# Patient Record
Sex: Female | Born: 1945 | Race: White | Hispanic: No | Smoking: Never smoker
Health system: Southern US, Community
[De-identification: ages and names within clinical notes are randomized; demographics above are authoritative.]

## PROBLEM LIST (undated history)

## (undated) DIAGNOSIS — I779 Disorder of arteries and arterioles, unspecified: Secondary | ICD-10-CM

## (undated) DIAGNOSIS — I2510 Atherosclerotic heart disease of native coronary artery without angina pectoris: Secondary | ICD-10-CM

## (undated) DIAGNOSIS — I10 Essential (primary) hypertension: Secondary | ICD-10-CM

## (undated) DIAGNOSIS — I639 Cerebral infarction, unspecified: Secondary | ICD-10-CM

## (undated) DIAGNOSIS — E119 Type 2 diabetes mellitus without complications: Secondary | ICD-10-CM

## (undated) DIAGNOSIS — E785 Hyperlipidemia, unspecified: Secondary | ICD-10-CM

## (undated) DIAGNOSIS — I480 Paroxysmal atrial fibrillation: Secondary | ICD-10-CM

## (undated) DIAGNOSIS — I739 Peripheral vascular disease, unspecified: Secondary | ICD-10-CM

## (undated) DIAGNOSIS — L989 Disorder of the skin and subcutaneous tissue, unspecified: Secondary | ICD-10-CM

## (undated) HISTORY — DX: Essential (primary) hypertension: I10

## (undated) HISTORY — PX: HERNIA REPAIR: SHX51

## (undated) HISTORY — DX: Atherosclerotic heart disease of native coronary artery without angina pectoris: I25.10

## (undated) HISTORY — PX: OTHER SURGICAL HISTORY: SHX169

## (undated) HISTORY — DX: Type 2 diabetes mellitus without complications: E11.9

## (undated) HISTORY — PX: CHOLECYSTECTOMY: SHX55

---

## 2002-10-29 ENCOUNTER — Encounter: Payer: Self-pay | Admitting: Neurological Surgery

## 2002-11-04 ENCOUNTER — Inpatient Hospital Stay (HOSPITAL_COMMUNITY): Admission: RE | Admit: 2002-11-04 | Discharge: 2002-11-14 | Payer: Self-pay | Admitting: Neurological Surgery

## 2002-11-04 ENCOUNTER — Encounter: Payer: Self-pay | Admitting: Neurological Surgery

## 2002-11-07 ENCOUNTER — Encounter: Payer: Self-pay | Admitting: Neurological Surgery

## 2002-11-11 ENCOUNTER — Encounter: Payer: Self-pay | Admitting: Neurological Surgery

## 2002-11-14 ENCOUNTER — Encounter: Payer: Self-pay | Admitting: Neurological Surgery

## 2002-12-30 ENCOUNTER — Ambulatory Visit (HOSPITAL_COMMUNITY): Admission: RE | Admit: 2002-12-30 | Discharge: 2002-12-30 | Payer: Self-pay | Admitting: Neurological Surgery

## 2002-12-30 ENCOUNTER — Encounter: Payer: Self-pay | Admitting: Neurological Surgery

## 2007-04-08 ENCOUNTER — Ambulatory Visit: Payer: Self-pay | Admitting: Cardiology

## 2007-07-24 ENCOUNTER — Ambulatory Visit: Payer: Self-pay | Admitting: Cardiology

## 2009-02-16 ENCOUNTER — Encounter (HOSPITAL_COMMUNITY): Admission: RE | Admit: 2009-02-16 | Discharge: 2009-04-14 | Payer: Self-pay | Admitting: Neurology

## 2009-04-27 ENCOUNTER — Inpatient Hospital Stay (HOSPITAL_COMMUNITY): Admission: EM | Admit: 2009-04-27 | Discharge: 2009-05-04 | Payer: Self-pay | Admitting: Emergency Medicine

## 2009-04-27 ENCOUNTER — Ambulatory Visit: Payer: Self-pay | Admitting: Critical Care Medicine

## 2009-04-27 ENCOUNTER — Encounter (INDEPENDENT_AMBULATORY_CARE_PROVIDER_SITE_OTHER): Payer: Self-pay | Admitting: Neurology

## 2009-04-27 ENCOUNTER — Ambulatory Visit: Payer: Self-pay | Admitting: Cardiology

## 2009-04-28 ENCOUNTER — Encounter (INDEPENDENT_AMBULATORY_CARE_PROVIDER_SITE_OTHER): Payer: Self-pay | Admitting: Neurology

## 2009-04-29 ENCOUNTER — Encounter (INDEPENDENT_AMBULATORY_CARE_PROVIDER_SITE_OTHER): Payer: Self-pay | Admitting: Neurology

## 2009-05-03 ENCOUNTER — Encounter (INDEPENDENT_AMBULATORY_CARE_PROVIDER_SITE_OTHER): Payer: Self-pay | Admitting: Neurology

## 2009-05-03 ENCOUNTER — Ambulatory Visit: Payer: Self-pay | Admitting: Physical Medicine & Rehabilitation

## 2009-05-04 ENCOUNTER — Inpatient Hospital Stay (HOSPITAL_COMMUNITY)
Admission: RE | Admit: 2009-05-04 | Discharge: 2009-05-14 | Payer: Self-pay | Admitting: Physical Medicine & Rehabilitation

## 2009-05-08 ENCOUNTER — Ambulatory Visit: Payer: Self-pay | Admitting: Physical Medicine & Rehabilitation

## 2009-06-14 ENCOUNTER — Ambulatory Visit: Payer: Self-pay | Admitting: Physical Medicine & Rehabilitation

## 2009-06-14 ENCOUNTER — Encounter
Admission: RE | Admit: 2009-06-14 | Discharge: 2009-09-08 | Payer: Self-pay | Admitting: Physical Medicine & Rehabilitation

## 2009-07-26 ENCOUNTER — Ambulatory Visit: Payer: Self-pay | Admitting: Physical Medicine & Rehabilitation

## 2009-10-14 ENCOUNTER — Encounter
Admission: RE | Admit: 2009-10-14 | Discharge: 2009-10-18 | Payer: Self-pay | Admitting: Physical Medicine & Rehabilitation

## 2009-10-18 ENCOUNTER — Ambulatory Visit: Payer: Self-pay | Admitting: Physical Medicine & Rehabilitation

## 2010-06-14 ENCOUNTER — Ambulatory Visit (HOSPITAL_BASED_OUTPATIENT_CLINIC_OR_DEPARTMENT_OTHER): Admission: RE | Admit: 2010-06-14 | Discharge: 2010-06-14 | Payer: Self-pay | Admitting: Orthopedic Surgery

## 2010-10-31 NOTE — Op Note (Signed)
  NAME:  Bianca White, ENGELHARDT NO.:  [id number]  MEDICAL RECORD NO.:  [id number]          PATIENT TYPE:  AMB  LOCATION:  [place]                          FACILITY:  [facility]  PHYSICIAN:  Daryll Brod, M.D.       DATE OF BIRTH:  1946-07-07  DATE OF PROCEDURE:  06/14/2010 DATE OF DISCHARGE:                              OPERATIVE REPORT   PREOPERATIVE DIAGNOSIS:  Gouty tophus degenerative arthritis, right thumb interphalangeal joint.  POSTOPERATIVE DIAGNOSIS:  Gouty tophus degenerative arthritis, right thumb interphalangeal joint.  OPERATION:  Excision of gouty tophus debridement distal interphalangeal joint, right thumb.  SURGEON:  Daryll Brod, MD  ANESTHESIA:  General.  ANESTHESIOLOGIST:  Portola Frederick, MD  HISTORY:  The patient is a 65 year old female with history of a large gouty tophus of the dorsal aspect of the interphalangeal joint of right thumb.  This is barely underneath the skin.  She is admitted for debridement prior to extruding through the skin becoming infected. Pre,peri, and postoperative course have been discussed along with risks and complications.  She is aware there is no guarantee with surgery, possibility of infection, recurrence injury to arteries, nerves, tendons, incomplete relief of symptoms, dystrophy.  In the preoperative area, the patient is seen.  The extremity marked by both the patient and surgeon.  Antibiotics given.  PROCEDURE:  The patient is brought to the operating room where a general anesthetic was carried out without difficulty.  She was prepped using ChloraPrep, supine position, right arm free.  A 3-minute dry time was allowed.  Time-out taken confirming the patient and procedure.  A curvilinear incision was made over the interphalangeal joint of the thumb, carried down through subcutaneous tissue.  Two large gouty tophi were immediately encountered, both radial and ulnar aspects.  With blunt and sharp dissection, these  were dissected free protecting the extensor tendon.  The joint was opened.  The erosions into the proximal phalanx were noted.  These were entirely debrided along with the joint. Specimens were then sent to Pathology.  Care was taken to protect the collateral ligaments on either side.  The 2 tophi measured approximately 1 cm to 1.5 cm diameter on each side.  The wound was copiously irrigated with saline.  The skin then closed with interrupted 5-0 Vicryl Rapide sutures.  A metacarpal block with 0.25% Marcaine without epinephrine was then given.  A sterile compressive dressing and splint to the finger was applied.  Upon inflation of the tourniquet, all fingers immediately pinked.  She was taken to the recovery room for observation in a satisfactory condition.  She will be discharged home to return to the [facility] in 1 week on Vicodin.          ______________________________ Daryll Brod, M.D.     GK/MEDQ  D:  06/14/2010  T:  06/14/2010  Job:  [id number]  cc:   [facility]  Electronically Signed by Daryll Brod M.D. on 10/31/2010 12:11:06 PM

## 2010-11-23 LAB — BASIC METABOLIC PANEL
BUN: 19 mg/dL (ref 6–23)
Chloride: 103 mEq/L (ref 96–112)
Creatinine, Ser: 1.09 mg/dL (ref 0.4–1.2)
GFR calc non Af Amer: 50 mL/min — ABNORMAL LOW (ref >60)
Glucose, Bld: 174 mg/dL — ABNORMAL HIGH (ref 70–99)

## 2010-11-23 LAB — GLUCOSE, CAPILLARY: Glucose-Capillary: 118 mg/dL — ABNORMAL HIGH (ref 70–99)

## 2010-12-16 LAB — GLUCOSE, CAPILLARY
Glucose-Capillary: 143 mg/dL — ABNORMAL HIGH (ref 70–99)
Glucose-Capillary: 148 mg/dL — ABNORMAL HIGH (ref 70–99)
Glucose-Capillary: 154 mg/dL — ABNORMAL HIGH (ref 70–99)
Glucose-Capillary: 164 mg/dL — ABNORMAL HIGH (ref 70–99)

## 2010-12-17 LAB — GLUCOSE, CAPILLARY
Glucose-Capillary: 103 mg/dL — ABNORMAL HIGH (ref 70–99)
Glucose-Capillary: 108 mg/dL — ABNORMAL HIGH (ref 70–99)
Glucose-Capillary: 122 mg/dL — ABNORMAL HIGH (ref 70–99)
Glucose-Capillary: 122 mg/dL — ABNORMAL HIGH (ref 70–99)
Glucose-Capillary: 129 mg/dL — ABNORMAL HIGH (ref 70–99)
Glucose-Capillary: 129 mg/dL — ABNORMAL HIGH (ref 70–99)
Glucose-Capillary: 130 mg/dL — ABNORMAL HIGH (ref 70–99)
Glucose-Capillary: 133 mg/dL — ABNORMAL HIGH (ref 70–99)
Glucose-Capillary: 138 mg/dL — ABNORMAL HIGH (ref 70–99)
Glucose-Capillary: 144 mg/dL — ABNORMAL HIGH (ref 70–99)
Glucose-Capillary: 149 mg/dL — ABNORMAL HIGH (ref 70–99)
Glucose-Capillary: 150 mg/dL — ABNORMAL HIGH (ref 70–99)
Glucose-Capillary: 150 mg/dL — ABNORMAL HIGH (ref 70–99)
Glucose-Capillary: 150 mg/dL — ABNORMAL HIGH (ref 70–99)
Glucose-Capillary: 151 mg/dL — ABNORMAL HIGH (ref 70–99)
Glucose-Capillary: 154 mg/dL — ABNORMAL HIGH (ref 70–99)
Glucose-Capillary: 162 mg/dL — ABNORMAL HIGH (ref 70–99)
Glucose-Capillary: 166 mg/dL — ABNORMAL HIGH (ref 70–99)
Glucose-Capillary: 170 mg/dL — ABNORMAL HIGH (ref 70–99)
Glucose-Capillary: 171 mg/dL — ABNORMAL HIGH (ref 70–99)
Glucose-Capillary: 173 mg/dL — ABNORMAL HIGH (ref 70–99)
Glucose-Capillary: 183 mg/dL — ABNORMAL HIGH (ref 70–99)
Glucose-Capillary: 196 mg/dL — ABNORMAL HIGH (ref 70–99)
Glucose-Capillary: 203 mg/dL — ABNORMAL HIGH (ref 70–99)
Glucose-Capillary: 206 mg/dL — ABNORMAL HIGH (ref 70–99)
Glucose-Capillary: 206 mg/dL — ABNORMAL HIGH (ref 70–99)
Glucose-Capillary: 208 mg/dL — ABNORMAL HIGH (ref 70–99)
Glucose-Capillary: 259 mg/dL — ABNORMAL HIGH (ref 70–99)
Glucose-Capillary: 72 mg/dL (ref 70–99)
Glucose-Capillary: 85 mg/dL (ref 70–99)

## 2010-12-17 LAB — COMPREHENSIVE METABOLIC PANEL
ALT: 22 U/L (ref 0–35)
ALT: 23 U/L (ref 0–35)
AST: 26 U/L (ref 0–37)
AST: 29 U/L (ref 0–37)
Albumin: 3.6 g/dL (ref 3.5–5.2)
BUN: 22 mg/dL (ref 6–23)
BUN: 8 mg/dL (ref 6–23)
CO2: 25 mEq/L (ref 19–32)
CO2: 29 mEq/L (ref 19–32)
Calcium: 8.7 mg/dL (ref 8.4–10.5)
Calcium: 9.1 mg/dL (ref 8.4–10.5)
Chloride: 109 mEq/L (ref 96–112)
Creatinine, Ser: 0.88 mg/dL (ref 0.4–1.2)
Creatinine, Ser: 1.02 mg/dL (ref 0.4–1.2)
GFR calc Af Amer: >60 mL/min (ref >60)
GFR calc Af Amer: >60 mL/min (ref >60)
GFR calc non Af Amer: 54 mL/min — ABNORMAL LOW (ref >60)
GFR calc non Af Amer: >60 mL/min (ref >60)
Glucose, Bld: 174 mg/dL — ABNORMAL HIGH (ref 70–99)
Glucose, Bld: 187 mg/dL — ABNORMAL HIGH (ref 70–99)
Sodium: 138 mEq/L (ref 135–145)
Sodium: 143 mEq/L (ref 135–145)
Total Bilirubin: 0.6 mg/dL (ref 0.3–1.2)
Total Protein: 6 g/dL (ref 6.0–8.3)
Total Protein: 6.4 g/dL (ref 6.0–8.3)

## 2010-12-17 LAB — CK TOTAL AND CKMB (NOT AT ARMC)
CK, MB: 2.6 ng/mL (ref 0.3–4.0)
Relative Index: 2.3 (ref 0.0–2.5)
Total CK: 113 U/L (ref 7–177)

## 2010-12-17 LAB — URINALYSIS, MICROSCOPIC ONLY
Bilirubin Urine: NEGATIVE
Glucose, UA: NEGATIVE mg/dL
Ketones, ur: NEGATIVE mg/dL
Protein, ur: 30 mg/dL — AB

## 2010-12-17 LAB — DIFFERENTIAL
Basophils Absolute: 0 10*3/uL (ref 0.0–0.1)
Basophils Absolute: 0 10*3/uL (ref 0.0–0.1)
Basophils Relative: 0 % (ref 0–1)
Eosinophils Absolute: 0.1 10*3/uL (ref 0.0–0.7)
Eosinophils Relative: 0 % (ref 0–5)
Eosinophils Relative: 1 % (ref 0–5)
Lymphocytes Relative: 10 % — ABNORMAL LOW (ref 12–46)
Lymphocytes Relative: 33 % (ref 12–46)
Lymphs Abs: 1.1 10*3/uL (ref 0.7–4.0)
Lymphs Abs: 1.7 10*3/uL (ref 0.7–4.0)
Lymphs Abs: 2.9 10*3/uL (ref 0.7–4.0)
Monocytes Absolute: 0.7 10*3/uL (ref 0.1–1.0)
Monocytes Relative: 7 % (ref 3–12)
Monocytes Relative: 9 % (ref 3–12)
Monocytes Relative: 9 % (ref 3–12)
Neutro Abs: 6.3 10*3/uL (ref 1.7–7.7)
Neutro Abs: 8.4 10*3/uL — ABNORMAL HIGH (ref 1.7–7.7)
Neutrophils Relative %: 71 % (ref 43–77)
Neutrophils Relative %: 78 % — ABNORMAL HIGH (ref 43–77)

## 2010-12-17 LAB — CBC
HCT: 28.1 % — ABNORMAL LOW (ref 36.0–46.0)
HCT: 30.3 % — ABNORMAL LOW (ref 36.0–46.0)
HCT: 32.1 % — ABNORMAL LOW (ref 36.0–46.0)
Hemoglobin: 10.3 g/dL — ABNORMAL LOW (ref 12.0–15.0)
Hemoglobin: 11 g/dL — ABNORMAL LOW (ref 12.0–15.0)
Hemoglobin: 9.8 g/dL — ABNORMAL LOW (ref 12.0–15.0)
Hemoglobin: 9.9 g/dL — ABNORMAL LOW (ref 12.0–15.0)
MCHC: 33.7 g/dL (ref 30.0–36.0)
MCHC: 34 g/dL (ref 30.0–36.0)
MCV: 91.1 fL (ref 78.0–100.0)
MCV: 91.4 fL (ref 78.0–100.0)
Platelets: 193 10*3/uL (ref 150–400)
Platelets: 267 10*3/uL (ref 150–400)
RBC: 3.07 MIL/uL — ABNORMAL LOW (ref 3.87–5.11)
RBC: 3.08 MIL/uL — ABNORMAL LOW (ref 3.87–5.11)
RBC: 3.52 MIL/uL — ABNORMAL LOW (ref 3.87–5.11)
RBC: 4.04 MIL/uL (ref 3.87–5.11)
RDW: 14.2 % (ref 11.5–15.5)
RDW: 14.5 % (ref 11.5–15.5)
WBC: 10.1 10*3/uL (ref 4.0–10.5)
WBC: 8.7 10*3/uL (ref 4.0–10.5)
WBC: 8.9 10*3/uL (ref 4.0–10.5)
WBC: 9.1 10*3/uL (ref 4.0–10.5)

## 2010-12-17 LAB — BLOOD GAS, ARTERIAL
Acid-base deficit: 0 mmol/L (ref 0.0–2.0)
Acid-base deficit: 1.3 mmol/L (ref 0.0–2.0)
Delivery systems: POSITIVE
Drawn by: 29757
O2 Saturation: 99.2 %
PEEP: 5 cmH2O
PEEP: 5 cmH2O
PEEP: 5 cmH2O
Patient temperature: 97.3
RATE: 14 resp/min
TCO2: 25.4 mmol/L (ref 0–100)
pCO2 arterial: 38.8 mmHg (ref 35.0–45.0)
pCO2 arterial: 40.4 mmHg (ref 35.0–45.0)
pH, Arterial: 7.39 (ref 7.350–7.400)
pH, Arterial: 7.395 (ref 7.350–7.400)
pO2, Arterial: 126 mmHg — ABNORMAL HIGH (ref 80.0–100.0)
pO2, Arterial: 164 mmHg — ABNORMAL HIGH (ref 80.0–100.0)
pO2, Arterial: 85.9 mmHg (ref 80.0–100.0)

## 2010-12-17 LAB — URINE MICROSCOPIC-ADD ON

## 2010-12-17 LAB — POCT I-STAT, CHEM 8
BUN: 27 mg/dL — ABNORMAL HIGH (ref 6–23)
Hemoglobin: 12.6 g/dL (ref 12.0–15.0)
Potassium: 3.6 mEq/L (ref 3.5–5.1)
Sodium: 141 mEq/L (ref 135–145)
TCO2: 25 mmol/L (ref 0–100)

## 2010-12-17 LAB — LIPID PANEL
LDL Cholesterol: 115 mg/dL — ABNORMAL HIGH (ref 0–99)
Total CHOL/HDL Ratio: 4.6 RATIO
VLDL: 28 mg/dL (ref 0–40)

## 2010-12-17 LAB — BASIC METABOLIC PANEL
BUN: 10 mg/dL (ref 6–23)
CO2: 25 mEq/L (ref 19–32)
Calcium: 7.4 mg/dL — ABNORMAL LOW (ref 8.4–10.5)
Calcium: 7.5 mg/dL — ABNORMAL LOW (ref 8.4–10.5)
Creatinine, Ser: 0.75 mg/dL (ref 0.4–1.2)
GFR calc Af Amer: >60 mL/min (ref >60)
GFR calc Af Amer: >60 mL/min (ref >60)
GFR calc non Af Amer: >60 mL/min (ref >60)
GFR calc non Af Amer: >60 mL/min (ref >60)
Glucose, Bld: 142 mg/dL — ABNORMAL HIGH (ref 70–99)
Potassium: 3.6 mEq/L (ref 3.5–5.1)
Potassium: 3.8 mEq/L (ref 3.5–5.1)
Sodium: 139 mEq/L (ref 135–145)
Sodium: 143 mEq/L (ref 135–145)

## 2010-12-17 LAB — URINALYSIS, ROUTINE W REFLEX MICROSCOPIC
Bilirubin Urine: NEGATIVE
Ketones, ur: NEGATIVE mg/dL
Leukocytes, UA: NEGATIVE
Protein, ur: 30 mg/dL — AB
Protein, ur: NEGATIVE mg/dL
Specific Gravity, Urine: 1.009 (ref 1.005–1.030)
Specific Gravity, Urine: 1.023 (ref 1.005–1.030)
Urobilinogen, UA: 0.2 mg/dL (ref 0.0–1.0)
Urobilinogen, UA: 0.2 mg/dL (ref 0.0–1.0)

## 2010-12-17 LAB — URINE CULTURE
Colony Count: >=100000
Colony Count: >=100000

## 2010-12-17 LAB — URIC ACID: Uric Acid, Serum: 7.8 mg/dL — ABNORMAL HIGH (ref 2.4–7.0)

## 2010-12-17 LAB — PROTIME-INR
INR: 1 (ref 0.00–1.49)
Prothrombin Time: 12.9 seconds (ref 11.6–15.2)
Prothrombin Time: 13.8 seconds (ref 11.6–15.2)

## 2010-12-17 LAB — TROPONIN I: Troponin I: 0.02 ng/mL (ref 0.00–0.06)

## 2010-12-17 LAB — HEMOCCULT GUIAC POC 1CARD (OFFICE)
Fecal Occult Bld: NEGATIVE
Fecal Occult Bld: NEGATIVE

## 2011-01-24 NOTE — Discharge Summary (Signed)
NAME:  Bianca White, Bianca White              ACCOUNT NO.:  [id number]   MEDICAL RECORD NO.:  [id number]          PATIENT TYPE:  INP   LOCATION:  [id number]                         FACILITY:  [facility]   PHYSICIAN:  Pramod P. Leonie Man, MD    DATE OF BIRTH:  [birth year]   DATE OF ADMISSION:  04/27/2009  DATE OF DISCHARGE:  05/04/2009                               DISCHARGE SUMMARY   DIAGNOSES AT THE TIME OF DISCHARGE:  1. 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  2. Dyslipidemia.  3. Hypertension.  4. Diabetes.  5. Obesity.  6. Gastroesophageal reflux disease.  7. Right leg pain without fracture.  8. History of stroke 2 years ago.  9. Vitamin D deficiency.  10.Cervical spine surgery.  11.Gallbladder resection.   MEDICINES AT TIME OF DISCHARGE:  1. Neurontin 300 mg q.a.m., 600 mg q.p.m.  2. Glipizide 50 mg a day.  3. Metformin 850 mg b.i.d.  4. Nexium 40 mg a day.  5. Januvia 100 mg a day.  6. Atenolol 50 mg a day.  7. Micardis 40/12.5 mg a day.  8. Lovenox 40 mg subcu a day.  9. Lyrica 75 mg b.i.d.  10.Plavix 75 mg a day.  11.Ultram 100 mg q.6 hours.  12.Ventolin inhaler q.i.d.  13.Zocor 20 mg a day.   STUDIES PERFORMED:  1. CT of the brain on admission shows ill-defined infarct in the right      frontal and left occipital lobe which may be chronic or subacute,      no hemorrhage.  2. Cerebral angiogram performed by Dr. Juliet Rude shows a complete      occlusion of the left middle cerebral artery and proximal M1      segment.  The patient with endovascular revascularization of the      occluded left MCA proximal M1 occlusion with complete opening with      some Penumbra re-perfusion thrombectomy device and 8 mg of super      select of intracranial left middle cerebral artery perfusion of      interarterial t-PA.  3. CT of the brain 24 hours post t-PA and angio shows evolving left      MCA infarct.  No intracranial hemorrhage.  4. MRI of the brain shows  acute/subacute infarct involving the left      insular cortex, anterior left temporal lobe, left lentiform      nucleus, and severe caudate nucleus.  Additional scattered foci      present in left frontal lobe and single focus seen in the posterior      right frontal lobe near the vertex remote encephalomalacia      involving the anterior right frontal lobe and undersurface of left      occipital lobe.  Extensive sinus disease.  Right foot x-ray shows      no acute abnormality.  Chest x-ray shows cardiomegaly and vascular      congestion with mild left mid lung zone atelectasis and this was      the last EKG performed.  5. Transesophageal echocardiogram shows EF of 55-65% with no  shunt, no      PFO, no source of embolus.  Two-D echocardiogram shows EF of 55-60%      with no source of embolus.  EKG shows sinus rhythm.   LABORATORY STUDIES:  CBC with hemoglobin 11.1 on admission, now 9.8,  hematocrit 32.1-28.1, white blood cells and rest of CBC normal.  Chemistry with glucose 142, otherwise normal.  Coags normal.  Liver  function tests normal.  Albumin 3.0.  Cardiac enzymes negative.  Cholesterol 183, triglycerides 142, HDL 40, LDL 115, calcium 7.5.  Urinalysis with 0-2 red blood cells, otherwise normal and homocystine  8.5, hemoglobin A1c 7.5.   HISTORY OF PRESENT ILLNESS:  Ms. Mhia Mathre is a 65 year old right-  handed Caucasian female with history of diabetes, obesity, hypertension  and prior cerebrovascular disease.  The patient sustained a stroke 2  years ago with some mild memory problems that was associated with  slurred speech.  The patient was in the [place] area to do a shopping  with her sister-in-law.  She just came out of the store, got into the  car and had sudden onset of mutism and right hemiparesis.  EMS was  called.  Onset of deficit was 1:45 p.m..  The patient was brought to the  emergency room somewhat sleepy and aphasic.  NIH stroke scale was 18.  CT scan of  the brain showed old infarcts in the right frontal and left  occipital area.  No acute changes were noted.  The patient was given two  third IV t-PA and sent to the angiogram table for further evaluation as  there was some question of dense left middle cerebral artery sign.  During the angiogram, she was found to have occluded left MCA occlusion.  The patient had complete revascularization with  Penumbra device and  intra-arterial t-PA.  She was admitted to the neuro ICU for further  evaluation.   HOSPITAL COURSE:  The patient was initially intubated secondary to  revascularization procedure.  She was left intubated for 24 hours for  transesophageal echocardiogram.  TEE was performed for source of embolic  stroke though TEE normal and no source of embolic stroke found during  her hospitalization.  Once TEE was performed, she was extubated and  tolerated that well.  She was transferred to the floor after 24 hours of  ICU monitoring.  She did have some significant dysphagia initially but  was able to tolerate a dysphagia III thin liquid diet.  She was on  aspirin prior to admission and changed to Plavix for secondary stroke  prevention.  She was added Zocor for mild dyslipidemia.  She has some  complaints of some right leg pain.  X-rays were normal.  The patient was  started on Lyrica to assist with pain.  She was also put on Ultram  around the clock.  PT and OT evaluated her and felt she would benefit  from inpatient rehab.  Arrangements were made to go there and the  patient was transferred.   CONDITION ON DISCHARGE:  The patient alert and oriented x3.  Mild  expressive aphasia, follows commands quite well.  Her eye movements are  full.  Her face is symmetric.  Her tongue is midline.  She moves all  four extremities.  Her heart rate is regular.  Her breath sounds are  clear.   DISCHARGE PLAN:  1. Discharge to rehab for ongoing PT, OT and Speech Therapy.  2. Plavix for secondary  stroke prevention.  3. New  Zocor for dyslipidemia.  Follow up in 4-6 weeks.  4. Follow up primary care physician within 4-6 weeks.  5. Follow up Dr. Antony Contras in 2-3 months.      Burnetta Sabin, N.P.    ______________________________  Kathie Rhodes. Leonie Man, MD    SB/MEDQ  D:  05/04/2009  T:  05/05/2009  Job:  [id number]   cc:   L. Donnie Coffin, M.D.

## 2011-01-24 NOTE — H&P (Signed)
NAME:  Bianca White, Bianca White NO.:  [id number]   MEDICAL RECORD NO.:  [id number]          PATIENT TYPE:  IPS   LOCATION:  4001                         FACILITY:  [facility]   PHYSICIAN:  Meredith Staggers, M.D.DATE OF BIRTH:  June 12, 1946   DATE OF ADMISSION:  05/04/2009  DATE OF DISCHARGE:                              HISTORY & PHYSICAL   CHIEF COMPLAINT:  Right-sided weakness and aphasia.   PRIMARY PHYSICIAN:  Sherril Cong, MD   NEUROLOGISTS:  Jill Alexanders, MD and Pramod P. Leonie Man, MD   HISTORY OF PRESENT ILLNESS:  This is a 65 year old white female with  diabetes admitted on April 27, 2009 with sudden onset of speech  difficulties and right-sided weakness.  She was treated with t-PA and  underwent cerebral angio and revascularization of occluded left MCA with  mechanical thrombectomy the same day.  TEE was negative.  MRI and MRA of  the brain showed areas of subacute infarct in left insular and left  temporal frontal lobes with a single focus on the right frontal lobe.  The patient has had some improvement in her language skills.  She  continues to have some difficulty with weakness, however.  She was  placed on a D3 thin liquid diet for problems with swallowing.  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  She  was then placed on Plavix for stroke prophylaxis.  The patient also had  some pain in both her feet right greater than left.  X-rays of her left  foot notable for a calcaneal spur.  Right ankle x-ray was negative.  Rehab evaluated the patient yesterday and felt that she could benefit  from an inpatient rehab admission and she was brought today.   REVIEW OF SYSTEMS:  Notable for reflux, low back pain, anxiety, and  weakness.  The patient denies any past history of foot pain.  Other  pertinent positives are above.  Full 14 point review is in the written  H&P.   PAST MEDICAL HISTORY:  Positive for:  1. Type 2 diabetes.  2.  Prior stroke in 2008 with transient speech and memory dysfunction.  3. ACDF.  4. Cholecystectomy.  5. GERD.  6. Vitamin D deficiency.  7. Chronic low back pain with radiculopathy.  8. Obesity.  9. Orthopnea.  10.Peripheral neuropathy.  11.Abdominal pain secondary to radiculopathy.   FAMILY HISTORY:  Positive for stroke.   SOCIAL HISTORY:  The patient is married, lives in [place], one-level  house one step to enter.  She works part-time as a Theme park manager and does  not smoke or drink.  Her husband works in [place].  She has a local  family who can help at home after discharge.   ALLERGIES:  SULFA.   HOME MEDICATIONS:  Aspirin, Januvia, metformin, atenolol, Micardis,  hydrochlorothiazide, Nexium, Ultram p.r.n., vitamin D, Neurontin,  glipizide and lidocaine patch p.r.n.   LABORATORY DATA:  Hemoglobin 9.8, white count 9.1, platelets 199.  Sodium 139, potassium 3.6, BUN 8, creatinine 0.72.   PHYSICAL EXAMINATION:  VITAL SIGNS:  Blood pressure is 118/64, pulse 66,  respiratory rate 18, temperature 98.3.  GENERAL:  The patient is pleasant, alert, and oriented x3.  Affect is  bright and appropriate.  EAR, NOSE AND THROAT:  Unremarkable today.  Mucosa is pink and moist.  NECK:  Supple without JVD or lymphadenopathy.  CHEST:  Notable for occasional rhonchi but otherwise clear.  HEART:  Regular rate and rhythm without murmur, rubs or gallops.  EXTREMITIES:  No clubbing, cyanosis and trace edema in the feet.  ABDOMEN:  Soft, nontender.  Bowel sounds are positive.  NEUROLOGIC:  Cranial nerves showed a right central VII.  Tongue was  deviated to the right slightly.  Speech was fairly clear but is aphasic  and she has a hard time putting more than a few words or phrases  together at one point.  Speech is definitely broken.  She has expressive  more than receptive difficulties as of this exam today.  She is able to  communicate thoughts with extra time as long as  she does not become  too  frustrated.  Reflexes are generally 1+.  Sensation decreased to pinprick  and light touch in the right greater than left side today.  I grade that  deficit at 1/2.  Strength in the upper extremities is 4/5 in the right,  4-4+/5 on the left.  Right lower extremity strength is 1+/5 proximal  distal with some pain inhibition and 1-2/5 on the left proximal distal  with again some pain issues there as well.  Judgment was fair.  The  patient had fair orientation today and was able to tell me the place and  reason why she was here as well as her name.  Memory was generally  appropriate for short-term information.  Affect was appropriate with  occasional anxiety noted.  MUSCULOSKELETAL:  Notable for left heel pain with palpation as well as  pain over the right lateral malleolus with some associated swelling and  warmth.  No frank redness is appreciated.  She was minimally tender over  the sole of the foot and the medial malleolus of the right ankle.  Pain  was worse with passive range in both feet today.   POST ADMISSION PHYSICIAN EVALUATION:  1. Functional deficits secondary to embolic left MCA stroke.  The      patient with right hemiparesis and aphasia.  2. The patient was admitted to receive collaborative interdisciplinary      care between the physiatrist, rehab nursing staff and therapy team.  3. The patient's level of medical complexity and substantial therapy      needs in context of that medical necessity cannot be provided at a      lesser intensity of care.  4. The patient has experienced substantial functional loss from her      baseline.  Upon functional assessment at the time of preadmission      screening, the patient was mod to max assist for transfers and able      to sit at the edge of bed for a few minutes at max assist.  She is      total assist with ADLs.  As of today's therapy evaluations not much      had changed from a functional aspect as she remains max  sometimes      total assistance.  Judging by the patient's diagnosis, physical      exam and functional history, she has a potential for functional      progress which will result in measurable gains  while in inpatient      rehab.  These gains will be of substantial and practical use upon      discharge to home in facilitating mobility and self-care.  Interim      changes in medical status since preadmission screening are detailed      above.  5. Physiatrist will provide 24-hour management of medical needs, as      well as oversight of the therapy plan/treatment and provide      guidance as appropriate regarding interaction of the two.  Medical      problem list and plan are listed below.  A 24-hour rehab nursing      staff will assist in management of the patient's nutritional needs,      as well as pain medication administration, bowel and bladder      function and integration of therapy concepts and techniques.  6. PT will assess and treat for lower extremity strength, range of      motion, neuromuscular reeducation, adaptive techniques and      equipment, family education and safety, goals supervision to min      assist.  7. OT will assess and treat for upper extremity use and ADLs, as well      as neuromuscular reeducation, cognitive perceptual training, safety      awareness and education with goals supervision to min assist.  8. Speech language pathology will assess and treat for language and      swallowing deficits with goals supervision to modified independent.  9. Case management and social worker will assess and treat for      psychosocial issues and discharge planning.  10.Team conferences will be held weekly to assess progress towards      goals and to determine barriers at discharge.  11.The patient has demonstrated sufficient medical stability and an      exercise capacity to tolerate at least 3 hours of therapy per day      at least 5 days per week.  12.Estimated  length of stay is 3 weeks.  Prognosis fair to good.   MEDICAL PROBLEM LIST AND PLAN:  1. Diabetes type 2:  Resume Glucophage and Januvia and follow with      CBGs closely watching for tolerance of therapies and dietary      changes.  2. Hypertension:  Benicar and hydrochlorothiazide on board with good      control of blood pressure at this point.  We will follow for      fluctuation with activity.  3. Hypoxia:  Check x-ray and O2 sats.  Oxygen supplementation for      desaturation less than 90%.  4. Pain management:  Neurontin for home regimen.  I think that      certainly there is osteoarthritis and osteophytes in the left heel      concordant with the patient's pain.  Right ankle certainly could be      gouty arthritis.  Would empirically treat for gout with colchicine      tonight.  Check uric acid level in the morning and follow for      progress.  Want to stay away from prednisone due to diabetic      history.  Use Ultram for pain control.  Lyrica has also been      initiated.  5. Deep vein thrombosis prophylaxis.  Lovenox 40 mg subcutaneous      daily.  Follow platelets to look for any signs and  symptoms of      bleeding.  6. Stroke prophylaxis.  Plavix 75 mg p.o. daily.  Again, watch for      bleeding complications.      Meredith Staggers, M.D.  Electronically Signed     ZTS/MEDQ  D:  05/04/2009  T:  05/05/2009  Job:  [id number]   cc:   Sherril Cong, MD  C. Floyde Parkins, M.D.  Pramod P. Leonie Man, MD

## 2011-01-24 NOTE — H&P (Signed)
NAME:  White, Bianca NO.:  [id number]   MEDICAL RECORD NO.:  [id number]          PATIENT TYPE:  INP   LOCATION:  3113                         FACILITY:  [facility]   PHYSICIAN:  Jill Alexanders, M.D.  DATE OF BIRTH:  1945/11/09   DATE OF ADMISSION:  04/27/2009  DATE OF DISCHARGE:                              HISTORY & PHYSICAL   HISTORY OF PRESENT ILLNESS:  Bianca White is a 65 year old right-  handed white female born, [birth year], with a history of diabetes,  marked obesity, hypertension, and prior cerebrovascular disease.  The  patient sustained a stroke 2 years ago with some mild memory problems  and was associated with some slurred speech.  The patient was in the  [place] area, shopping with her sister-in-law today.  The patient  just came out of the store, got into the car, had sudden onset of mutism  and right hemiparesis.  EMS was called.  Onset of deficit was at 1:45  p.m. today.  The patient was brought to the ER, was somewhat sleepy, and  aphasic.  NIH stroke scale score was 18.  CT scan of the brain shows old  infarcts in the right frontal and left occipital area.  No acute changes  were seen.  The patient was set up for two-thirds t-PA and sent to the  angiographic table for further evaluation.  There was some question  whether the patient has a dense left middle cerebral artery sign.   PAST MEDICAL HISTORY:  Significant for,  1. History of obesity.  2. Diabetes.  3. Hypertension.  4. Gastroesophageal reflux disease.  5. Vitamin D deficiency.  6. Cervical spine surgery.  7. Gallbladder resection.  8. Stroke 2 years ago.  9. Left middle cerebral distribution stroke on this evaluation.   MEDICATIONS:  At this time include,  1. Januvia 100 mg daily.  2. Metformin 850 mg 1 twice daily.  3. Atenolol 50 mg 1 daily.  4. Micardis HCT 40/12.5 one tablet daily.  5. Prednisone Dosepak that she has been on recently.  6. Nexium 40 mg daily.  7.  Vitamin D 50,000 units once a week.  8. Ultram 50 mg 1 every 6 hours if needed for pain.  9. Lidoderm patch if needed.  10.Gabapentin 300 mg 1 in the morning, 2 in the evening.  11.Glipizide 5 mg ER tablet 1 tablet twice daily.   The patient again does not smoke or drink, has an allergy to Mantoloking.   SOCIAL HISTORY:  This patient is married, lives in the [place], [place] area, works part-time as a Theme park manager.  The patient has 4  children.   FAMILY MEDICAL HISTORY:  Father passed away of unknown cause.  Mother  died with a stroke.  The patient has a half-brother with spine disease  and has 2 sisters who are in relatively good health.   REVIEW OF SYSTEMS:  Cannot be obtained.  The patient did complain of  some fatigue prior to the onset of the stroke today.   PHYSICAL EXAMINATION:  VITALS:  Blood pressure is 108/80,  heart rate 75,  respiratory rate 14, afebrile.  GENERAL:  The patient is a markedly obese white female who is sleepy,  but can be aroused at the time of examination.  HEENT:  Head is atraumatic.  Eyes, pupils are round and reactive to  light.  Patient has slight left gaze preference, but easily can bring  the eyes to midline.  The patient has decreased blink to threat from the  right as compared to the left.  NECK:  Supple.  No carotid bruits noted.  RESPIRATORY:  Clear.  CARDIOVASCULAR:  Distant heart sounds.  No obvious murmurs or rubs  noted.  EXTREMITIES:  Without significant edema.  The patient is markedly obese.  ABDOMEN:  Positive bowel sounds.  No organomegaly or tenderness noted.  NEUROLOGIC:  No clear facial asymmetry.  The patient will grimace  slightly to nasopharyngeal stimulation.  The patient has flaccid right  upper extremity.  No voluntary movement seen.  The patient has no drift  to the left arm, both legs drift to the bed, but the patient moves the  left side better than the right.  Very minimal pain response is seen on  the right,  better on the left leg.  Patient is unable to perform  cerebellar testing maneuvers.  The patient cannot be ambulated.  The  patient does not follow verbal commands.  Again, the patient is mute.  Deep tendon reflexes depressed, but symmetric.  Toes are neutral  bilaterally.   LABORATORY DATA:  Notable for white count of 8.7, hemoglobin of 12.4,  hematocrit of 36.8, MCV of 91.1, platelets of 267.  Capillary glucose of  72, sodium 141, potassium 3.6, chloride of 106, CO2 of 29, glucose of  82, creatinine of 1.1, alk phosphatase 57, SGOT of 29, SGPT 23, total  protein 6.4, albumin of 3.6, calcium 8.7, CK of 113, MB fraction of 2.6,  troponin I 0.02.   IMPRESSION:  1. Left middle cerebral artery distribution stroke event.  2. Diabetes.  3. Hypertension.  4. Marked obesity.   This patient has had very sudden onset of stroke event involving the  left brain.  The patient has had bihemispheric strokes previously and do  need to consider the possibility of an embolic/cardiogenic stroke  source.  The patient will be given two-thirds t-PA at this point and  sent to the angiographic table for further evaluation.  The patient may  have a dense middle cerebral artery sign on the left by CT.  The patient  has no known history of significant cardiac disease or atrial  fibrillation.  The patient will be admitted to the intensive care unit  and undergo rest of her stroke workup to include 2D echocardiogram and  MRI of the brain.  We will follow the patient's course closely.     Jill Alexanders, M.D.  Electronically Signed    CKW/MEDQ  D:  04/27/2009  T:  04/28/2009  Job:  [id number]   cc:   L. Donnie Coffin, M.D.

## 2011-01-27 NOTE — Discharge Summary (Signed)
NAME:  Bianca White, Bianca White                        ACCOUNT NO.:  [id number]   MEDICAL RECORD NO.:  [id number]                   PATIENT TYPE:  INP   LOCATION:  3034                                 FACILITY:  [facility]   PHYSICIAN:  Earleen Newport, M.D.               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   DISCHARGE AND FINAL DIAGNOSES:  1. 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  2. Spinal cord contusion.  3. New diagnosis of diabetes mellitus.   CONSULTATIONS:  Donnie Coffin, M.D.   HOSPITAL COURSE:  The patient is a 65 year old individual who was admitted  to the hospital on 11/04/2002 for elective surgical decompression of her  cervical spine at the C6-7 and C7-T1 levels secondary to spondylosis.  The  patient had a new diagnosis of diabetes mellitus made during the  preoperative laboratory studies demonstrating her blood sugars initially at  270 and subsequently at 170.  Nonetheless, because she was stable otherwise,  she was taken to the operating room.  As the patient was being placed onto  the operating table and being adjusted appropriately, the table gave way,  causing the patient to fall backwards over the head of the bed, landing onto  the floor.  She landed on her shoulders and the back of her head and  initially could not move her lower extremities.  It became apparent soon  thereafter that the patient was likely experiencing a spinal cord contusion,  and after careful evaluation, CT scanning, and x-ray and MRI of the cervical  spine, it was demonstrated that she had enlarged the disk herniation at the  C7-T1 level in addition to undergoing some soft tissue injury posteriorly,  splaying the C7-T1 spinous processes.  The patient was found to have  significant dysesthetic sensations in the proximal arms in addition to  weakness in the intrinsic musculature.  She was treated with high-dose  Decadron briefly.  Because of her diabetes, Hospitalist consult was obtained  as the patient really did not have any primary care physicians following  her.  She was stabilized and allowed to defervesce from a neurologic status  gradually over several days.  Her dysesthetic sensations in the proximal  arms seemed to improve; however, she continued to complain bitterly of pain  in the region of the shoulders.   She was ultimately taken to the operating room on 11/11/2002 where she  underwent the two-level anterior diskectomy and arthrodesis with structural  allograft and fixation.  The patient was, indeed, found to have an acute  disk disruption with acute fractured fragment of disk in the canal at the C7-  T1 level on the left-hand side.  The postoperative  course revealed that the  patient's shoulder pain did improve marginally.  She was able to tolerate  oral pain medications, and she was mobilized gradually.  Swallowing  continued to improve, and by the morning of 11/14/2002, the patient is  tolerating modest amounts of pain medication.  She has been started on  Neurontin and has been maintained on this medication to help with the  dysesthetic sensations that remain in the shoulder.  Her incision is clean  and dry.  The surgical drain that was placed at the time of surgery was  removed on March 3.   She has been relatively hypotensive, and this has caused her medications for  blood pressure to be held.  She was initially on Micardis as an outpatient  in addition to atenolol.  She was taking 50 mg a day of that and 80 mg of  Micardis with 12.5 mg.  She is additionally taking Protonix.  She has been  advised to continue on the Protonix for the current time in addition to  Neurontin 100 mg 3 times a day.  She was given a prescription for Percocet  #40  without refills as needed for pain.  She was also given a prescription  for Xanax 0.5 mg, #30, as needed for muscle spasm and/or sleep.   The patient has had diabetic teaching and is to be checking her blood sugars  regularly.  She will be seen by Dr. Donnie Coffin for further medical  followup.                                               Earleen Newport, M.D.    Drucilla Schmidt  D:  11/14/2002  T:  11/14/2002  Job:  [id number]   cc:   L. Donnie Coffin, M.D.  [address]  [place] [address]  Fax: [phone]

## 2011-01-27 NOTE — H&P (Signed)
NAME:  Bianca White, Bianca White                        ACCOUNT NO.:  [id number]   MEDICAL RECORD NO.:  [id number]                   PATIENT TYPE:  INP   LOCATION:  2890                                 FACILITY:  [facility]   PHYSICIAN:  Earleen Newport, M.D.               DATE OF BIRTH:  1945/11/02   DATE OF ADMISSION:  11/04/2002  DATE OF DISCHARGE:                                HISTORY & PHYSICAL   ADMISSION DIAGNOSES:  1. Spinal cord contusion.  2. New diagnosis diabetes mellitus.   HISTORY OF PRESENT ILLNESS:  The patient is a 65 year old individual who was  being admitted to undergo surgical decompression and arthrodesis at the C6-7  and C7-T1 level.  I initially had seen her on September 26, 2002 at which time  she was complaining of neck, shoulder, and left arm pain since November of  the past year.  An MRI demonstrated that she had severe spondylitic disease  and degeneration at the C6-7 and C7-T1 levels.  Because she was failing to  get any significant relief I advised a two-level anterior diskectomy and  arthrodesis.  On her outpatient preoperative laboratory studies it was noted  that her glucose was 250.  Subsequently her glucose was 170 on the day of  admission.  We took her back to the operating room at which time she was  placed on the operating table.  While she was being positioned appropriately  on the operating table, the head of the table gave way, thus lowering her  head and her upper torso down to the ground, causing her to slide and fall  backwards tail-over-head off of the table onto the floor.  She struck her  head and her shoulders and the upper extremities, and landed with her feet  facing opposite the operating table.  She initially complained that she  could not move her lower extremities.  My initial assessment - perhaps two  or three minutes after this had occurred - demonstrated that she had  sensation around her trunk and her lower extremities.  The patient was  able  to wiggle her toes; her arms were moving.  However, the patient suddenly  complained of severe pain in the region of her neck and shoulders,  particularly in the region of the right arm.  Any movement of her right arm  caused severe pain.  I did not notice any bruising on the arms but she noted  that the pain was electrical-like in nature and also felt as though there  were millions of bees stinging on her arms and shoulders.  We then carefully  rolled the patient onto her side, placed a spine board underneath her, and  rolled her onto the spine board and then fireman-carried her onto a  stretcher and I removed the spine board.  She was then taken to the post  anesthesia care unit where vital signs were noted to  be stable.  The patient  was then taken to the x-ray department for cervical spine films.  She noted  that she had some modest discomfort in the base of her neck and pain x-rays  of the neck revealed to the level of C5 that there was no evidence of a  fracture dislocation.  X-rays of the shoulder were within the limits of  normal, as was an x-ray of the right humerus.  Because the cervical spine  could not be fully evaluated a CT scan of the cervical spine was performed  which demonstrated that there was no evidence of any acute fracture but the  spondylitic processes at C6-7 and 7-1 were again noted.  A CT scan of the  brain and the head was performed which was within the limits of normal.  A  CT scan of the C spine demonstrated the spondylitic changes but no evidence  of fracture as noted above.  She continued to complain of significant  dysesthesias lasting over an hour after the incident.  Because of her new  diagnosis of diabetes I was reluctant to give her some steroid medication;  however, because of the persistence of symptoms we treated her with 10 mg of  Decadron.  Neurologically, she appears to be moving her upper extremities  quite well and I cannot detect a focal  deficit motor-wise at this time.  An  MRI of the cervical spine will be completed to see if there is any evidence  of contusion that can be observed on the scan.  In the meantime, the patient  will be seen by the hospitalists to determine what the appropriate treatment  for her diabetes will be.  I written a sliding scale insulin order, as we  have given her the Decadron at this time.  The patient also had some  antihypertensive medications.  Because of the suspicion that she likely has  a spinal cord contusion she is being admitted to the intensive care unit for  further observation.  I did note on my evaluation of the operating table  itself that there were several chipped and bent teeth in a portion of the  device that is responsible for holding the upper torso portion in a stable  position.  This position is normally movable on most operating tables to  allow for flexion or extension to position the upper torso appropriately.   PHYSICAL EXAMINATION AT THIS TIME:  VITAL SIGNS:  Blood pressure 170/90,  heart rate 99, respiratory rate 16.  NEUROLOGIC:  She was alert and oriented.  Her pupils are 4 mm, briskly  reactive to light and accomodation.  The extraocular movements are full.  There is tenderness on palpation of the vertex of the scalp and there is a  small boggy area with some edema noted in the scalp around the tender area  at the vertex.  The neck demonstrates pain when the area of the cervical  thoracic junction is palpated.  There is discrete painful sensation in a  cape-like distrubution around the shoulders down to the mid portion of the  arms, particularly on the medial aspect of the arms into the region of the  armpit bilaterally.  Sensation otherwise distally and on the trunk is within  the limits of normal including distal lower extremities at this time.  Motor  function distally reveals that iliopsoas, quads, tibialis anterior, and gastrocs all have 4/5 motion that can  be demonstrated while the patient is  in bed.  Her grips are 4/5 bilaterally.  Intrinsic function appears to be  4/5 also.  Biceps and triceps function appears intact though she has some  marked giveaway on testing of the triceps, suggesting perhaps evidence of  some modest triceps weakness.  Difficulty with the overlay of pain is noted  on testing this particular muscle group.  GENERAL:  Reveals that there are some bruises about the knees.  There is an  ecchymotic area on the left knee and there is a small bruise and contusion  measuring less than 2 cm in maximum diameter on the right knee.  No other  bruises are noted on the legs.  ABDOMEN:  Soft.  Bowel sounds are positive.  No masses are palpable.  LUNGS:  Clear to auscultation.  HEART:  Regular rate and rhythm.   IMPRESSION:  The patient has evidence of newly-suspected spinal cord injury  at the C6-7 or C7-T1 level.  An MRI is now to be performed.  She will be  admitted to the hospital for observation and we will obtain a consult with  the [facility] for initial treatment of diabetes mellitus.  She  will be placed on some steroid medication and observed closely.                                               Earleen Newport, M.D.    Drucilla Schmidt  D:  11/04/2002  T:  11/04/2002  Job:  [id number]

## 2011-01-27 NOTE — Op Note (Signed)
NAME:  Bianca White, Bianca White                        ACCOUNT NO.:  [id number]   MEDICAL RECORD NO.:  [id number]                   PATIENT TYPE:  INP   LOCATION:  3104                                 FACILITY:  [facility]   PHYSICIAN:  Earleen Newport, M.D.               DATE OF BIRTH:  1945-12-30   DATE OF PROCEDURE:  11/04/2002  DATE OF DISCHARGE:                                 OPERATIVE REPORT   PREOPERATIVE DIAGNOSIS:  C6-7, C7-T1 spondylosis plus herniated nucleus  pulposus with cervical radiculopathy and myelopathy.   POSTOPERATIVE DIAGNOSIS:  C6-7, C7-T1 spondylosis plus herniated nucleus  pulposus with cervical radiculopathy and myelopathy.   PROCEDURE:  Anterior cervical diskectomy and decompression of C6-7 and C7-T1  arthrodesis with structural allograft, stabilization with Synthes plate  fixation.   SURGEON:  Earleen Newport, M.D.   ASSISTANT:  Elizabeth Sauer, M.D.   ANESTHESIA:  General endotracheal.   INDICATIONS FOR PROCEDURE:  The patient is a 65 year old individual who has  had significant radicular problems and was to have an anterior decompression  at the two levels involved a week ago.  An incident occurred while moving  the patient to the operating table which exacerbated her condition and  caused some myelopathic changes with evidence of burning dysesthetic  sensation in the left shoulder and arm. An MRI demonstrates that the disk  herniation that had been present at the C6-7 level had enlarged particularly  with suggestion of a left-sided fragment.  Her left-sided symptoms were  worse.  She was taken to the operating room to undergo surgical  decompression at the current time.   DESCRIPTION OF PROCEDURE:  The patient was brought to the operating room and  placed on the table in the supine position.  After the induction of general  endotracheal anesthesia, she was placed in 5 pounds of Holter traction with  care being taken to position her neck neutrally with a  slight bit of  extension.  The neck was then prepped with Duraprep and draped in a sterile  fashion.  A transverse incision was made on the base of the left side of the  neck and this was carried down through the platysma.  The plane between the  sternocleidomastoid and the strap muscles was then dissected bluntly until  the prevertebral space was reached.  The first identifiable disk space was  noted to be C5-6 radiographically.  Dissection was then carried out  inferiorly to expose C6-7 and then C7 was exposed by further dissection in  the soft tissues to adequately retract the esophagus and strip the longus  coli muscle.  The longus coli was stripped from either side.  Large ventral  osteophytes were encountered on the ventral aspects of the C6-7 interspace  and these were taken down the Leksell rongeur.  Then by placing a Caspar  retractor into the wound, diskectomy could be performed by opening the  ventral aspect of the disk space and evacuating a significant quantity of  severely degenerated and spondylitic material from the C6-7 interspace.  A  set of 3-0 curets was used to remote the endplates adequately and remove  degenerated disk material from the endplates.  Once the posterior  longitudinal ligament was reached, there was noted to be significant  osteophytic overgrowth on the anterior margin of body of C6 and the superior  margin of body of C7. After the uncinate processes.  These were dissected  down and a large lateral bone spur from the uncinate process was removed on  the right side first and then on the left side a similar procedure was  carried out.  In the end the common dural tube and the takeoff of the C7  nerve roots were skeletonized and well dissected free. The interspace was  then checked for hemostasis in the soft tissues in the epidural space with  some small pledgets of Gelfoam soaked in thrombin which were later removed  and irrigated away.  Attention was then  turned to C7-T1.  Here, because of  the angle of the vertebral endplate away from the opening, further  dissection inferiorly was needed to be undertaken so as to allow for  exposure of the C7-T1 space.  Once this was successfully achieved, the  Caspar retractor was again placed under the longus coli muscle.  The C7  ventral aspect was also covered with a large bony osteophyte. This was  removed with the Leksell rongeur and the disk space was then entered.  A  combination of curets and rongeurs was used to evacuate the disk space of a  significant quantity of severely degenerated disk material.  As the disk  space was cleared, again osteophytes were encountered on the deepest aspect  of the interspace at C7 and T1.  With this being evacuated, the lateral  recesses were cleared and on the left side, there was encountered a large  free fragment of disk material just beyond the confines of the posterior  longitudinal ligament.  This appeared to have a portion of endplate  associated with it and was noted to be somewhat hemorrhagic which I believe  corresponds with the findings on the MRI scan of the more acute disk  herniation at C7-T1 on the left side. Once this was evacuated, the common  dural tube and the nerve root could easily be identified and explored out  laterally.  This was well decompressed.  Similar attention was then carried  out on the right side where the right C8 nerve root was similarly  skeletonized and decompressed.  With the decompression thus being obtained,  hemostasis in the soft tissues was obtained. An 8 mm round femoral graft  with a whole in the center that was filled with the patient's autologous  bone was then placed into the interspace.  This was a Nuvasive bone graft.  At C6-7 a 7 mm graft was filled with the patient's autologous bone and  placed into the interspace. Retraction was then removed. The neck was felt to be rather neutrally placed and then ventral  aspect was measured for an  appropriate plate.  A standard size 40 mm Synthes plate was then contoured  to the prevertebral space and placed in the direction with the superior  screws being placed inferiorly into the body of T1. Six locking 4 x 16 mm  screws were placed into the vertebral bodies of C6, C7, and T1.  Hemostasis  was achieved in the soft tissues.  A localizing radiograph identified the  superior portion of the plate well placed in front of the body of C6. Then  after copious irrigation with antibiotic irrigating solution, hemostasis was  checked carefully in the prevertebral tissues. A small Terrial Rhodes  drain  was then placed into the wound and brought out through a separate stab  incision. The platysma was then closed with 3-0 Vicryl interrupted fashion  and 3-0 Vicryl was used in the subcuticular tissues.  During this procedure,  Dr. Carloyn Manner helped by providing adequate retraction of the tissues while I  worked doing the decompression, stabilization, and plate fixation.                                               Earleen Newport, M.D.    Drucilla Schmidt  D:  11/11/2002  T:  11/11/2002  Job:  [id number]

## 2012-11-21 ENCOUNTER — Encounter: Payer: Self-pay | Admitting: Physician Assistant

## 2012-11-22 ENCOUNTER — Other Ambulatory Visit: Payer: Self-pay | Admitting: Physician Assistant

## 2012-11-22 ENCOUNTER — Encounter: Payer: Self-pay | Admitting: Physician Assistant

## 2012-11-22 ENCOUNTER — Inpatient Hospital Stay (HOSPITAL_COMMUNITY)
Admission: AD | Admit: 2012-11-22 | Discharge: 2012-11-26 | DRG: 247 | Disposition: A | Discharge status: Home / Self Care | Payer: Medicare Other | Source: Other Acute Inpatient Hospital | Attending: Internal Medicine | Admitting: Internal Medicine

## 2012-11-22 DIAGNOSIS — I480 Paroxysmal atrial fibrillation: Secondary | ICD-10-CM

## 2012-11-22 DIAGNOSIS — Z7902 Long term (current) use of antithrombotics/antiplatelets: Secondary | ICD-10-CM

## 2012-11-22 DIAGNOSIS — Z79899 Other long term (current) drug therapy: Secondary | ICD-10-CM

## 2012-11-22 DIAGNOSIS — R9439 Abnormal result of other cardiovascular function study: Secondary | ICD-10-CM

## 2012-11-22 DIAGNOSIS — I69920 Aphasia following unspecified cerebrovascular disease: Secondary | ICD-10-CM

## 2012-11-22 DIAGNOSIS — I200 Unstable angina: Secondary | ICD-10-CM

## 2012-11-22 DIAGNOSIS — Z955 Presence of coronary angioplasty implant and graft: Secondary | ICD-10-CM

## 2012-11-22 DIAGNOSIS — R079 Chest pain, unspecified: Secondary | ICD-10-CM

## 2012-11-22 DIAGNOSIS — I2510 Atherosclerotic heart disease of native coronary artery without angina pectoris: Secondary | ICD-10-CM

## 2012-11-22 DIAGNOSIS — I6529 Occlusion and stenosis of unspecified carotid artery: Secondary | ICD-10-CM | POA: Diagnosis present

## 2012-11-22 DIAGNOSIS — D649 Anemia, unspecified: Secondary | ICD-10-CM

## 2012-11-22 DIAGNOSIS — E785 Hyperlipidemia, unspecified: Secondary | ICD-10-CM

## 2012-11-22 DIAGNOSIS — Z981 Arthrodesis status: Secondary | ICD-10-CM

## 2012-11-22 DIAGNOSIS — I639 Cerebral infarction, unspecified: Secondary | ICD-10-CM

## 2012-11-22 DIAGNOSIS — I779 Disorder of arteries and arterioles, unspecified: Secondary | ICD-10-CM

## 2012-11-22 DIAGNOSIS — E669 Obesity, unspecified: Secondary | ICD-10-CM | POA: Diagnosis present

## 2012-11-22 DIAGNOSIS — E119 Type 2 diabetes mellitus without complications: Secondary | ICD-10-CM

## 2012-11-22 DIAGNOSIS — I4891 Unspecified atrial fibrillation: Secondary | ICD-10-CM | POA: Diagnosis present

## 2012-11-22 DIAGNOSIS — I10 Essential (primary) hypertension: Secondary | ICD-10-CM

## 2012-11-22 DIAGNOSIS — Z8673 Personal history of transient ischemic attack (TIA), and cerebral infarction without residual deficits: Secondary | ICD-10-CM

## 2012-11-22 HISTORY — DX: Hyperlipidemia, unspecified: E78.5

## 2012-11-22 HISTORY — DX: Cerebral infarction, unspecified: I63.9

## 2012-11-22 HISTORY — DX: Peripheral vascular disease, unspecified: I73.9

## 2012-11-22 HISTORY — DX: Disorder of arteries and arterioles, unspecified: I77.9

## 2012-11-22 HISTORY — DX: Paroxysmal atrial fibrillation: I48.0

## 2012-11-22 MED ORDER — ASPIRIN 81 MG PO CHEW: 324.0000 mg | CHEWABLE_TABLET | ORAL | Status: DC

## 2012-11-22 MED ORDER — SODIUM CHLORIDE 0.9 % IJ SOLN
3.0000 mL | Freq: Two times a day (BID) | INTRAMUSCULAR | Status: DC
  Administered 2012-11-24: 3 mL via INTRAVENOUS

## 2012-11-22 MED ORDER — ATORVASTATIN CALCIUM 20 MG PO TABS
20.0000 mg | ORAL_TABLET | Freq: Every day | ORAL | Status: DC
  Administered 2012-11-23 – 2012-11-25 (×3): 20 mg via ORAL
  Filled 2012-11-22 (×4): qty 1

## 2012-11-22 MED ORDER — NITROGLYCERIN 2 % TD OINT
0.5000 [in_us] | TOPICAL_OINTMENT | Freq: Four times a day (QID) | TRANSDERMAL | Status: DC
  Filled 2012-11-22: qty 30

## 2012-11-22 MED ORDER — ONDANSETRON HCL 4 MG/2ML IJ SOLN: 4.0000 mg | Freq: Four times a day (QID) | INTRAMUSCULAR | Status: DC | PRN

## 2012-11-22 MED ORDER — ENOXAPARIN SODIUM 100 MG/ML ~~LOC~~ SOLN
85.0000 mg | Freq: Two times a day (BID) | SUBCUTANEOUS | Status: DC
  Administered 2012-11-22: 85 mg via SUBCUTANEOUS
  Administered 2012-11-23: 11:00:00 via SUBCUTANEOUS
  Administered 2012-11-23: 85 mg via SUBCUTANEOUS
  Administered 2012-11-24: 11:00:00 via SUBCUTANEOUS
  Administered 2012-11-24: 85 mg via SUBCUTANEOUS
  Filled 2012-11-22 (×8): qty 1

## 2012-11-22 MED ORDER — NITROGLYCERIN 0.4 MG SL SUBL: 0.4000 mg | SUBLINGUAL_TABLET | SUBLINGUAL | Status: DC | PRN

## 2012-11-22 MED ORDER — SODIUM CHLORIDE 0.9 % IV SOLN: 250.0000 mL | INTRAVENOUS | Status: DC | PRN

## 2012-11-22 MED ORDER — BENAZEPRIL HCL 5 MG PO TABS
5.0000 mg | ORAL_TABLET | Freq: Every day | ORAL | Status: DC
  Administered 2012-11-23 – 2012-11-26 (×4): 5 mg via ORAL
  Filled 2012-11-22 (×4): qty 1

## 2012-11-22 MED ORDER — INSULIN ASPART 100 UNIT/ML ~~LOC~~ SOLN: 0.0000 [IU] | Freq: Every day | SUBCUTANEOUS | Status: DC

## 2012-11-22 MED ORDER — CLOPIDOGREL BISULFATE 75 MG PO TABS
75.0000 mg | ORAL_TABLET | Freq: Every day | ORAL | Status: DC
  Administered 2012-11-23 – 2012-11-26 (×4): 75 mg via ORAL
  Filled 2012-11-22 (×4): qty 1

## 2012-11-22 MED ORDER — METOPROLOL TARTRATE 25 MG PO TABS
25.0000 mg | ORAL_TABLET | Freq: Two times a day (BID) | ORAL | Status: DC
  Filled 2012-11-22: qty 1

## 2012-11-22 MED ORDER — LINAGLIPTIN 5 MG PO TABS
5.0000 mg | ORAL_TABLET | Freq: Every day | ORAL | Status: DC
  Administered 2012-11-23 – 2012-11-26 (×4): 5 mg via ORAL
  Filled 2012-11-22 (×5): qty 1

## 2012-11-22 MED ORDER — SODIUM CHLORIDE 0.9 % IJ SOLN
3.0000 mL | Freq: Two times a day (BID) | INTRAMUSCULAR | Status: DC
Start: 2012-11-22 — End: 2012-11-25
  Administered 2012-11-23 – 2012-11-24 (×3): 3 mL via INTRAVENOUS

## 2012-11-22 MED ORDER — ASPIRIN EC 81 MG PO TBEC
81.0000 mg | DELAYED_RELEASE_TABLET | Freq: Every day | ORAL | Status: DC
  Administered 2012-11-24 – 2012-11-26 (×2): 81 mg via ORAL
  Filled 2012-11-22 (×3): qty 1

## 2012-11-22 MED ORDER — ACETAMINOPHEN 325 MG PO TABS: 650.0000 mg | ORAL_TABLET | ORAL | Status: DC | PRN

## 2012-11-22 MED ORDER — METOPROLOL TARTRATE 12.5 MG HALF TABLET
12.5000 mg | ORAL_TABLET | Freq: Two times a day (BID) | ORAL | Status: DC
  Administered 2012-11-22 – 2012-11-26 (×8): 12.5 mg via ORAL
  Filled 2012-11-22 (×9): qty 1

## 2012-11-22 MED ORDER — SODIUM CHLORIDE 0.9 % IV SOLN
INTRAVENOUS | Status: DC
  Administered 2012-11-25: 04:00:00 via INTRAVENOUS

## 2012-11-22 MED ORDER — SODIUM CHLORIDE 0.9 % IJ SOLN: 3.0000 mL | INTRAMUSCULAR | Status: DC | PRN

## 2012-11-22 MED ORDER — INSULIN ASPART 100 UNIT/ML ~~LOC~~ SOLN
0.0000 [IU] | Freq: Three times a day (TID) | SUBCUTANEOUS | Status: DC
  Administered 2012-11-23 – 2012-11-24 (×5): 2 [IU] via SUBCUTANEOUS
  Administered 2012-11-25: 18:00:00 3 [IU] via SUBCUTANEOUS
  Administered 2012-11-26: 09:00:00 2 [IU] via SUBCUTANEOUS

## 2012-11-22 NOTE — H&P (Signed)
NAME:  MARINEL, PAVEL ANN MEADOWS ROOM: [place] NUMBER:  [id number] LOCATION: 50F [id number] 01 ADM/VISIT DATE:  11/21/2012   ADM Oris DroneCaryl Bis:  [id number] DOB: 1945-10-18   PRIMARY CARDIOLOGIST:  Rozann Lesches, M.D. (new).  PRIMARY CARE PHYSICIAN:  Michell Heinrich, M.D., [place], [place].  REFERRING PHYSICIAN:  Kerri Perches, M.D., [facility] hospitalist  REASON FOR CONSULTATION:  Chest pain  Ms. Mcchesney is a 67 year old female, with no documented history of CAD, but multiple cardiac risk factors, including DM, and with history of normal adenosine Cardiolite here at [facility], in July 2008.  Patient also has been diagnosed with prior strokes, the last approximately 3 years ago, treated at [facility], at which time she was started on Plavix.  Patient presents with no history of exertional angina pectoris.  However, she does complain of feeling "weak" with her usual daily activities.  She is somewhat limited in her mobility, resulting from her most recent stroke.  She also apparently suffered some expressive aphasia at that time, as well.  Patient awoke yesterday morning at approximately 6 a.m. with new, severe (8/10) sharp chest pain underlying left breast, and radiating to the left shoulder and into the biceps, as well as into the back.  There was associated dyspnea, but no diaphoresis or nausea.  She had never experienced this before, and it was very alarming to her.  She took 1 Zantac tablet, with no relief, and states that this is not like her reflux symptoms in the past.  Patient presented to the emergency room with a blood pressure 144/78, pulse 79, and was afebrile.  She received 4 baby aspirin, and has ruled out for MI with normal troponins.  However, she has had recurrent brief episodes of chest pain, very mild and characterized as dull, approximately 30 minutes apart, since her initial presentation.  She currently denies any chest pain.  Admission EKG indicated NSR with  a question of possible prior anterior infarct with Q-waves in the anteroseptal leads, and poor R-wave progression.  ALLERGIES:  No known drug allergies.  HOME MEDICATIONS: 1. Amlodipine/benazepril 5 mg daily. 2. Cholestyramine 4 g as needed. 3. Coenzyme Q10, 200 mg daily. 4. Hydrocodone 5/325 mg q.4 hours p.r.n. 5. Janumet XR 50/1000 mg b.i.d. 6. Plavix 75 mg daily. 7. Simvastatin 10 mg daily.  PAST MEDICAL HISTORY: 1. HTN. 2. DM. 3. HLD. 4. Status post strokes. 5. Obesity. 6. Nonobstructive carotid artery disease. A. Less than 50% bilateral ICA stenosis, 07/2007.  SURGICAL HISTORY:  Cholecystectomy, bilateral carpal tunnel surgery, cervical neck fusion, hernia repair, and bilateral rotator cuff surgery.  SOCIAL HISTORY:  Patient lives alone, in [place].  She has 1 son.  She has never smoked tobacco, and denies alcohol use.  Ambulates freely, without use of walker or cane.  FAMILY HISTORY:  Both parents deceased, complications from stroke.  No known coronary disease.  REVIEW OF SYSTEMS:  As per HPI.  The remaining systems reviewed and are negative.  PHYSICAL EXAMINATION:  Vital signs:  Blood pressure currently 132/70, pulse 60s, regular, respirations 20, temperature afebrile, sats 97% on 2L, weight 195 pounds.  General:  An 67 year old female, obese, lying supine, in no distress.  HEENT:  Normocephalic, atraumatic.  PERRLA, EOMI.  Neck:  Palpable bilateral carotid pulses without bruits; no JVD at 30 degrees.  Lungs:  Clear to auscultation all fields.  Heart:  Regular rhythm.  No significant murmurs.  No rubs or gallops.  Abdomen:  Soft, protuberant, intact bowel sounds.  Extremities:  Palpable bilateral femoral pulses, without bruits; palpable dorsalis pedis pulses; no peripheral edema.  Skin:  Warm and dry.  Musculoskeletal:  No obvious deformity.  Neurologic:  No focal deficit.  RADIOLOGIC STUDIES:  Admission chest x-ray:  No acute changes.  CT angiogram of the chest:  Negative  for pulmonary embolus; coronary artery calcifications.  LABORATORY DATA:  Troponins less than 0.01 (3).  INR 1.0, D-dimer 33.  BNP 64.  LDL 64.  Sodium 141, potassium 3.9, BUN 17, creatinine 1.0, glucose 126.  WBC 7800, hemoglobin 11.9, hematocrit 37, and platelets of 417,000.  IMPRESSION: 1. Unstable angina pectoris. A. Normal troponins. B. Abnormal electrocardiogram. C. Normal adenosine Cardiolite; ejection fraction greater than 70%, 03/2007. 2. Multiple cardiac risk factors. A. Hypertension. B. Diabetes mellitus. C. Hyperlipidemia. D. Age. E. Obesity.  PLAN:  Patient presents with new onset, severe chest pain at rest, quite worrisome for unstable angina pectoris, particularly given her multiple cardiac risk factors, including diabetes mellitus.  Moreover, there is evidence of coronary artery calcification by CT angiogram of the chest.  Although troponins are all within normal limits, resting electrocardiogram is also abnormal, suggestive of possible prior anteroseptal MI.  Given all this, our initial recommendation was to pursue an aggressive evaluation with diagnostic coronary angiography at [facility].  This was presented to the patient.  However, she clearly indicated her preference to proceed with a noninvasive evaluation first.  Therefore, we will arrange for an in-house ischemic evaluation today with a Lexiscan stress Cardiolite.  We did indicate to her, however, that we would keep a low threshold for a cardiac catheterization, if there is any suggestion of ischemia.  The patient was agreeable with this plan.  Patient seen and examined in conjunction with Dr. Domenic Polite.  An extensive review was done of patient's prior hospitalization records.                                                         __________________________    Mannie Stabile, P.A.   Attending note:  Patient seen and examined. Reviewed available records and hospital course, discussed case with Mr. Jacqualine Mau. Ms.  Stinar presents with recent onset chest discomfort at rest associated with weakness, concerning for unstable angina by description, although associated with normal cardiac markers and a nonspecific abnormal ECG. Cardiac risk factor profile includes diabetes mellitus, hypertension, hyperlipidemia, also she has coronary calcifications by CT of the chest done to exclude pulmonary embolus. She preferred a noninvasive approach initially, and Lexiscan Cardiolite performed today showed LVEF of 85%, no clearly diagnostic ST segment abnormalities, however suggestion of mid to basal inferolateral ischemia in the setting of soft tissue attenuation and gut uptake. She has continued to have some stuttering chest pain symptoms under observation, and in light of all of these factors, recommendation is to pursue cardiac catheterization for best definition of her coronary anatomy and to evaluate for any revascularization options. Transfer to [facility] is being arranged for later today. Patient is in agreement.  Satira Sark, M.D., F.A.C.C.

## 2012-11-22 NOTE — Progress Notes (Signed)
ANTICOAGULATION CONSULT NOTE - Initial Consult  Pharmacy Consult for Enoxaparin Indication: chest pain/ACS  Allergies not on file  Patient Measurements: Height: 5\' 1"  (154.9 cm) Weight: 193 lb 3.2 oz (87.635 kg) IBW/kg (Calculated) : 47.8 Heparin Dosing Weight: 87.6 kg  Vital Signs: Temp: 98.5 F (36.9 C) (03/14 1952) Temp src: Oral (03/14 1952) BP: 89/42 mmHg (03/14 1952) Pulse Rate: 96 (03/14 1952)  Labs: No results found for this basename: HGB, HCT, PLT, APTT, LABPROT, INR, HEPARINUNFRC, CREATININE, CKTOTAL, CKMB, TROPONINI,  in the last 72 hours  Estimated Creatinine Clearance: 49 ml/min (by C-G formula based on Cr of 1.09).   Medical History: No past medical history on file.  Medications:  Scheduled:  . [START ON 11/23/2012] aspirin  324 mg Oral Pre-Cath  . [START ON 11/23/2012] aspirin EC  81 mg Oral Daily  . [START ON 11/23/2012] atorvastatin  20 mg Oral q1800  . [START ON 11/23/2012] benazepril  5 mg Oral Daily  . [START ON 11/23/2012] clopidogrel  75 mg Oral Q breakfast  . [START ON 11/23/2012] insulin aspart  0-15 Units Subcutaneous TID WC  . insulin aspart  0-5 Units Subcutaneous QHS  . linagliptin  5 mg Oral Daily  . metoprolol tartrate  25 mg Oral BID  . nitroGLYCERIN  0.5 inch Topical Q6H  . sodium chloride  3 mL Intravenous Q12H  . sodium chloride  3 mL Intravenous Q12H    Assessment: 67 yo female transferred from [facility] with chest pain.  No known hx CAD, but multiple cardiac risk factors and abnormal ECHO.  Pharmacy asked to begin anticoagulation with enoxaparin.  She received 40 mg of Lovenox ~ 10 AM at [facility] today.  No anticoagulants PTA.  No history of bleeding per patient.  Estimated CrCl ~ 60 ml/min.  Today's labs from [facility]: WBC 7.8; Hgb 11.9; Hgb 36.5; Pltc 417 Scr 0.97  Goal of Therapy:  Anti-Xa level 0.6-1.2 units/ml 4hrs after LMWH dose given Monitor platelets by anticoagulation protocol: Yes   Plan:  1. Start Lovenox 85 mg sq BID.    2. CBC q 72 hrs while on Lovenox.  Uvaldo Rising, BCPS  Clinical Pharmacist Pager [phone]  11/22/2012 8:40 PM

## 2012-11-23 ENCOUNTER — Encounter (HOSPITAL_COMMUNITY): Payer: Self-pay | Admitting: Internal Medicine

## 2012-11-23 DIAGNOSIS — R9439 Abnormal result of other cardiovascular function study: Secondary | ICD-10-CM | POA: Diagnosis present

## 2012-11-23 DIAGNOSIS — I10 Essential (primary) hypertension: Secondary | ICD-10-CM | POA: Diagnosis present

## 2012-11-23 LAB — GLUCOSE, CAPILLARY
Glucose-Capillary: 123 mg/dL — ABNORMAL HIGH (ref 70–99)
Glucose-Capillary: 135 mg/dL — ABNORMAL HIGH (ref 70–99)
Glucose-Capillary: 107 mg/dL — ABNORMAL HIGH (ref 70–99)

## 2012-11-23 MED ORDER — ASPIRIN 81 MG PO CHEW
324.0000 mg | CHEWABLE_TABLET | Freq: Once | ORAL | Status: AC
  Administered 2012-11-25: 324 mg via ORAL
  Filled 2012-11-23: qty 4

## 2012-11-23 NOTE — Progress Notes (Signed)
.    Patient Name: Bianca White      SUBJECTIVE: 67 year old woman admitted last night for chest pain with multiple risk factors. She has a history of vertebral vascular disease. Her ECG at [facility] was quite abnormal suggestive of prior ASMI Her Myoview at [facility] was abnormal with basilar inferolateral ischemia. Given the fact that she is having ongoing chest discomfort it was elected to pursue catheterization  She's having no further pain. She is concerned about back pain.     Past Medical History  Diagnosis Date  . Diabetes mellitus   . Hypertension   . CVA (cerebral infarction)   . Obesity   . Abnormal cardiovascular stress test     PHYSICAL EXAM Filed Vitals:   11/22/12 1952 11/22/12 2330 11/23/12 0449 11/23/12 1113  BP: 89/42 107/56 95/57 114/73  Pulse: 96  78 82  Temp: 98.5 F (36.9 C)  97.5 F (36.4 C)   TempSrc: Oral  Oral   Height: 5\' 1"  (1.549 m)     Weight: 193 lb 3.2 oz (87.635 kg)     SpO2: 96%  98%     Well developed and nourished in no acute distress HENT normal Neck supple with JVP-flat Clear Regular rate and rhythm, no murmurs or gallops Abd-soft with active BS No Clubbing cyanosis edema Skin-warm and dry A & Oriented  Grossly normal sensory and motor function  TELEMETRY: Reviewed telemetry pt in NSR:    Intake/Output Summary (Last 24 hours) at 11/23/12 1305 Last data filed at 11/23/12 0900  Gross per 24 hour  Intake    240 ml  Output      0 ml  Net    240 ml    LABS: Basic Metabolic Panel:   ASSESSMENT AND PLAN:  Active Problems:   Chest pain   Diabetes mellitus   Abnormal cardiovascular stress test   CVA (cerebral infarction)   Hypertension  The patient has abnormal Myoview with a chest pain syndrome. She has multiple cardiac risk factors. We'll plan to undertake catheterization on Monday. Risks and benefits have been reviewed. She was referred to have a radial procedure she is back pain issues.  Signed, Virl Axe MD  11/23/2012

## 2012-11-23 NOTE — Progress Notes (Signed)
Pt afib/flutter on monitor, rate controlled. EKG confirmed rhythm. Pt has no hx of afib/flutter. VSS.  MD on-call made aware. Will continue to monitor.

## 2012-11-24 ENCOUNTER — Encounter (HOSPITAL_COMMUNITY): Payer: Self-pay | Admitting: Internal Medicine

## 2012-11-24 LAB — GLUCOSE, CAPILLARY
Glucose-Capillary: 147 mg/dL — ABNORMAL HIGH (ref 70–99)
Glucose-Capillary: 131 mg/dL — ABNORMAL HIGH (ref 70–99)

## 2012-11-24 MED ORDER — OFF THE BEAT BOOK
Freq: Once | Status: AC
  Administered 2012-11-24: 13:00:00
  Filled 2012-11-24: qty 1

## 2012-11-24 MED ORDER — DIAZEPAM 5 MG PO TABS
5.0000 mg | ORAL_TABLET | ORAL | Status: AC
  Administered 2012-11-25: 5 mg via ORAL
  Filled 2012-11-24: qty 1

## 2012-11-24 NOTE — Progress Notes (Signed)
CMT notified nurse that pt spontaneously converted to NSR at 0409. EKG to confirm. VSS. Will continue to monitor.

## 2012-11-24 NOTE — Progress Notes (Signed)
.    Patient Name: Bianca White      SUBJECTIVE: 67 year old woman admitted last night for chest pain with multiple risk factors. She has a history of vertebral vascular disease. Her ECG at [facility] was quite abnormal suggestive of prior ASMI Her Myoview at [facility] was abnormal with basilar inferolateral ischemia. Given the fact that she is having ongoing chest discomfort it was elected to pursue catheterization  She's having no further pain. She is concerned about back pain.  The nurse identified AFib on tele     Past Medical History  Diagnosis Date  . Diabetes mellitus   . Hypertension   . CVA (cerebral infarction)   . Obesity   . Abnormal cardiovascular stress test     PHYSICAL EXAM Filed Vitals:   11/23/12 1400 11/23/12 1530 11/23/12 2100 11/24/12 0427  BP: 107/64 115/56 138/68 102/54  Pulse: 67 68 77 64  Temp: 98.2 F (36.8 C) 99.2 F (37.3 C) 98.6 F (37 C) 98 F (36.7 C)  TempSrc: Oral Oral Oral Oral  Resp:   18 16  Height:      Weight:      SpO2: 97% 99% 100% 98%    Well developed and nourished in no acute distress HENT normal Neck supple with JVP-flat Clear Regular rate and rhythm, no murmurs or gallops Abd-soft with active BS No Clubbing cyanosis edema Skin-warm and dry A & Oriented  Grossly normal sensory and motor function  TELEMETRY: Reviewed telemetry pt in NSR with episodes of Afib    Intake/Output Summary (Last 24 hours) at 11/24/12 1107 Last data filed at 11/24/12 0900  Gross per 24 hour  Intake    480 ml  Output      0 ml  Net    480 ml    LABS: Basic Metabolic Panel:   ASSESSMENT AND PLAN:  Active Problems:   Chest pain   Diabetes mellitus   Abnormal cardiovascular stress test   CVA (cerebral infarction)   Hypertension  The patient has abnormal Myoview with a chest pain syndrome. She has multiple cardiac risk factors. We'll plan to undertake catheterization on Monday. Risks and benefits have been reviewed. She  was referred to have a radial procedure she has back pain issues.  BP soft so will d/c nitroglycerine  AFib detected on telemetry and so with hx of CVA will need longterm anticoagulation at discharge  Signed, Virl Axe MD  11/24/2012

## 2012-11-24 NOTE — Progress Notes (Signed)
Utilization Review Completed.Donne Anon T3/16/2014

## 2012-11-25 ENCOUNTER — Ambulatory Visit (HOSPITAL_COMMUNITY): Admission: RE | Admit: 2012-11-25 | Payer: Medicare Other | Source: Ambulatory Visit | Admitting: Cardiology

## 2012-11-25 ENCOUNTER — Other Ambulatory Visit: Payer: Self-pay

## 2012-11-25 ENCOUNTER — Encounter (HOSPITAL_COMMUNITY)
Admission: AD | Disposition: A | Discharge status: Home / Self Care | Payer: Self-pay | Source: Other Acute Inpatient Hospital | Attending: Internal Medicine

## 2012-11-25 DIAGNOSIS — I2119 ST elevation (STEMI) myocardial infarction involving other coronary artery of inferior wall: Secondary | ICD-10-CM

## 2012-11-25 DIAGNOSIS — I2510 Atherosclerotic heart disease of native coronary artery without angina pectoris: Secondary | ICD-10-CM

## 2012-11-25 HISTORY — PX: LEFT HEART CATHETERIZATION WITH CORONARY ANGIOGRAM: SHX5451

## 2012-11-25 HISTORY — PX: PERCUTANEOUS CORONARY STENT INTERVENTION (PCI-S): SHX5485

## 2012-11-25 LAB — CBC
HCT: 33.9 % — ABNORMAL LOW (ref 36.0–46.0)
HCT: 31.1 % — ABNORMAL LOW (ref 36.0–46.0)
Hemoglobin: 10.6 g/dL — ABNORMAL LOW (ref 12.0–15.0)
MCHC: 33 g/dL (ref 30.0–36.0)
MCHC: 34.1 g/dL (ref 30.0–36.0)
RDW: 13.5 % (ref 11.5–15.5)
WBC: 7.2 10*3/uL (ref 4.0–10.5)

## 2012-11-25 LAB — POCT ACTIVATED CLOTTING TIME: Activated Clotting Time: 443 seconds

## 2012-11-25 LAB — BASIC METABOLIC PANEL
BUN: 25 mg/dL — ABNORMAL HIGH (ref 6–23)
Creatinine, Ser: 0.97 mg/dL (ref 0.50–1.10)
GFR calc Af Amer: 68 mL/min — ABNORMAL LOW (ref >90)
GFR calc non Af Amer: 58 mL/min — ABNORMAL LOW (ref >90)
Potassium: 4 mEq/L (ref 3.5–5.1)

## 2012-11-25 LAB — D-DIMER, QUANTITATIVE: D-Dimer, Quant: 1.95 ug/mL-FEU — ABNORMAL HIGH (ref 0.00–0.48)

## 2012-11-25 SURGERY — LEFT HEART CATHETERIZATION WITH CORONARY ANGIOGRAM
Anesthesia: LOCAL

## 2012-11-25 MED ORDER — ACETAMINOPHEN 325 MG PO TABS
650.0000 mg | ORAL_TABLET | ORAL | Status: DC | PRN
  Administered 2012-11-25 – 2012-11-26 (×2): 650 mg via ORAL
  Filled 2012-11-25 (×2): qty 2

## 2012-11-25 MED ORDER — SODIUM CHLORIDE 0.9 % IV SOLN
INTRAVENOUS | Status: AC
  Administered 2012-11-25: 11:00:00 via INTRAVENOUS

## 2012-11-25 MED ORDER — MIDAZOLAM HCL 2 MG/2ML IJ SOLN
INTRAMUSCULAR | Status: AC
  Filled 2012-11-25: qty 2

## 2012-11-25 MED ORDER — CLOPIDOGREL BISULFATE 75 MG PO TABS
ORAL_TABLET | ORAL | Status: AC
  Filled 2012-11-25: qty 3

## 2012-11-25 MED ORDER — FENTANYL CITRATE 0.05 MG/ML IJ SOLN
INTRAMUSCULAR | Status: AC
  Filled 2012-11-25: qty 2

## 2012-11-25 MED ORDER — HEPARIN (PORCINE) IN NACL 2-0.9 UNIT/ML-% IJ SOLN
INTRAMUSCULAR | Status: AC
  Filled 2012-11-25: qty 1000

## 2012-11-25 MED ORDER — BIVALIRUDIN 250 MG IV SOLR
INTRAVENOUS | Status: AC
  Filled 2012-11-25: qty 250

## 2012-11-25 MED ORDER — LIDOCAINE HCL (PF) 1 % IJ SOLN
INTRAMUSCULAR | Status: AC
  Filled 2012-11-25: qty 30

## 2012-11-25 NOTE — H&P (View-Only) (Signed)
Patient seen and examined.  Case discussed.  She has symptoms with both typical and atypical features.  She has atrial fib detected per note of SK.  This will impact the possible approach, especially in light of her DM.  She currently is on DAPT at present.  I have explained the risks to the patient and her husband.  She is willing to proceed.

## 2012-11-25 NOTE — Interval H&P Note (Signed)
History and Physical Interval Note:  11/25/2012 7:53 AM  Bianca White  has presented today for surgery, with the diagnosis of Chest pain  The various methods of treatment have been discussed with the patient and family. After consideration of risks, benefits and other options for treatment, the patient has consented to  Procedure(s): LEFT HEART CATHETERIZATION WITH CORONARY ANGIOGRAM (N/A) as a surgical intervention .  The patient's history has been reviewed, patient examined, no change in status, stable for surgery.  I have reviewed the patient's chart and labs.  Questions were answered to the patient's satisfaction.     Bing Quarry

## 2012-11-25 NOTE — CV Procedure (Signed)
Cardiac Catheterization Procedure Note  Name: Bianca White MRN: [id number] DOB: 09-Jun-1946  Procedure: Left Heart Cath, Selective Coronary Angiography, LV angiography,  PTCA/Stent of RCA, IVUS post intervention RCA  Indication: The patient presented with recurrent chest pain at rest.  Enzymes are negative.  Precath Nuclear imaging showed inferior and inferobasal ischemia.  She was sent for cath.     Diagnostic Procedure Details: The right groin was prepped, draped, and anesthetized with 1% lidocaine. Using the modified Seldinger technique, a 4 French sheath was introduced into the right femoral artery. Standard Judkins catheters were used for selective coronary angiography and left ventriculography. Catheter exchanges were performed over a wire.  The diagnostic procedure was well-tolerated without immediate complications.  PROCEDURAL FINDINGS Hemodynamics: AO 144/74 (104) LV 136/11 No gradient  Coronary angiography: Coronary dominance: right  Left mainstem: Short and without significant obstruction.   Left anterior descending (LAD): The LAD is calcified. The vessel is patent proximally.  After the septal diagonal takeoff the vessel tapers to a 1.5-2.0 mm diffusely diseased vessel.  There is 50 then 75% stenosis in the small caliber portion.  The distal vessel continues to taper.  The first diagonal is small and has 75% mid narrowing.  This is a 1.0 mm artery.    Left circumflex (LCx): Provides a tiny intermediate then becomes a large bifurcating marginal.  The mid vessel has a 40% eccentric plaque.  This vessel is large in caliber.   Right coronary artery (RCA): The RCA shows mild diffuse segmental irregularity in the proximal mid vessel, then a 70% complex eccentric ulcerative lesion. Distally there is moderate sized PDA and PLA system.    Left ventriculography: Left ventricular systolic function is normal, LVEF is estimated at 50-55%, there is no significant mitral regurgitation.   There was mild inferobasal hypokinesis noted.    PCI Procedure Note:  Following the diagnostic procedure, I reviewed the films with Dr. Burt Knack.  The patient has an abnormal nuclear study, rest pain and an ulcerative lesion.  The decision was made to proceed with PCI. The sheath was upsized to a 6 [place]. Weight-based bivalirudin was given for anticoagulation. Once a therapeutic ACT was achieved, a 6 [place] JR4 [facility] guide catheter was inserted.  A prowater coronary guidewire was used to cross the lesion.  The lesion was predilated with a 2.86mm balloon.   There was lateral dissection in the lesion area noted post predilatation.   The lesion was then stented with a 2.5 by 20 mm stent, with careful attention to cover the entire area.  The stent was postdilated with a 2.33mm noncompliant balloon.  Following PCI, there was 0% residual stenosis and TIMI-3 flow.  There was a persistent lateral contrast line exterior to the stent in the mid vessel, and IVUS was performed to evaluate the edges.  No edge tear was noted----slight underexpansion at the lesion site, so a high pressure redilatation was done at that site.  Final angios were performed.  Femoral hemostasis was achieved with 33F Angioseal.  .  The patient tolerated the PCI procedure well. There were no immediate procedural complications.  The patient was transferred to the post catheterization recovery area for further monitoring.  PCI Data: Vessel - RCA/Segment - 2 Percent Stenosis (pre)  70% with ulceration TIMI-flow 3 Stent Promus stent 2.5 by 20 Premier--post dil with 2.75 to high pressure.  Percent Stenosis (post) 0 TIMI-flow (post) 3 External dissection line exterior to the stent  IVUS;  Ref lum varies per site (  3 by 3 ditally ) .  Stent well deployed.  Distal edge ends in moderate plaque without dissection.  Proximal also ends in modest circumferential plaque with dissection.    Final Conclusions:   1.  Successful PCI of ulcerative RCA lesion 2.   Recent atrial fib asymptomatic noted 3.  Long term DAPT due to 2010 stroke  Recommendations:  1.  DAPT 2.  Measure P2Y12 3.  Will need antithrombin at the discretion of her primary cardiologist.    Bing Quarry 11/25/2012, 10:35 AM

## 2012-11-25 NOTE — Progress Notes (Signed)
Patient seen and examined.  Case discussed.  She has symptoms with both typical and atypical features.  She has atrial fib detected per note of SK.  This will impact the possible approach, especially in light of her DM.  She currently is on DAPT at present.  I have explained the risks to the patient and her husband.  She is willing to proceed.

## 2012-11-26 ENCOUNTER — Other Ambulatory Visit: Payer: Self-pay

## 2012-11-26 ENCOUNTER — Encounter (HOSPITAL_COMMUNITY): Payer: Self-pay | Admitting: *Deleted

## 2012-11-26 DIAGNOSIS — I2510 Atherosclerotic heart disease of native coronary artery without angina pectoris: Secondary | ICD-10-CM

## 2012-11-26 DIAGNOSIS — E785 Hyperlipidemia, unspecified: Secondary | ICD-10-CM

## 2012-11-26 DIAGNOSIS — E119 Type 2 diabetes mellitus without complications: Secondary | ICD-10-CM

## 2012-11-26 DIAGNOSIS — D649 Anemia, unspecified: Secondary | ICD-10-CM

## 2012-11-26 DIAGNOSIS — I480 Paroxysmal atrial fibrillation: Secondary | ICD-10-CM

## 2012-11-26 DIAGNOSIS — R079 Chest pain, unspecified: Secondary | ICD-10-CM

## 2012-11-26 DIAGNOSIS — Z8673 Personal history of transient ischemic attack (TIA), and cerebral infarction without residual deficits: Secondary | ICD-10-CM

## 2012-11-26 LAB — BASIC METABOLIC PANEL
CO2: 28 mEq/L (ref 19–32)
Calcium: 9 mg/dL (ref 8.4–10.5)
Glucose, Bld: 135 mg/dL — ABNORMAL HIGH (ref 70–99)
Sodium: 140 mEq/L (ref 135–145)

## 2012-11-26 LAB — CBC
HCT: 31.8 % — ABNORMAL LOW (ref 36.0–46.0)
Hemoglobin: 10.4 g/dL — ABNORMAL LOW (ref 12.0–15.0)
MCH: 28.1 pg (ref 26.0–34.0)
MCV: 85.9 fL (ref 78.0–100.0)
RBC: 3.7 MIL/uL — ABNORMAL LOW (ref 3.87–5.11)

## 2012-11-26 LAB — GLUCOSE, CAPILLARY: Glucose-Capillary: 148 mg/dL — ABNORMAL HIGH (ref 70–99)

## 2012-11-26 MED ORDER — METOPROLOL TARTRATE 12.5 MG HALF TABLET: 12.5000 mg | ORAL_TABLET | Freq: Two times a day (BID) | ORAL | Status: DC

## 2012-11-26 MED ORDER — CLOPIDOGREL BISULFATE 75 MG PO TABS: 75.0000 mg | ORAL_TABLET | Freq: Every day | ORAL | Status: DC

## 2012-11-26 MED ORDER — SITAGLIPTIN PHOS-METFORMIN HCL 50-1000 MG PO TABS: 1.0000 | ORAL_TABLET | Freq: Two times a day (BID) | ORAL | Status: DC

## 2012-11-26 MED ORDER — NITROGLYCERIN 0.4 MG SL SUBL: 0.4000 mg | SUBLINGUAL_TABLET | SUBLINGUAL | Status: DC | PRN

## 2012-11-26 MED ORDER — ASPIRIN 81 MG PO TBEC: 81.0000 mg | DELAYED_RELEASE_TABLET | Freq: Every day | ORAL | Status: DC

## 2012-11-26 MED FILL — Dextrose Inj 5%: INTRAVENOUS | Qty: 100 | Status: AC

## 2012-11-26 MED FILL — Dextrose Inj 5%: INTRAVENOUS | Qty: 50 | Status: AC

## 2012-11-26 NOTE — Progress Notes (Signed)
CARDIAC REHAB PHASE I   PRE:  Rate/Rhythm: 80SR  BP:  Supine:   Sitting: 138/83  Standing:    SaO2:   MODE:  Ambulation: 500 ft   POST:  Rate/Rhythm: 86SR  BP:  Supine:   Sitting: 168/62  Standing:    SaO2:  [id number] Pt walked 500 ft with asst x 1 with steady gait. Tolerated well. No CP. Education completed with pt. Discussed diabetic and heart healthy diets. Discussed CRP 2 and permission given to refer to [facility] [place] CRP 2. To sitting on side of bed after walk.   Graylon Good, RN BSN  11/26/2012 8:53 AM

## 2012-11-26 NOTE — Progress Notes (Signed)
Patient Name: Bianca White Date of Encounter: 11/26/2012     Active Problems:   Chest pain   Diabetes mellitus   Abnormal cardiovascular stress test   CVA (cerebral infarction)   Hypertension    SUBJECTIVES  Doing well.  Moving about without trouble.  No current shortness of breath.  She had some atrial fib on admission ECG.  With prior stroke, she will eventually need early fu and subsequent conversion to a strategy of antiplatelet and antithrombin.  I reviewed this in detail today.  I also told her no tub baths for more than a week (angioseal)  CURRENT MEDS . aspirin EC  81 mg Oral Daily  . atorvastatin  20 mg Oral q1800  . benazepril  5 mg Oral Daily  . clopidogrel  75 mg Oral Q breakfast  . insulin aspart  0-15 Units Subcutaneous TID WC  . insulin aspart  0-5 Units Subcutaneous QHS  . linagliptin  5 mg Oral Daily  . metoprolol tartrate  12.5 mg Oral BID    OBJECTIVE  Filed Vitals:   11/25/12 1956 11/26/12 0012 11/26/12 0500 11/26/12 0800  BP: 144/65 114/54 133/85 138/83  Pulse:  67 66 70  Temp: 98.1 F (36.7 C) 98.2 F (36.8 C) 98.3 F (36.8 C) 98 F (36.7 C)  TempSrc: Oral Oral Oral Oral  Resp: 20 20 20 20   Height:      Weight:   199 lb 15.3 oz (90.7 kg)   SpO2: 99% 97% 97% 96%    Intake/Output Summary (Last 24 hours) at 11/26/12 0927 Last data filed at 11/26/12 0500  Gross per 24 hour  Intake   1760 ml  Output   1550 ml  Net    210 ml   Filed Weights   11/22/12 1952 11/26/12 0500  Weight: 193 lb 3.2 oz (87.635 kg) 199 lb 15.3 oz (90.7 kg)    PHYSICAL EXAM  General: Pleasant, NAD. Neuro: Alert and oriented X 3. Moves all extremities spontaneously. Psych: Normal affect. HEENT:  Normal  Neck: Supple without bruits or JVD. Lungs:  Resp regular and unlabored, CTA. Heart: RRR no s3, s4, or murmurs. Abdomen: Soft, non-tender, non-distended, BS + x 4.  Extremities: No clubbing, cyanosis or edema. DP/PT/Radials 2+ and equal bilaterally.   Angioseal in place.  Some firmness over inguinal ligament, but no definite findings.    Accessory Clinical Findings  CBC  Recent Labs  11/25/12 2019 11/26/12 0638  WBC 7.2 6.8  HGB 10.6* 10.4*  HCT 31.1* 31.8*  MCV 84.1 85.9  PLT 355 [id number]   Basic Metabolic Panel  Recent Labs  11/25/12 0541 11/26/12 0638  NA 143 140  K 4.0 4.0  CL 106 105  CO2 26 28  GLUCOSE 136* 135*  BUN 25* 23  CREATININE 0.97 1.03  CALCIUM 8.9 9.0   Liver Function Tests No results found for this basename: AST, ALT, ALKPHOS, BILITOT, PROT, ALBUMIN,  in the last 72 hours No results found for this basename: LIPASE, AMYLASE,  in the last 72 hours Cardiac Enzymes No results found for this basename: CKTOTAL, CKMB, CKMBINDEX, TROPONINI,  in the last 72 hours BNP No components found with this basename: POCBNP,  D-Dimer  Recent Labs  11/25/12 0845  DDIMER 1.95*   Hemoglobin A1C No results found for this basename: HGBA1C,  in the last 72 hours Fasting Lipid Panel No results found for this basename: CHOL, HDL, LDLCALC, TRIG, CHOLHDL, LDLDIRECT,  in the last 72 hours Thyroid  Function Tests No results found for this basename: TSH, T4TOTAL, FREET3, T3FREE, THYROIDAB,  in the last 72 hours  TELE  No new a fib documented.    ECG  NSR.  Right axis.  No acute changes.    Radiology/Studies  No results found.  ASSESSMENT AND PLAN  1.  SP PCI of the RCA for ulcerative lesion. 2.  History of prior CVA 3.  Atrial fib documented with this admission. 4.  Elevated d dimer with negative CT angio per SM. 5.  Plavix responsiveness by P2 Y12 Verify Now testing. 6.  Anemia -- normocytic.      Home today FU Dr. Domenic Polite within one week No driving.  She should  remain on plavix for now with eventual conversion  ? Timing FU CBC.    Ambulate this am with home perhaps later today.   More than thirty minutes of discharge.        Signed, Bing Quarry MD, [facility], [place]

## 2012-11-26 NOTE — Discharge Summary (Signed)
Discharge Summary   Patient ID: Bianca White,  MRN: [id number], DOB/AGE: 12-31-1945 67 y.o.  Admit date: 11/22/2012 Discharge date: 11/26/2012  Primary Physician: Default, Provider, MD Primary Cardiologist: Garnet Koyanagi MD  Discharge Diagnoses Principal Problem:   Chest pain with high risk of acute coronary syndrome  - cardiac cath + PCI 11/25/12: 50%, 75% LAD, 75% mid D1, 40% mid LCx plaque, 70% complex eccentric ulcerative lesion s/p DES x 1; LVEF 50-55%, mild inferobasal HK  - DAPT- ASA/Plavix x 12 months  - Plavix responsiveness by P2Y12 testing  Active Problems:   Paroxysmal atrial fibrillation  - Noted on telemetry shortly after transfer  - Timing of anticoagulation to be determined on follow-up   CAD (coronary artery disease), native coronary artery  - Discharged on ASA/Plavix/ACEi/BB/statin/NTG SL PRN   Abnormal cardiovascular stress test   Diabetes mellitus, type 2    Hypertension   Hyperlipidemia   History of CVA (cerebrovascular accident)   Carotid artery disease, nonobstructive   Normocytic anemia   Allergies No Known Allergies  Diagnostic Studies/Procedures  PORTABLE CHEST X-RAY - 11/21/12  No acute findings.   CT-ANGIOGRAM CHEST - 11/21/12  No evidence of pulmonary embolus. Coronary artery calcifications. No acute findings.   LEXISCAN MYOVIEW - 11/22/12  Normal pharmacologic stress test, with non-diagnostic ECG. There was a normal heart rate response, with a normal blood pressure response. The patient experienced no chest pain. The test was terminated due to completion of vasodilator infusion.   CARDIAC CATHETERIZATION + PERCUTANEOUS CORONARY INTERVENTION - 11/25/12 Hemodynamics:  AO 144/74 (104)  LV 136/11  No gradient  Coronary angiography:  Coronary dominance: right  Left mainstem: Short and without significant obstruction.  Left anterior descending (LAD): The LAD is calcified. The vessel is patent proximally. After the septal diagonal takeoff  the vessel tapers to a 1.5-2.0 mm diffusely diseased vessel. There is 50 then 75% stenosis in the small caliber portion. The distal vessel continues to taper. The first diagonal is small and has 75% mid narrowing. This is a 1.0 mm artery.  Left circumflex (LCx): Provides a tiny intermediate then becomes a large bifurcating marginal. The mid vessel has a 40% eccentric plaque. This vessel is large in caliber.  Right coronary artery (RCA): The RCA shows mild diffuse segmental irregularity in the proximal mid vessel, then a 70% complex eccentric ulcerative lesion. Distally there is moderate sized PDA and PLA system.  Left ventriculography: Left ventricular systolic function is normal, LVEF is estimated at 50-55%, there is no significant mitral regurgitation. There was mild inferobasal hypokinesis noted.  PCI Data:  Vessel - RCA/Segment - 2  Percent Stenosis (pre) 70% with ulceration  TIMI-flow 3  Stent Promus stent 2.5 by 20 Premier--post dil with 2.75 to high pressure.  Percent Stenosis (post) 0  TIMI-flow (post) 3  External dissection line exterior to the stent  IVUS; Ref lum varies per site ( 3 by 3 ditally ) . Stent well deployed. Distal edge ends in moderate plaque without dissection. Proximal also ends in modest circumferential plaque with dissection.  Final Conclusions:  1. Successful PCI of ulcerative RCA lesion  2. Recent atrial fib asymptomatic noted  3. Long term DAPT due to 2010 stroke  History of Present Illness  Bianca White is a 67 y.o. female with no documented history of CAD, multiple cardiac risk factors including type 2 diabetes mellitus, hypertension, hyperlipidemia, and with a history of normal adenosine Cardiolite stress test at [facility] in July 2008. She has  a history of prior strokes, the last occurring approximately 3 years ago, treated at [facility], at which time she started on Plavix.  She presented to [facility] on 11/21/12 complaining of  chest discomfort. She will the morning prior to admission around 6 AM with new, severe (8/10) sharp chest pain underlying left breast, radiating to her left shoulder, arm and back. She reports associated dyspnea, but no diaphoresis or nausea. She took a Zantac tablet with no relief, noticed the pain was quite different from her prior reflux symptoms in the past.  In the emergency room, EKG revealed normal sinus rhythm with a question of possible prior anterior infarct with Q waves in anteroseptal leads and poor R-wave progression. Troponin x3 returns less than 0.01. D-dimer returned elevated at 3.34. A chest x-ray indicated no acute findings as above. A CT angiogram of the chest indicated no evidence of pulmonary embolism, coronary artery calcifications and otherwise no acute findings. A lipid panel revealed LDL 64. BNP 64.   Hospital Course  Given her history, cardiac risk factors and coronary artery calcifications noted on CT-A, the recommendation was made to proceed with aggressive ischemic evaluation in the form of a diagnostic cardiac catheterization. The patient however preferred to undergo noninvasive ischemic testing first. This was pursued in the form of left skin Myoview which returned normal but was noted to have mid to basal inferolateral ischemia in the setting of soft tissue attenuation and gut uptake per interpretation by Dr. Domenic Polite. The plan was made to transfer to [facility] for diagnostic cardiac cath the following Monday.  She remained stable over the weekend. There was an episode of paroxysmal atrial fibrillation noted shortly after arrival. She quickly restored and maintain normal sinus rhythm. Currently cardiac catheterization and the potential to resume dual antiplatelet therapy, anticoagulation was initially deferred. She was started on low-dose beta blocker.  She was informed, consented and prepped for cardiac catheterization which is outlined in full above. Notably,  there is a 70% ulcerative RCA lesion qualified by IVUS. She tolerated the procedure well without complications. Recommendation was made to pursue dual antiplatelet therapy x12 months. The P2Y12 was ordered, and returned at 53 indicating platelet responsiveness. She was noted to have a normocytic anemia which is stable. She was evaluated by Dr. Lia Foyer this morning after ambulating well with cardiac rehabilitation. Recommendation was made for discharge today, and to followup within one week in [place]. CBC will be drawn at this time. He also deferred the decision to initiate anticoagulation to Dr. Domenic Polite on followup as to avoid triple therapy and increased risk of bleeding status post stent placement. She will be discharged on the medication regimen outlined below. This information, including post cath instructions and activity restrictions, has been clearly outlined in the discharge AVS.   Discharge Vitals:  Blood pressure 132/68, pulse 62, temperature 98 F (36.7 C), temperature source Oral, resp. rate 20, height 5\' 1"  (1.549 m), weight 90.7 kg (199 lb 15.3 oz), SpO2 98.00%.   Labs: Recent Labs     11/25/12  2019  11/26/12  0638  WBC  7.2  6.8  HGB  10.6*  10.4*  HCT  31.1*  31.8*  MCV  84.1  85.9  PLT  355  319   Recent Labs     11/25/12  0845  DDIMER  1.95*    Recent Labs Lab 11/25/12 0541 11/26/12 0638  NA 143 140  K 4.0 4.0  CL 106 105  CO2 26 28  BUN 25* 23  CREATININE 0.97 1.03  CALCIUM 8.9 9.0  GLUCOSE 136* 135*   Disposition:  Discharge Orders   Future Appointments Provider Department Dept Phone   11/29/2012 1:40 PM Aurora Mask, PA-C [facility] [place]) [phone]   Future Orders Complete By Expires     Amb Referral to Cardiac Rehabilitation  As directed     Comments:      Referring to [facility] CRP 2    Diet - low sodium heart healthy  As directed     Increase activity slowly  As directed           Follow-up Information   Follow up  with SERPE, EUGENE, PA-C On 11/29/2012. (At 1:40 PM for follow-up after this hospitalization. )    Contact information:   [address] [place] [address] [phone]       Follow up with [facility] On 11/29/2012. (Please have labwork done as ordered. )    Contact information:   [address] [place] [address] [phone]     Discharge Medications:    Medication List    TAKE these medications       amLODipine-benazepril 5-10 MG per capsule  Commonly known as:  LOTREL  Take 1 capsule by mouth daily.     aspirin 81 MG EC tablet  Take 1 tablet (81 mg total) by mouth daily.     cholecalciferol 1000 UNITS tablet  Commonly known as:  VITAMIN D  Take 1,000 Units by mouth daily.     clopidogrel 75 MG tablet  Commonly known as:  PLAVIX  Take 1 tablet (75 mg total) by mouth daily.     Co Q 10 100 MG Caps  Take 300 mg by mouth daily.     HYDROcodone-acetaminophen 5-325 MG per tablet  Commonly known as:  NORCO/VICODIN  Take 1 tablet by mouth every 4 (four) hours as needed for pain.     metoprolol tartrate 12.5 mg Tabs  Commonly known as:  LOPRESSOR  Take 0.5 tablets (12.5 mg total) by mouth 2 (two) times daily.     nitroGLYCERIN 0.4 MG SL tablet  Commonly known as:  NITROSTAT  Place 1 tablet (0.4 mg total) under the tongue every 5 (five) minutes x 3 doses as needed for chest pain.     simvastatin 10 MG tablet  Commonly known as:  ZOCOR  Take 10 mg by mouth at bedtime.     sitaGLIPtan-metformin 50-1000 MG per tablet  Commonly known as:  JANUMET  Take 1 tablet by mouth 2 (two) times daily with a meal.  Start taking on:  11/27/2012       Outstanding Labs/Studies: CBC on 11/29/12  Duration of Discharge Encounter: Greater than 30 minutes including physician time.  Signed, R. Valeria Batman, PA-C 11/26/2012, 12:47 PM

## 2012-11-28 ENCOUNTER — Telehealth: Payer: Self-pay | Admitting: Cardiology

## 2012-11-28 NOTE — Telephone Encounter (Signed)
TCM; Patient needs to be called for f/u

## 2012-11-29 ENCOUNTER — Encounter: Payer: Self-pay | Admitting: Physician Assistant

## 2012-11-29 ENCOUNTER — Ambulatory Visit (INDEPENDENT_AMBULATORY_CARE_PROVIDER_SITE_OTHER): Payer: Medicare Other | Admitting: Physician Assistant

## 2012-11-29 VITALS — BP 118/65 | HR 75 | Ht 61.0 in | Wt 194.0 lb

## 2012-11-29 DIAGNOSIS — I480 Paroxysmal atrial fibrillation: Secondary | ICD-10-CM

## 2012-11-29 DIAGNOSIS — E785 Hyperlipidemia, unspecified: Secondary | ICD-10-CM

## 2012-11-29 DIAGNOSIS — D649 Anemia, unspecified: Secondary | ICD-10-CM

## 2012-11-29 DIAGNOSIS — I10 Essential (primary) hypertension: Secondary | ICD-10-CM

## 2012-11-29 DIAGNOSIS — I4891 Unspecified atrial fibrillation: Secondary | ICD-10-CM

## 2012-11-29 MED ORDER — WARFARIN SODIUM 5 MG PO TABS: 5.0000 mg | ORAL_TABLET | Freq: Every evening | ORAL | Status: DC

## 2012-11-29 MED ORDER — ASPIRIN 81 MG PO TBEC: 81.0000 mg | DELAYED_RELEASE_TABLET | Freq: Every day | ORAL | Status: AC

## 2012-11-29 NOTE — Assessment & Plan Note (Signed)
Quiescent on current medication regimen. Following discussion with Dr. Lia Foyer, plan is to DC ASA after 30 days, with concomitant initiation of Coumadin anticoagulation. Patient will be followed here in our clinic. Patient is to continue Plavix for one full year, after which she can be placed back on low-dose ASA, to be continued with Coumadin therapy, indefinitely.

## 2012-11-29 NOTE — Assessment & Plan Note (Signed)
Well controlled with recent LDL 64. Continue current low-dose simvastatin, which patient was on prior to admission.

## 2012-11-29 NOTE — Patient Instructions (Addendum)
   Stop Aspirin after 1 month  Begin Coumadin 5mg  every evening - should have first finger prick within 3-5 days of beginning medication.  Will not stop the Aspirin until therapeutic on the coumadin (2.0 - 3.0).    Continue Plavix for a full year Continue all other current medications. Call office to schedule new coumadin appointment the day you begin medication.   Follow up in  3 months

## 2012-11-29 NOTE — Assessment & Plan Note (Addendum)
Status post newly documented PAF with spontaneous conversion to NSR. Maintaining NSR as confirmed by EKG today. Following discussion with Dr. Lia Foyer, plan is to initiate Coumadin anticoagulation therapy, given her high risk of recurrent stroke (CHADS score: 4), 30 days post intervention. ASA to be discontinued at that time. After patient has been treated with Plavix for one full year, low-dose ASA can be resumed. She is then to be maintained on a combination regimen of low-dose ASA/Coumadin, indefinitely.

## 2012-11-29 NOTE — Assessment & Plan Note (Signed)
Normal Hgb by recent followup CBC

## 2012-11-29 NOTE — Assessment & Plan Note (Signed)
Very well controlled 

## 2012-11-29 NOTE — Progress Notes (Signed)
Primary Cardiologist: Johnny Bridge, MD (new)   HPI: Post hospital followup from [facility], following initial evaluation at [facility].   Patient presented with symptoms worrisome for UAP, in the context of no known history of CAD. She had had a normal Cardiolite in 2008. She ruled out for MI with NL troponins. Our initial recommendation was to proceed with a diagnostic cardiac catheterization; however, she declined and opted for a stress test. This was abnormal with suggestion of inferior lateral ischemia; EF 85%.   - Cardiac catheterization, March 17: 70% complex, ulcerative proximal RCA lesion; residual 75% distal LAD; 75% mid DX; EF 50-55%, mild inferobasal HK  She was successfully treated with DES. Postop course notable for transient PAF, which reverted spontaneously to NSR. Decision regarding initiation of anticoagulation therapy was deferred to Dr. Domenic Polite. Of note, she was on Plavix PTA and recommendation was to continue long-term DAPT, given history of stroke.  Patient denies any exertional CP, and states that overall she feels much better. She does have occasional, very brief pains on the left side of her chest, not associated with exertion. She denies tachycardia palpitations. She denies complications of the R groin incision site.  12-lead EKG today, reviewed by me, indicates NSR 69 bpm  No Known Allergies  Current Outpatient Prescriptions  Medication Sig Dispense Refill  . allopurinol (ZYLOPRIM) 100 MG tablet Take 1 tablet by mouth daily as needed.      Marland Kitchen amLODipine-benazepril (LOTREL) 5-20 MG per capsule Take 1 capsule by mouth daily.      Marland Kitchen aspirin 81 MG EC tablet Take 1 tablet (81 mg total) by mouth daily. (DISCONTINUE AFTER 1 MONTH)      . cholecalciferol (VITAMIN D) 1000 UNITS tablet Take 1,000 Units by mouth daily.      . clopidogrel (PLAVIX) 75 MG tablet Take 1 tablet (75 mg total) by mouth daily.  30 tablet  3  . Coenzyme Q10 (COQ-10) 200 MG CAPS Take 1 capsule by mouth daily.      Marland Kitchen  HYDROcodone-acetaminophen (NORCO/VICODIN) 5-325 MG per tablet Take 1 tablet by mouth every 4 (four) hours as needed for pain.      . metoprolol tartrate (LOPRESSOR) 12.5 mg TABS Take 0.5 tablets (12.5 mg total) by mouth 2 (two) times daily.  30 tablet  3  . nitroGLYCERIN (NITROSTAT) 0.4 MG SL tablet Place 1 tablet (0.4 mg total) under the tongue every 5 (five) minutes x 3 doses as needed for chest pain.  25 tablet  3  . simvastatin (ZOCOR) 10 MG tablet Take 10 mg by mouth at bedtime.      . sitaGLIPtan-metformin (JANUMET) 50-1000 MG per tablet Take 1 tablet by mouth 2 (two) times daily with a meal.      . [START ON 12/30/2012] warfarin (COUMADIN) 5 MG tablet Take 1 tablet (5 mg total) by mouth every evening.  30 tablet  3   No current facility-administered medications for this visit.    Past Medical History  Diagnosis Date  . Diabetes mellitus   . Hypertension   . CVA (cerebral infarction)   . Obesity   . Abnormal cardiovascular stress test   . Paroxysmal atrial fibrillation     detected on tele [facility] 3/14   . Hyperlipidemia   . Carotid artery disease     Nonobstructive; < 50% bilateral ICA stenosis, 07/2007.  Marland Kitchen CAD (coronary artery disease) 11/2012    DES-mid RCA    Past Surgical History  Procedure Laterality Date  . Percutaneous coronary  stent intervention (pci-s) Right 11/25/12    50%, 75% LAD, 75% mid D1, 40% mid LCx plaque, 70% complex eccentric ulcerative lesion s/p DES x 1; LVEF 50-55%, mild inferobasal HK    History   Social History  . Marital Status: Married    Spouse Name: N/A    Number of Children: N/A  . Years of Education: N/A   Occupational History  . Not on file.   Social History Main Topics  . Smoking status: Never Smoker   . Smokeless tobacco: Never Used  . Alcohol Use: Not on file  . Drug Use: Not on file  . Sexually Active: Not on file   Other Topics Concern  . Not on file   Social History Narrative  . No narrative on file    No family history  on file.  ROS: no nausea, vomiting; no fever, chills; no melena, hematochezia; no claudication  PHYSICAL EXAM: BP 118/65  Pulse 75  Ht 5\' 1"  (1.549 m)  Wt 194 lb (87.998 kg)  BMI 36.67 kg/m2 GENERAL: 67 year old female, obese; NAD HEENT: NCAT, PERRLA, EOMI; sclera clear; no xanthelasma NECK: palpable bilateral carotid pulses, no bruits; no JVD; no TM LUNGS: CTA bilaterally CARDIAC: RRR (S1, S2); no significant murmurs; no rubs or gallops ABDOMEN: soft, protuberant EXTREMETIES: no significant peripheral edema SKIN: warm/dry; no obvious rash/lesions MUSCULOSKELETAL: no joint deformity NEURO: no focal deficit; NL affect   EKG: reviewed and available in Electronic Records   ASSESSMENT & PLAN:  Paroxysmal atrial fibrillation Status post newly documented PAF with spontaneous conversion to NSR. Maintaining NSR as confirmed by EKG today. Following discussion with Dr. Lia Foyer, plan is to initiate Coumadin anticoagulation therapy, given her high risk of recurrent stroke (CHADS score: 4), 30 days post intervention. ASA to be discontinued at that time. After patient has been treated with Plavix for one full year, low-dose ASA can be resumed. She is then to be maintained on a combination regimen of low-dose ASA/Coumadin, indefinitely.  Normocytic anemia Normal Hgb by recent followup CBC  Hyperlipidemia Well controlled with recent LDL 64. Continue current low-dose simvastatin, which patient was on prior to admission.  Hypertension  Very well-controlled  CAD (coronary artery disease), native coronary artery Quiescent on current medication regimen. Following discussion with Dr. Lia Foyer, plan is to DC ASA after 30 days, with concomitant initiation of Coumadin anticoagulation. Patient will be followed here in our clinic. Patient is to continue Plavix for one full year, after which she can be placed back on low-dose ASA, to be continued with Coumadin therapy, indefinitely.    Gene Rui Wordell,  PAC

## 2012-12-05 NOTE — Telephone Encounter (Signed)
Patient seen in office for f/u

## 2012-12-26 NOTE — Discharge Summary (Signed)
Patient seen and ready for discharge.  Discharge plans were carefully orchestrated for early fu in [facility] for continuing care given her recent presentation.  She will need DAPT, but with history of CVA and atrial fib consideration of antithrombin therapy is important under the supervision of our team in [place].  She will be seen early.  Long discussion with patient at discharge.

## 2012-12-31 ENCOUNTER — Telehealth: Payer: Self-pay | Admitting: *Deleted

## 2012-12-31 NOTE — Telephone Encounter (Signed)
Patient started microbid for UTI and has one more pill left. Patient was informed by urologist to start coumadin after antibiotic is complete due to interactions. Patient wants to know if its okay to do this. Per coumadin nurse patient can start the coumadin on tomorrow and also patient is scheduled for cystoscopy on 01/03/13 so coumadin appointment will be changed to next Tuesday. Patient aware.

## 2013-01-07 ENCOUNTER — Ambulatory Visit (INDEPENDENT_AMBULATORY_CARE_PROVIDER_SITE_OTHER): Payer: Medicare Other | Admitting: *Deleted

## 2013-01-07 DIAGNOSIS — I480 Paroxysmal atrial fibrillation: Secondary | ICD-10-CM

## 2013-01-07 DIAGNOSIS — Z8673 Personal history of transient ischemic attack (TIA), and cerebral infarction without residual deficits: Secondary | ICD-10-CM

## 2013-01-07 DIAGNOSIS — Z7901 Long term (current) use of anticoagulants: Secondary | ICD-10-CM

## 2013-01-07 DIAGNOSIS — I4891 Unspecified atrial fibrillation: Secondary | ICD-10-CM

## 2013-01-07 LAB — POCT INR: INR: 1.6

## 2013-01-09 ENCOUNTER — Ambulatory Visit (INDEPENDENT_AMBULATORY_CARE_PROVIDER_SITE_OTHER): Payer: Medicare Other | Admitting: *Deleted

## 2013-01-09 DIAGNOSIS — I480 Paroxysmal atrial fibrillation: Secondary | ICD-10-CM

## 2013-01-09 DIAGNOSIS — I4891 Unspecified atrial fibrillation: Secondary | ICD-10-CM

## 2013-01-09 DIAGNOSIS — Z7901 Long term (current) use of anticoagulants: Secondary | ICD-10-CM

## 2013-01-09 DIAGNOSIS — Z8673 Personal history of transient ischemic attack (TIA), and cerebral infarction without residual deficits: Secondary | ICD-10-CM

## 2013-01-10 ENCOUNTER — Ambulatory Visit (INDEPENDENT_AMBULATORY_CARE_PROVIDER_SITE_OTHER): Payer: Medicare Other | Admitting: Nurse Practitioner

## 2013-01-10 ENCOUNTER — Encounter: Payer: Self-pay | Admitting: Nurse Practitioner

## 2013-01-10 VITALS — BP 99/58 | HR 95 | Ht 61.0 in | Wt 198.0 lb

## 2013-01-10 DIAGNOSIS — IMO0002 Reserved for concepts with insufficient information to code with codable children: Secondary | ICD-10-CM

## 2013-01-10 DIAGNOSIS — I63239 Cerebral infarction due to unspecified occlusion or stenosis of unspecified carotid arteries: Secondary | ICD-10-CM

## 2013-01-10 NOTE — Progress Notes (Signed)
Agree with above plan. 

## 2013-01-10 NOTE — Progress Notes (Signed)
HPI:  Returns for followup after her last visit 08/13/2012. 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 She was given two thirds does IV TPA and underwent clot extraction by Dr. Estanislado Pandy with complete recanalization. She had recent cardiac stent in March and has not started her cardiac rehabilitation. She has had no new TIA or stroke symptoms. She denies double vision, loss of vision, focal weakness, numbness, speech or swallowing problems, confusions, blackouts. Last carotid Doppler 02/20/2012 with negative for stenosis.   ROS:  neg  Physical Exam General: well developed, well nourished, seated, in no evident distress Head: head normocephalic and atraumatic. Oropharynx benign Neck: supple with no carotid or supraclavicular bruits Cardiovascular: regular rate and rhythm, no murmurs  Neurologic Exam Mental Status: Awake and fully alert. Oriented to place and time. Speech is normal   Cranial Nerves:  Pupils equal, briskly reactive to light. Extraocular movements full without nystagmus. Visual fields full to confrontation. Hearing intact and symmetric to finger snap. Facial sensation intact. Face, tongue, palate move normally and symmetrically. Neck flexion and extension normal.  Motor: Normal bulk and tone. Normal strength in all tested extremity muscles. Mild diminished movements of the fingers of her right hand Sensory.: intact to touch and pinprick and vibratory.  Coordination: Rapid alternating movements normal in all extremities. Finger-to-nose and heel-to-shin performed accurately bilaterally. Gait and Station: Arises from chair without difficulty. Stance is normal. Gait demonstrates normal stride length and balance . Able to heel, toe and tandem walk without difficulty.  Reflexes: 1+ and symmetric. Toes downgoing.     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 The patient was given two thirds PT PA and underwent clot extraction with complete recanalization. She has recovered well     PLAN: Continue Plavix for secondary stroke prevention Patient is also on Coumadin for recent cardiac stent. Need to check on cardiac rehab.  Blood pressure is in excellent control Keep LDL below 70 Followup in 6 months   Dennie Bible, GNP-BC APRN

## 2013-01-10 NOTE — Patient Instructions (Addendum)
Continue Plavix for secondary stroke prevention Patient is also on Coumadin or recent cardiac stent.  Blood pressure is in excellent control Keep LDL below 70 Followup in 6 months

## 2013-01-10 NOTE — Progress Notes (Signed)
Agree with above 

## 2013-01-14 ENCOUNTER — Ambulatory Visit (INDEPENDENT_AMBULATORY_CARE_PROVIDER_SITE_OTHER): Payer: Medicare Other | Admitting: *Deleted

## 2013-01-14 DIAGNOSIS — Z8673 Personal history of transient ischemic attack (TIA), and cerebral infarction without residual deficits: Secondary | ICD-10-CM

## 2013-01-14 DIAGNOSIS — I4891 Unspecified atrial fibrillation: Secondary | ICD-10-CM

## 2013-01-14 DIAGNOSIS — I480 Paroxysmal atrial fibrillation: Secondary | ICD-10-CM

## 2013-01-14 DIAGNOSIS — Z7901 Long term (current) use of anticoagulants: Secondary | ICD-10-CM

## 2013-01-14 LAB — POCT INR: INR: 2.1

## 2013-01-15 ENCOUNTER — Telehealth: Payer: Self-pay | Admitting: Cardiology

## 2013-01-15 NOTE — Telephone Encounter (Signed)
Need to talk to Bianca White about cardiac rehabilitation at [facility] is not accepting Medicare patients at this time. We need to find Out where she wants to go for her rehab.

## 2013-01-16 NOTE — Telephone Encounter (Signed)
Will send patient to [facility].  [facility]) aware.

## 2013-01-21 ENCOUNTER — Ambulatory Visit (INDEPENDENT_AMBULATORY_CARE_PROVIDER_SITE_OTHER): Payer: Medicare Other | Admitting: *Deleted

## 2013-01-21 DIAGNOSIS — Z8673 Personal history of transient ischemic attack (TIA), and cerebral infarction without residual deficits: Secondary | ICD-10-CM

## 2013-01-21 DIAGNOSIS — I4891 Unspecified atrial fibrillation: Secondary | ICD-10-CM

## 2013-01-21 DIAGNOSIS — I480 Paroxysmal atrial fibrillation: Secondary | ICD-10-CM

## 2013-01-21 DIAGNOSIS — Z7901 Long term (current) use of anticoagulants: Secondary | ICD-10-CM

## 2013-01-21 LAB — POCT INR: INR: 3.1

## 2013-01-31 ENCOUNTER — Ambulatory Visit (INDEPENDENT_AMBULATORY_CARE_PROVIDER_SITE_OTHER): Payer: Medicare Other | Admitting: *Deleted

## 2013-01-31 DIAGNOSIS — I480 Paroxysmal atrial fibrillation: Secondary | ICD-10-CM

## 2013-01-31 DIAGNOSIS — I4891 Unspecified atrial fibrillation: Secondary | ICD-10-CM

## 2013-01-31 DIAGNOSIS — Z7901 Long term (current) use of anticoagulants: Secondary | ICD-10-CM

## 2013-01-31 DIAGNOSIS — Z8673 Personal history of transient ischemic attack (TIA), and cerebral infarction without residual deficits: Secondary | ICD-10-CM

## 2013-01-31 LAB — POCT INR: INR: 3.6

## 2013-02-14 ENCOUNTER — Ambulatory Visit (INDEPENDENT_AMBULATORY_CARE_PROVIDER_SITE_OTHER): Payer: Medicare Other | Admitting: *Deleted

## 2013-02-14 DIAGNOSIS — Z7901 Long term (current) use of anticoagulants: Secondary | ICD-10-CM

## 2013-02-14 DIAGNOSIS — Z8673 Personal history of transient ischemic attack (TIA), and cerebral infarction without residual deficits: Secondary | ICD-10-CM

## 2013-02-14 DIAGNOSIS — I480 Paroxysmal atrial fibrillation: Secondary | ICD-10-CM

## 2013-02-14 DIAGNOSIS — I4891 Unspecified atrial fibrillation: Secondary | ICD-10-CM

## 2013-02-21 ENCOUNTER — Ambulatory Visit (INDEPENDENT_AMBULATORY_CARE_PROVIDER_SITE_OTHER): Payer: Medicare Other | Admitting: Urology

## 2013-02-21 DIAGNOSIS — N3020 Other chronic cystitis without hematuria: Secondary | ICD-10-CM

## 2013-02-21 DIAGNOSIS — N952 Postmenopausal atrophic vaginitis: Secondary | ICD-10-CM

## 2013-02-28 ENCOUNTER — Ambulatory Visit (INDEPENDENT_AMBULATORY_CARE_PROVIDER_SITE_OTHER): Payer: Medicare Other | Admitting: *Deleted

## 2013-02-28 DIAGNOSIS — I480 Paroxysmal atrial fibrillation: Secondary | ICD-10-CM

## 2013-02-28 DIAGNOSIS — Z7901 Long term (current) use of anticoagulants: Secondary | ICD-10-CM

## 2013-02-28 DIAGNOSIS — I4891 Unspecified atrial fibrillation: Secondary | ICD-10-CM

## 2013-02-28 DIAGNOSIS — Z8673 Personal history of transient ischemic attack (TIA), and cerebral infarction without residual deficits: Secondary | ICD-10-CM

## 2013-03-05 ENCOUNTER — Encounter: Payer: Self-pay | Admitting: Cardiology

## 2013-03-05 ENCOUNTER — Ambulatory Visit (INDEPENDENT_AMBULATORY_CARE_PROVIDER_SITE_OTHER): Payer: Medicare Other | Admitting: Cardiology

## 2013-03-05 VITALS — BP 111/75 | HR 66 | Ht 61.0 in | Wt 198.0 lb

## 2013-03-05 DIAGNOSIS — E785 Hyperlipidemia, unspecified: Secondary | ICD-10-CM

## 2013-03-05 DIAGNOSIS — I2510 Atherosclerotic heart disease of native coronary artery without angina pectoris: Secondary | ICD-10-CM

## 2013-03-05 DIAGNOSIS — I480 Paroxysmal atrial fibrillation: Secondary | ICD-10-CM

## 2013-03-05 DIAGNOSIS — I4891 Unspecified atrial fibrillation: Secondary | ICD-10-CM

## 2013-03-05 DIAGNOSIS — I10 Essential (primary) hypertension: Secondary | ICD-10-CM

## 2013-03-05 NOTE — Assessment & Plan Note (Signed)
Continues on statin therapy, keep followup with primary care provider.

## 2013-03-05 NOTE — Patient Instructions (Addendum)
Your physician recommends that you schedule a follow-up appointment in: 3 months with Dr. Domenic Polite.  Your physician recommends that you continue on your current medications as directed. Please refer to the Current Medication list given to you today.

## 2013-03-05 NOTE — Progress Notes (Signed)
Clinical Summary Bianca White is a 67 y.o.female last seen in the office by Mr. Serpe PAC in March of this year. At that time Coumadin was initiated for treatment of PAF with relatively high thromboembolic risk score, and plan to continue Plavix for a full year following DES, although off aspirin at the current time.  She presents today without complaints of angina. Has had some intermittent leg edema, reports that this recently has been better. She suffers with chronic intermittent lower back pain, tells me that she has seen a spine specialist in [place] who recommended course of injections. This has not yet been undertaken. She is using a cane to ambulate.  Reports no bleeding problems on Plavix and Coumadin. Lipids are followed by her primary care physician.   No Known Allergies  Current Outpatient Prescriptions  Medication Sig Dispense Refill  . allopurinol (ZYLOPRIM) 100 MG tablet Take 1 tablet by mouth daily as needed.      Marland Kitchen amLODipine-benazepril (LOTREL) 5-20 MG per capsule Take 1 capsule by mouth daily.      . cholecalciferol (VITAMIN D) 1000 UNITS tablet Take 1,000 Units by mouth daily.      . clopidogrel (PLAVIX) 75 MG tablet Take 1 tablet (75 mg total) by mouth daily.  30 tablet  3  . Coenzyme Q10 (COQ-10) 200 MG CAPS Take 1 capsule by mouth daily.      . fish oil-omega-3 fatty acids 1000 MG capsule Take 1 g by mouth daily.      Marland Kitchen HYDROcodone-acetaminophen (NORCO/VICODIN) 5-325 MG per tablet Take 1 tablet by mouth every 4 (four) hours as needed for pain.      . metoprolol tartrate (LOPRESSOR) 12.5 mg TABS Take 0.5 tablets (12.5 mg total) by mouth 2 (two) times daily.  30 tablet  3  . nitroGLYCERIN (NITROSTAT) 0.4 MG SL tablet Place 1 tablet (0.4 mg total) under the tongue every 5 (five) minutes x 3 doses as needed for chest pain.  25 tablet  3  . simvastatin (ZOCOR) 10 MG tablet Take 10 mg by mouth at bedtime.      . sitaGLIPtan-metformin (JANUMET) 50-1000 MG per tablet Take 1  tablet by mouth 2 (two) times daily with a meal.      . warfarin (COUMADIN) 5 MG tablet Take 1 tablet (5 mg total) by mouth every evening.  30 tablet  3   No current facility-administered medications for this visit.    Past Medical History  Diagnosis Date  . Type 2 diabetes mellitus   . Essential hypertension, benign   . CVA (cerebral infarction)   . Paroxysmal atrial fibrillation     Detected on tele [facility] 3/14   . Hyperlipidemia   . Carotid artery disease     Nonobstructive; < 50% bilateral ICA stenosis, 07/2007.  Marland Kitchen Coronary atherosclerosis of native coronary artery     75% distal LAD, 75% diagonal, DES to mid RCA - March 2014    Social History Ms. Plamondon reports that she has never smoked. She has never used smokeless tobacco. Ms. Hainer reports that she does not drink alcohol.  Review of Systems No palpitations. Stable appetite. Otherwise as outlined.  Physical Examination Filed Vitals:   03/05/13 1422  BP: 111/75  Pulse: 66   Filed Weights   03/05/13 1422  Weight: 198 lb (89.812 kg)   Comfortable at rest. HEENT: Conjunctiva and lids normal, oropharynx clear. Neck: Supple, no elevated JVP or carotid bruits, no thyromegaly. Lungs: Clear to auscultation, nonlabored breathing  at rest. Cardiac: Regular rate and rhythm, no S3 or significant systolic murmur, no pericardial rub. Abdomen: Soft, nontender, bowel sounds present. Extremities: Trace edema, distal pulses 2+. Skin: Warm and dry. Musculoskeletal: No kyphosis. Neuropsychiatric: Alert and oriented x3, affect grossly appropriate.   Problem List and Plan   CAD (coronary artery disease), native coronary artery Symptomatically stable on medical therapy. No changes made to current regimen. Incidentally, patient mentions that spinal injections are being considered for management of chronic low back pain. Would favor not interrupting Plavix for at least 6 months after her intervention with DES to the RCA in March,  this would be to the end of August. She would also need to come off Coumadin temporarily for that type of intervention. Followup was arranged in the next 3 months.  Paroxysmal atrial fibrillation Heart rate regular on examination today, no complaint of palpitations. She continues on Coumadin, will be on Plavix for full year treatment after her DES in March, can then go on low-dose aspirin.  Hyperlipidemia Continues on statin therapy, keep followup with primary care provider.  Essential hypertension, benign Blood pressure is normal today.    Satira Sark, M.D., F.A.C.C.

## 2013-03-05 NOTE — Assessment & Plan Note (Signed)
Heart rate regular on examination today, no complaint of palpitations. She continues on Coumadin, will be on Plavix for full year treatment after her DES in March, can then go on low-dose aspirin.

## 2013-03-05 NOTE — Assessment & Plan Note (Signed)
Blood pressure is normal today. 

## 2013-03-05 NOTE — Assessment & Plan Note (Signed)
Symptomatically stable on medical therapy. No changes made to current regimen. Incidentally, patient mentions that spinal injections are being considered for management of chronic low back pain. Would favor not interrupting Plavix for at least 6 months after her intervention with DES to the RCA in March, this would be to the end of August. She would also need to come off Coumadin temporarily for that type of intervention. Followup was arranged in the next 3 months.

## 2013-03-10 ENCOUNTER — Telehealth: Payer: Self-pay | Admitting: Cardiology

## 2013-03-10 NOTE — Telephone Encounter (Signed)
Patient is calling stating that she is having a bone scan with injection in [place], [place]. Is this going to harm her heart?

## 2013-03-11 NOTE — Telephone Encounter (Signed)
Spoke with patient and she stated that she had 2 white spots on her spine and has been advised to have a bone scan that included getting injections.  Nurse advised patient that she needed to have the facility/provider to contact our office about the exact procedure/test she is having. Patient verbalized understanding of plan.

## 2013-03-12 ENCOUNTER — Ambulatory Visit (INDEPENDENT_AMBULATORY_CARE_PROVIDER_SITE_OTHER): Payer: Medicare Other | Admitting: *Deleted

## 2013-03-12 DIAGNOSIS — I4891 Unspecified atrial fibrillation: Secondary | ICD-10-CM

## 2013-03-12 DIAGNOSIS — I480 Paroxysmal atrial fibrillation: Secondary | ICD-10-CM

## 2013-03-12 DIAGNOSIS — Z7901 Long term (current) use of anticoagulants: Secondary | ICD-10-CM

## 2013-03-12 DIAGNOSIS — Z8673 Personal history of transient ischemic attack (TIA), and cerebral infarction without residual deficits: Secondary | ICD-10-CM

## 2013-03-25 ENCOUNTER — Telehealth: Payer: Self-pay | Admitting: Cardiology

## 2013-03-25 MED ORDER — METOPROLOL TARTRATE 12.5 MG HALF TABLET: 12.5000 mg | ORAL_TABLET | Freq: Two times a day (BID) | ORAL | Status: DC

## 2013-03-25 NOTE — Telephone Encounter (Signed)
[place] 25 MG    [facility]

## 2013-03-28 ENCOUNTER — Ambulatory Visit (INDEPENDENT_AMBULATORY_CARE_PROVIDER_SITE_OTHER): Payer: Medicare Other | Admitting: *Deleted

## 2013-03-28 DIAGNOSIS — Z8673 Personal history of transient ischemic attack (TIA), and cerebral infarction without residual deficits: Secondary | ICD-10-CM

## 2013-03-28 DIAGNOSIS — I4891 Unspecified atrial fibrillation: Secondary | ICD-10-CM

## 2013-03-28 DIAGNOSIS — I480 Paroxysmal atrial fibrillation: Secondary | ICD-10-CM

## 2013-03-28 DIAGNOSIS — Z7901 Long term (current) use of anticoagulants: Secondary | ICD-10-CM

## 2013-03-28 LAB — POCT INR: INR: 2.7

## 2013-04-14 ENCOUNTER — Telehealth: Payer: Self-pay | Admitting: *Deleted

## 2013-04-14 NOTE — Telephone Encounter (Signed)
MD K. Informed and suggest cutting metoprolol to daily.

## 2013-04-14 NOTE — Telephone Encounter (Signed)
Patient informed and stated that she does have an appointment with her PCP tomorrow at 2:00 pm. Nurse advised patient that she can speak with PCP before making any changes if she wanted to.

## 2013-04-14 NOTE — Telephone Encounter (Signed)
Patient c/o swelling in her face, feet, and ankles since starting metoprolol. Patient says its gotten worse now. Patient said her weight has increased about 6-10 pounds. Patient denies dizziness, chest pain or sob. Patient purchased some fluid medication from someone that she thinks is furosemide and said that it has helped some. Nurse advised patient that she should keep her legs elevated as much as possible, call her PCP and request an appointment. Nurse advised patient that Domenic Polite is out of the office and the DOD would be notified for suggestions.

## 2013-04-18 ENCOUNTER — Ambulatory Visit (INDEPENDENT_AMBULATORY_CARE_PROVIDER_SITE_OTHER): Payer: Medicare Other | Admitting: *Deleted

## 2013-04-18 DIAGNOSIS — I480 Paroxysmal atrial fibrillation: Secondary | ICD-10-CM

## 2013-04-18 DIAGNOSIS — Z7901 Long term (current) use of anticoagulants: Secondary | ICD-10-CM

## 2013-04-18 DIAGNOSIS — Z8673 Personal history of transient ischemic attack (TIA), and cerebral infarction without residual deficits: Secondary | ICD-10-CM

## 2013-04-18 DIAGNOSIS — I4891 Unspecified atrial fibrillation: Secondary | ICD-10-CM

## 2013-04-18 LAB — POCT INR: INR: 1.9

## 2013-04-22 ENCOUNTER — Telehealth: Payer: Self-pay | Admitting: Cardiology

## 2013-04-22 NOTE — Telephone Encounter (Signed)
Face and feet really puffy. Can not put her shoes on for the past three months.  She went to her PCP 2-3 weeks ago and they changed her fluid pills and told her to contact us.

## 2013-04-23 ENCOUNTER — Telehealth: Payer: Self-pay | Admitting: *Deleted

## 2013-04-23 NOTE — Telephone Encounter (Signed)
Patient was given fluid pill by Dr. Marveen Reeks ,furosemide 40 mg daily to start for her swelling. Patient c/o a lot swelling in legs, and now face area. Patient also c/o weakness and did see her PCP yesterday again. Patient thinks the the bystolic 5 mg is causing her swelling and said she didn't have a problem with this before. Patient said that her vitals were normal yesterday and she had cxr, and lab work done by PCP. Nurse advised patient to have results sent to our office. No dizziness, sob, or chest pain. Nurse advised patient that she would need to stop this in order to determine if its coming from the medication. Nurse advised patient that MD would be informed for recommendation.

## 2013-04-23 NOTE — Telephone Encounter (Signed)
Since she seemed to tolerate the metoprolol better, why don't  we switch back to metoprolol and stop the bystolic.

## 2013-04-23 NOTE — Telephone Encounter (Signed)
Aline August NP at PCP office changed from metoprolol to bystolic on [id number]. Patient said her PCP informed her to call our office since she saw them on yesterday.

## 2013-04-23 NOTE — Telephone Encounter (Signed)
Patient was started on antibiotic/tgs

## 2013-04-23 NOTE — Telephone Encounter (Signed)
Reviewed last office note. When I last saw her she was on metoprolol, not Bystolic - when did this change?

## 2013-04-24 NOTE — Telephone Encounter (Signed)
Pt went to [facility] ED 8/11.  Dx: diverticulitis  Started on Flagyl x 4 days and Levaquin x 7 days.  Pt to come in tomorrow 8/15 for INR check. Pt verbalized understanding.

## 2013-04-24 NOTE — Telephone Encounter (Signed)
Patient informed and said her swelling was much better today and the swelling in her legs were gone this morning. Patient said the fluid medicine is working. Nurse advised patient that she should probably stay on her current meds and discuss these problems at her upcoming appointment. Patient verbalized understanding of plan.

## 2013-04-25 ENCOUNTER — Ambulatory Visit (INDEPENDENT_AMBULATORY_CARE_PROVIDER_SITE_OTHER): Payer: Medicare Other | Admitting: *Deleted

## 2013-04-25 DIAGNOSIS — I4891 Unspecified atrial fibrillation: Secondary | ICD-10-CM

## 2013-04-25 DIAGNOSIS — Z8673 Personal history of transient ischemic attack (TIA), and cerebral infarction without residual deficits: Secondary | ICD-10-CM

## 2013-04-25 DIAGNOSIS — Z7901 Long term (current) use of anticoagulants: Secondary | ICD-10-CM

## 2013-04-25 DIAGNOSIS — I480 Paroxysmal atrial fibrillation: Secondary | ICD-10-CM

## 2013-04-29 ENCOUNTER — Other Ambulatory Visit: Payer: Self-pay | Admitting: Physician Assistant

## 2013-05-16 ENCOUNTER — Ambulatory Visit (INDEPENDENT_AMBULATORY_CARE_PROVIDER_SITE_OTHER): Payer: Medicare Other | Admitting: *Deleted

## 2013-05-16 DIAGNOSIS — I480 Paroxysmal atrial fibrillation: Secondary | ICD-10-CM

## 2013-05-16 DIAGNOSIS — I4891 Unspecified atrial fibrillation: Secondary | ICD-10-CM

## 2013-05-16 DIAGNOSIS — Z7901 Long term (current) use of anticoagulants: Secondary | ICD-10-CM

## 2013-05-16 DIAGNOSIS — Z8673 Personal history of transient ischemic attack (TIA), and cerebral infarction without residual deficits: Secondary | ICD-10-CM

## 2013-05-16 LAB — POCT INR: INR: 3

## 2013-05-23 ENCOUNTER — Ambulatory Visit (INDEPENDENT_AMBULATORY_CARE_PROVIDER_SITE_OTHER): Payer: Medicare Other | Admitting: Urology

## 2013-05-23 DIAGNOSIS — N3020 Other chronic cystitis without hematuria: Secondary | ICD-10-CM

## 2013-05-23 DIAGNOSIS — N952 Postmenopausal atrophic vaginitis: Secondary | ICD-10-CM

## 2013-06-06 ENCOUNTER — Ambulatory Visit: Payer: Medicare Other | Admitting: Cardiology

## 2013-06-11 ENCOUNTER — Ambulatory Visit (INDEPENDENT_AMBULATORY_CARE_PROVIDER_SITE_OTHER): Payer: Medicare Other | Admitting: Cardiology

## 2013-06-11 ENCOUNTER — Encounter: Payer: Self-pay | Admitting: Cardiology

## 2013-06-11 ENCOUNTER — Ambulatory Visit (INDEPENDENT_AMBULATORY_CARE_PROVIDER_SITE_OTHER): Payer: Medicare Other | Admitting: *Deleted

## 2013-06-11 VITALS — BP 124/79 | HR 75 | Ht 61.0 in | Wt 200.0 lb

## 2013-06-11 DIAGNOSIS — Z7901 Long term (current) use of anticoagulants: Secondary | ICD-10-CM

## 2013-06-11 DIAGNOSIS — I10 Essential (primary) hypertension: Secondary | ICD-10-CM

## 2013-06-11 DIAGNOSIS — I4891 Unspecified atrial fibrillation: Secondary | ICD-10-CM

## 2013-06-11 DIAGNOSIS — I480 Paroxysmal atrial fibrillation: Secondary | ICD-10-CM

## 2013-06-11 DIAGNOSIS — Z8673 Personal history of transient ischemic attack (TIA), and cerebral infarction without residual deficits: Secondary | ICD-10-CM

## 2013-06-11 DIAGNOSIS — I2510 Atherosclerotic heart disease of native coronary artery without angina pectoris: Secondary | ICD-10-CM

## 2013-06-11 MED ORDER — ATENOLOL 25 MG PO TABS: 25.0000 mg | ORAL_TABLET | Freq: Every day | ORAL | Status: DC

## 2013-06-11 NOTE — Assessment & Plan Note (Signed)
Blood pressure control is good today. 

## 2013-06-11 NOTE — Progress Notes (Signed)
Clinical Summary Bianca White is a 67 y.o.female last seen in June. Multiple telephone communications reviewed since the last visit. Her medications have been modified. Also had ER visit at [facility] secondary to diverticulitis.  She tells me that she still feels intermittent swelling in her legs, also arms, right more so than left. She attributes this to metoprolol, and now even still notices on Bystolic. She feels somewhat better on a diuretic.  Weight is up 2 pounds from June. She reports no significant chest pain, no progressive shortness of breath.  We did review her medications. We discussed alternatives to Bystolic. She states that she tolerated Atenolol years ago.  No Known Allergies  Current Outpatient Prescriptions  Medication Sig Dispense Refill  . allopurinol (ZYLOPRIM) 100 MG tablet Take 1 tablet by mouth daily as needed.      Marland Kitchen amLODipine-benazepril (LOTREL) 5-20 MG per capsule Take 1 capsule by mouth daily.      . cholecalciferol (VITAMIN D) 1000 UNITS tablet Take 1,000 Units by mouth daily.      . clopidogrel (PLAVIX) 75 MG tablet Take 1 tablet (75 mg total) by mouth daily.  30 tablet  3  . Coenzyme Q10 (COQ-10) 200 MG CAPS Take 1 capsule by mouth daily.      . fish oil-omega-3 fatty acids 1000 MG capsule Take 1 g by mouth daily.      . furosemide (LASIX) 40 MG tablet Take 40 mg by mouth as needed.       . Liraglutide (VICTOZA [place]) Inject 12 Units into the skin daily.      . metFORMIN (GLUCOPHAGE) 500 MG tablet       . nitroGLYCERIN (NITROSTAT) 0.4 MG SL tablet Place 1 tablet (0.4 mg total) under the tongue every 5 (five) minutes x 3 doses as needed for chest pain.  25 tablet  3  . simvastatin (ZOCOR) 10 MG tablet Take 10 mg by mouth at bedtime.      . traMADol (ULTRAM) 50 MG tablet Take 50 mg by mouth every 8 (eight) hours as needed.       . warfarin (COUMADIN) 5 MG tablet TAKE ONE TABLET BY MOUTH IN THE EVENING  30 tablet  3  . atenolol (TENORMIN) 25 MG tablet Take 1  tablet (25 mg total) by mouth daily.  30 tablet  6   No current facility-administered medications for this visit.    Past Medical History  Diagnosis Date  . Type 2 diabetes mellitus   . Essential hypertension, benign   . CVA (cerebral infarction)   . Paroxysmal atrial fibrillation     Detected on tele [facility] 3/14   . Hyperlipidemia   . Carotid artery disease     Nonobstructive; < 50% bilateral ICA stenosis, 07/2007.  Marland Kitchen Coronary atherosclerosis of native coronary artery     75% distal LAD, 75% diagonal, DES to mid RCA - March 2014    Social History Bianca White reports that she has never smoked. She has never used smokeless tobacco. Bianca White reports that she does not drink alcohol.  Review of Systems Reports improvement in her back pain, is able to walk without a cane. Otherwise negative.  Physical Examination Filed Vitals:   06/11/13 1041  BP: 124/79  Pulse: 75   Filed Weights   06/11/13 1041  Weight: 200 lb (90.719 kg)    Comfortable at rest.  HEENT: Conjunctiva and lids normal, oropharynx clear.  Neck: Supple, no elevated JVP or carotid bruits, no thyromegaly.  Lungs: Clear to auscultation, nonlabored breathing at rest.  Cardiac: Regular rate and rhythm, no S3 or significant systolic murmur, no pericardial rub.  Abdomen: Soft, nontender, bowel sounds present.  Extremities: Trace edema, distal pulses 2+.  Skin: Warm and dry.  Musculoskeletal: No kyphosis.  Neuropsychiatric: Alert and oriented x3, affect grossly appropriate.   Problem List and Plan   CAD (coronary artery disease), native coronary artery Symptomatically stable. She still reports problems with intermittent edema. Would continue diuretic which was added by her primary care provider. We will switch Bystolic to atenolol 25 mg daily. She continues on Plavix, no aspirin since she is on Coumadin for PAF. At this point he tells me that no intervention for her back pain is planned. Followup  arranged.  Essential hypertension, benign Blood pressure control is good today.  Paroxysmal atrial fibrillation She continues on Coumadin.    Satira Sark, M.D., F.A.C.C.

## 2013-06-11 NOTE — Assessment & Plan Note (Signed)
She continues on Coumadin.

## 2013-06-11 NOTE — Assessment & Plan Note (Addendum)
Symptomatically stable. She still reports problems with intermittent edema. Would continue diuretic which was added by her primary care provider. We will switch Bystolic to atenolol 25 mg daily. She continues on Plavix, no aspirin since she is on Coumadin for PAF. At this point he tells me that no intervention for her back pain is planned. Followup arranged.

## 2013-06-11 NOTE — Patient Instructions (Signed)
   Stop Bystolic  Begin Atenolol 25mg  daily - new sent to pharm Continue all other medications.   Follow up in  3 months

## 2013-06-12 LAB — POCT INR
INR: 3.3
INR: 3.3

## 2013-06-27 ENCOUNTER — Ambulatory Visit (INDEPENDENT_AMBULATORY_CARE_PROVIDER_SITE_OTHER): Payer: Medicare Other | Admitting: *Deleted

## 2013-06-27 DIAGNOSIS — Z8673 Personal history of transient ischemic attack (TIA), and cerebral infarction without residual deficits: Secondary | ICD-10-CM

## 2013-06-27 DIAGNOSIS — Z7901 Long term (current) use of anticoagulants: Secondary | ICD-10-CM

## 2013-06-27 DIAGNOSIS — I4891 Unspecified atrial fibrillation: Secondary | ICD-10-CM

## 2013-06-27 DIAGNOSIS — I480 Paroxysmal atrial fibrillation: Secondary | ICD-10-CM

## 2013-07-14 ENCOUNTER — Encounter (INDEPENDENT_AMBULATORY_CARE_PROVIDER_SITE_OTHER): Payer: Self-pay

## 2013-07-14 ENCOUNTER — Ambulatory Visit (INDEPENDENT_AMBULATORY_CARE_PROVIDER_SITE_OTHER): Payer: Medicare Other | Admitting: Nurse Practitioner

## 2013-07-14 ENCOUNTER — Encounter: Payer: Self-pay | Admitting: Nurse Practitioner

## 2013-07-14 VITALS — BP 117/69 | HR 68 | Ht 61.25 in | Wt 202.0 lb

## 2013-07-14 DIAGNOSIS — IMO0002 Reserved for concepts with insufficient information to code with codable children: Secondary | ICD-10-CM

## 2013-07-14 DIAGNOSIS — I63239 Cerebral infarction due to unspecified occlusion or stenosis of unspecified carotid arteries: Secondary | ICD-10-CM

## 2013-07-14 NOTE — Progress Notes (Signed)
[facility]  PATIENT: Bianca White DOB: Apr 13, 1946   REASON FOR VISIT: Followup for history of left middle cerebral artery scattered infarcts  HISTORY OF PRESENT ILLNESS: Ms Bianca White, 67 year old female returns for followup. She was last seen 01/10/2013. 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 She was given two thirds does IV TPA and underwent clot extraction by Dr. Estanislado White with complete recanalization. She had recent cardiac stent in March 2014. She has had no new TIA or stroke symptoms. She denies double vision, loss of vision, focal weakness, numbness, speech or swallowing problems, confusions, blackouts. Last carotid Doppler 02/20/2012 was negative for stenosis. She has been hospitalized for diverticulitis since last seen. She also has a long history of back pain but is unable to get steroid injections.    REVIEW OF SYSTEMS: Full 14 system review of systems performed and notable only for:  Constitutional: N/A  Cardiovascular: Swelling in the legs  Ear/Nose/Throat: N/A  Skin: N/A  Eyes: N/A  Respiratory: N/A  Gastroitestinal: N/A  Hematology/Lymphatic: N/A  Endocrine: N/A Musculoskeletal:N/A  Allergy/Immunology: N/A  Neurological: N/A Psychiatric: N/A   ALLERGIES: No Known Allergies  HOME MEDICATIONS: Outpatient Prescriptions Prior to Visit  Medication Sig Dispense Refill  . allopurinol (ZYLOPRIM) 100 MG tablet Take 1 tablet by mouth daily as needed.      Marland Kitchen amLODipine-benazepril (LOTREL) 5-20 MG per capsule Take 1 capsule by mouth daily.      Marland Kitchen atenolol (TENORMIN) 25 MG tablet Take 1 tablet (25 mg total) by mouth daily.  30 tablet  6  . cholecalciferol (VITAMIN D) 1000 UNITS tablet Take 1,000 Units by mouth daily.      . clopidogrel (PLAVIX) 75 MG tablet Take 1 tablet (75 mg total) by mouth daily.  30 tablet  3  . Coenzyme Q10 (COQ-10) 200 MG CAPS  Take 1 capsule by mouth daily.      . fish oil-omega-3 fatty acids 1000 MG capsule Take 1 g by mouth daily.      . furosemide (LASIX) 40 MG tablet Take 40 mg by mouth as needed.       . metFORMIN (GLUCOPHAGE) 500 MG tablet       . nitroGLYCERIN (NITROSTAT) 0.4 MG SL tablet Place 1 tablet (0.4 mg total) under the tongue every 5 (five) minutes x 3 doses as needed for chest pain.  25 tablet  3  . simvastatin (ZOCOR) 10 MG tablet Take 10 mg by mouth at bedtime.      . traMADol (ULTRAM) 50 MG tablet Take 50 mg by mouth every 8 (eight) hours as needed.       . warfarin (COUMADIN) 5 MG tablet TAKE ONE TABLET BY MOUTH IN THE EVENING  30 tablet  3  . Liraglutide (VICTOZA [place]) Inject 12 Units into the skin daily.       No facility-administered medications prior to visit.    PAST MEDICAL HISTORY: Past Medical History  Diagnosis Date  . Type 2 diabetes mellitus   . Essential hypertension, benign   . CVA (cerebral infarction)   . Paroxysmal atrial fibrillation     Detected on tele [facility] 3/14   . Hyperlipidemia   . Carotid artery disease     Nonobstructive; < 50% bilateral ICA stenosis, 07/2007.  Marland Kitchen Coronary atherosclerosis of native coronary artery     75% distal LAD, 75% diagonal, DES to mid RCA - March 2014  PAST SURGICAL HISTORY: Past Surgical History  Procedure Laterality Date  . Cholecystectomy    . Bilateral carpal tunnel release    . Cervical neck fusion    . Hernia repair    renal stent  FAMILY HISTORY: Family History  Problem Relation Age of Onset  . Stroke Father   . Stroke Mother     SOCIAL HISTORY: History   Social History  . Marital Status: Widowed    Spouse Name: N/A    Number of Children: 1  . Years of Education: 9   Occupational History  . Not on file.   Social History Main Topics  . Smoking status: Never Smoker   . Smokeless tobacco: Never Used  . Alcohol Use: No  . Drug Use: No  . Sexual Activity: Not on file   Other Topics Concern  . Not on file    Social History Narrative   Patient is a widow and lives alone.   Patient has one child.   Patient is retired.   Patient is right-handed.   Patient drinks one cup of coffee in the am, sometimes.   Patient has a high school education.     PHYSICAL EXAM  Filed Vitals:   07/14/13 1342  BP: 119/78  Pulse: 68  Height: 5' 1.25" (1.556 m)  Weight: 202 lb (91.627 kg)   Body mass index is 37.84 kg/(m^2).  Generalized: Well developed, in no acute distress  Head: normocephalic and atraumatic,. Oropharynx benign  Neck: Supple, no carotid bruits  Cardiac: Regular rate rhythm, no murmur   Neurological examination   Mentation: Alert oriented to time, place, history taking. Follows all commands speech and language fluent  Cranial nerve II-XII: .Pupils were equal round reactive to light extraocular movements were full, visual field were full on confrontational test. Facial sensation and strength were normal. hearing was intact to finger rubbing bilaterally. Uvula tongue midline. head turning and shoulder shrug and were normal and symmetric.Tongue protrusion into cheek strength was normal. Motor: normal bulk and tone, full strength in the BUE, BLE, fine finger movements normal, no pronator drift. No focal weakness Sensory: normal and symmetric to light touch, pinprick, and  vibration  Coordination: finger-nose-finger, heel-to-shin bilaterally, no dysmetria Reflexes: 1+ upper and lower and symmetric. Gait and Station: Rising up from seated position without assistance, normal stance, moderate stride, good arm swing, smooth turning, able to perform tiptoe, and heel walking without difficulty. Mild difficulty with tandem, no assistive device  DIAGNOSTIC DATA (LABS, IMAGING, TESTING) - I reviewed patient records, labs, notes, testing and imaging myself where available.  Lab Results  Component Value Date   WBC 6.8 11/26/2012   HGB 10.4* 11/26/2012   HCT 31.8* 11/26/2012   MCV 85.9 11/26/2012    PLT 319 11/26/2012      Component Value Date/Time   NA 140 11/26/2012 0638   K 4.0 11/26/2012 0638   CL 105 11/26/2012 0638   CO2 28 11/26/2012 0638   GLUCOSE 135* 11/26/2012 0638   BUN 23 11/26/2012 0638   CREATININE 1.03 11/26/2012 0638   CALCIUM 9.0 11/26/2012 0638   PROT 6.0 05/05/2009 0620   ALBUMIN 2.8* 05/05/2009 0620   AST 22 05/05/2009 0620   ALT 22 05/05/2009 0620   ALKPHOS 65 05/05/2009 0620   BILITOT 0.7 05/05/2009 0620   GFRNONAA 54* 11/26/2012 0638   GFRAA 63* 11/26/2012 [id number]    ASSESSMENT AND PLAN  67 y.o. year old female  has a past medical history of Type 2  diabetes mellitus; Essential hypertension, benign; CVA (cerebral infarction); Paroxysmal atrial fibrillation; Hyperlipidemia; Carotid artery disease; and Coronary atherosclerosis of native coronary artery. here to followup. She is stable from a neurovascular standpoint  Continue Plavix for secondary stroke prevention Continue Coumadin as directed by cardiologist Blood pressure is excellent LDL below 70 Followup in 6 months Dennie Bible, [facility], [facility], [facility] [address] [place], [place] [address] [phone]

## 2013-07-14 NOTE — Patient Instructions (Signed)
Continue Plavix for secondary stroke prevention Continue Coumadin as directed by cardiologist Blood pressure is excellent LDL below 70 Followup in 6 months

## 2013-07-15 ENCOUNTER — Ambulatory Visit (INDEPENDENT_AMBULATORY_CARE_PROVIDER_SITE_OTHER): Payer: Medicare Other | Admitting: *Deleted

## 2013-07-15 DIAGNOSIS — I4891 Unspecified atrial fibrillation: Secondary | ICD-10-CM

## 2013-07-15 DIAGNOSIS — I480 Paroxysmal atrial fibrillation: Secondary | ICD-10-CM

## 2013-07-15 DIAGNOSIS — Z7901 Long term (current) use of anticoagulants: Secondary | ICD-10-CM

## 2013-07-15 DIAGNOSIS — Z8673 Personal history of transient ischemic attack (TIA), and cerebral infarction without residual deficits: Secondary | ICD-10-CM

## 2013-07-15 LAB — POCT INR: INR: 3.1

## 2013-07-29 ENCOUNTER — Telehealth: Payer: Self-pay | Admitting: Cardiology

## 2013-07-29 NOTE — Telephone Encounter (Signed)
Patient is followed in the Coumadin clinic, will forward for review. Would generally recommend stopping 4 days prior to the procedure.

## 2013-07-29 NOTE — Telephone Encounter (Signed)
Patient is having a procedure on Thursday involving kidney stones. Was told to come off coumdin. She wants to know how many Days will she need to be off the coumdin.

## 2013-07-29 NOTE — Telephone Encounter (Signed)
Spoke with pt.  She took last dose of coumadin on Sunday night.  Has pre-op scheduled for tomorrow.  Old her to inform nurses she is off coumadin but is still taking plavix.  Will call with fax number if hospital/MD needs written documentation.

## 2013-08-05 ENCOUNTER — Ambulatory Visit (INDEPENDENT_AMBULATORY_CARE_PROVIDER_SITE_OTHER): Payer: Medicare Other | Admitting: *Deleted

## 2013-08-05 DIAGNOSIS — I480 Paroxysmal atrial fibrillation: Secondary | ICD-10-CM

## 2013-08-05 DIAGNOSIS — Z8673 Personal history of transient ischemic attack (TIA), and cerebral infarction without residual deficits: Secondary | ICD-10-CM

## 2013-08-05 DIAGNOSIS — Z7901 Long term (current) use of anticoagulants: Secondary | ICD-10-CM

## 2013-08-05 DIAGNOSIS — I4891 Unspecified atrial fibrillation: Secondary | ICD-10-CM

## 2013-08-19 ENCOUNTER — Ambulatory Visit (INDEPENDENT_AMBULATORY_CARE_PROVIDER_SITE_OTHER): Payer: Medicare Other | Admitting: *Deleted

## 2013-08-19 DIAGNOSIS — I480 Paroxysmal atrial fibrillation: Secondary | ICD-10-CM

## 2013-08-19 DIAGNOSIS — Z8673 Personal history of transient ischemic attack (TIA), and cerebral infarction without residual deficits: Secondary | ICD-10-CM

## 2013-08-19 DIAGNOSIS — Z7901 Long term (current) use of anticoagulants: Secondary | ICD-10-CM

## 2013-08-19 DIAGNOSIS — I4891 Unspecified atrial fibrillation: Secondary | ICD-10-CM

## 2013-08-19 LAB — POCT INR: INR: 2.6

## 2013-09-08 ENCOUNTER — Other Ambulatory Visit: Payer: Self-pay | Admitting: Cardiology

## 2013-09-08 MED ORDER — WARFARIN SODIUM 5 MG PO TABS: ORAL_TABLET | ORAL | Status: DC

## 2013-09-16 ENCOUNTER — Ambulatory Visit (INDEPENDENT_AMBULATORY_CARE_PROVIDER_SITE_OTHER): Payer: Medicare Other | Admitting: Cardiology

## 2013-09-16 ENCOUNTER — Encounter: Payer: Self-pay | Admitting: Cardiology

## 2013-09-16 ENCOUNTER — Ambulatory Visit (INDEPENDENT_AMBULATORY_CARE_PROVIDER_SITE_OTHER): Payer: Medicare Other | Admitting: *Deleted

## 2013-09-16 VITALS — BP 125/78 | HR 75 | Ht 61.0 in | Wt 208.0 lb

## 2013-09-16 DIAGNOSIS — Z8673 Personal history of transient ischemic attack (TIA), and cerebral infarction without residual deficits: Secondary | ICD-10-CM

## 2013-09-16 DIAGNOSIS — Z7901 Long term (current) use of anticoagulants: Secondary | ICD-10-CM

## 2013-09-16 DIAGNOSIS — I480 Paroxysmal atrial fibrillation: Secondary | ICD-10-CM

## 2013-09-16 DIAGNOSIS — I4891 Unspecified atrial fibrillation: Secondary | ICD-10-CM

## 2013-09-16 DIAGNOSIS — I2510 Atherosclerotic heart disease of native coronary artery without angina pectoris: Secondary | ICD-10-CM

## 2013-09-16 DIAGNOSIS — I10 Essential (primary) hypertension: Secondary | ICD-10-CM

## 2013-09-16 DIAGNOSIS — R609 Edema, unspecified: Secondary | ICD-10-CM

## 2013-09-16 LAB — POCT INR: INR: 3.5

## 2013-09-16 MED ORDER — FUROSEMIDE 40 MG PO TABS: 40.0000 mg | ORAL_TABLET | ORAL | Status: DC

## 2013-09-16 NOTE — Assessment & Plan Note (Signed)
Blood pressure is well-controlled today. 

## 2013-09-16 NOTE — Assessment & Plan Note (Signed)
LVEF has been normal over time, by catheterization was 50-55% with inferior basal hypokinesis. Plan is to continue medical therapy, I have asked her to try and take Lasix 40 mg every other day for the time being, followup BMET in 2 weeks. If this leads to improvement in her edema with relatively stable renal function, it may be that she needs to stay on a longer term diuretic. Probably has some element of diastolic dysfunction associated with her CAD and hypertension.

## 2013-09-16 NOTE — Assessment & Plan Note (Signed)
Continues on Coumadin. Heart rate is regular today.

## 2013-09-16 NOTE — Progress Notes (Signed)
Clinical Summary  Bianca White is a 68 y.o.female last seen in October 2014. She underwent interval urological procedure with Dr. Exie Parody in November 2014, removal of a left ureteral stent. Her renal function is stable as noted below.  Lab work from October 2014 showed hemoglobin 10.6, platelets 224. More recent labs in January of this year showed BUN 37, creatinine 1.2  Her main complaint which has been persistent over time is a feeling of swelling, mainly legs and to some degree her hands. She states this has been a problem since March of last year. She has not used Lasix with any regularity due to concerns about CKD.   She does not endorse any angina symptoms, has occasional palpitations. Reports no bleeding problems on Plavix and Coumadin.   No Known Allergies  Current Outpatient Prescriptions  Medication Sig Dispense Refill  . allopurinol (ZYLOPRIM) 100 MG tablet Take 1 tablet by mouth daily as needed.      Marland Kitchen amLODipine-benazepril (LOTREL) 5-20 MG per capsule Take 1 capsule by mouth daily.      Marland Kitchen atenolol (TENORMIN) 25 MG tablet Take 1 tablet (25 mg total) by mouth daily.  30 tablet  6  . cholecalciferol (VITAMIN D) 1000 UNITS tablet Take 1,000 Units by mouth daily.      . clopidogrel (PLAVIX) 75 MG tablet Take 1 tablet (75 mg total) by mouth daily.  30 tablet  3  . Coenzyme Q10 (COQ-10) 200 MG CAPS Take 1 capsule by mouth daily.      . fish oil-omega-3 fatty acids 1000 MG capsule Take 1 g by mouth daily.      . furosemide (LASIX) 40 MG tablet Take 1 tablet (40 mg total) by mouth every other day.  30 tablet  1  . HYDROcodone-acetaminophen (NORCO) 10-325 MG per tablet Take 1 tablet by mouth every 6 (six) hours as needed.       . insulin detemir (LEVEMIR) 100 UNIT/ML injection Inject into the skin at bedtime.      . metFORMIN (GLUCOPHAGE) 500 MG tablet Take 500 mg by mouth 2 (two) times daily with a meal.       . nitroGLYCERIN (NITROSTAT) 0.4 MG SL tablet Place 1 tablet (0.4 mg total)  under the tongue every 5 (five) minutes x 3 doses as needed for chest pain.  25 tablet  3  . NOVOFINE 32G X 6 MM MISC       . simvastatin (ZOCOR) 10 MG tablet Take 10 mg by mouth at bedtime.      Marland Kitchen warfarin (COUMADIN) 5 MG tablet TAKE ONE TABLET BY MOUTH IN THE EVENING  30 tablet  3  . NOVOLIN N RELION 100 UNIT/ML injection        No current facility-administered medications for this visit.    Past Medical History  Diagnosis Date  . Type 2 diabetes mellitus   . Essential hypertension, benign   . CVA (cerebral infarction)   . Paroxysmal atrial fibrillation     Detected on tele [facility] 3/14   . Hyperlipidemia   . Carotid artery disease     Nonobstructive; < 50% bilateral ICA stenosis, 07/2007.  Marland Kitchen Coronary atherosclerosis of native coronary artery     75% distal LAD, 75% diagonal, DES to mid RCA - March 2014    Social History Bianca White reports that she has never smoked. She has never used smokeless tobacco. Bianca White reports that she does not drink alcohol.  Review of Systems Negative except as  outlined above.  Physical Examination Filed Vitals:   09/16/13 1059  BP: 125/78  Pulse: 75   Filed Weights   09/16/13 1059  Weight: 208 lb (94.348 kg)    Comfortable at rest.  HEENT: Conjunctiva and lids normal, oropharynx clear.  Neck: Supple, no elevated JVP or carotid bruits, no thyromegaly.  Lungs: Clear to auscultation, nonlabored breathing at rest.  Cardiac: Regular rate and rhythm, no S3 or significant systolic murmur, no pericardial rub.  Abdomen: Soft, nontender, bowel sounds present.  Extremities: 1-2+ edema, distal pulses 2+.  Skin: Warm and dry.  Musculoskeletal: No kyphosis.  Neuropsychiatric: Alert and oriented x3, affect grossly appropriate.   Problem List and Plan   Edema LVEF has been normal over time, by catheterization was 50-55% with inferior basal hypokinesis. Plan is to continue medical therapy, I have asked her to try and take Lasix 40 mg every  other day for the time being, followup BMET in 2 weeks. If this leads to improvement in her edema with relatively stable renal function, it may be that she needs to stay on a longer term diuretic. Probably has some element of diastolic dysfunction associated with her CAD and hypertension.  CAD (coronary artery disease), native coronary artery Status post DES to the RCA in March 2014. We should be able to stop Plavix in March of this year.  Essential hypertension, benign Blood pressure is well-controlled today.  Paroxysmal atrial fibrillation Continues on Coumadin. Heart rate is regular today.    Bianca White, M.D., F.A.C.C.

## 2013-09-16 NOTE — Assessment & Plan Note (Signed)
Status post DES to the RCA in March 2014. We should be able to stop Plavix in March of this year.

## 2013-09-16 NOTE — Patient Instructions (Signed)
Your physician recommends that you schedule a follow-up appointment in: 1 month. Your physician has recommended you make the following change in your medication: Increase your furosemide 40 mg to every other day. Your new prescription has been sent to your pharmacy. All other medications will remain the same. Your physician recommends that you have lab work in 2 weeks around September 30, 2013 to check your BMET.

## 2013-10-14 ENCOUNTER — Ambulatory Visit (INDEPENDENT_AMBULATORY_CARE_PROVIDER_SITE_OTHER): Payer: Medicare Other | Admitting: *Deleted

## 2013-10-14 DIAGNOSIS — Z8673 Personal history of transient ischemic attack (TIA), and cerebral infarction without residual deficits: Secondary | ICD-10-CM

## 2013-10-14 DIAGNOSIS — Z5181 Encounter for therapeutic drug level monitoring: Secondary | ICD-10-CM

## 2013-10-14 DIAGNOSIS — I480 Paroxysmal atrial fibrillation: Secondary | ICD-10-CM

## 2013-10-14 DIAGNOSIS — Z7901 Long term (current) use of anticoagulants: Secondary | ICD-10-CM

## 2013-10-14 DIAGNOSIS — I4891 Unspecified atrial fibrillation: Secondary | ICD-10-CM

## 2013-10-14 LAB — POCT INR: INR: 3.5

## 2013-10-16 ENCOUNTER — Telehealth: Payer: Self-pay | Admitting: *Deleted

## 2013-10-16 NOTE — Telephone Encounter (Signed)
Message copied by Merlene Laughter on Thu Oct 16, 2013  3:47 PM ------      Message from: MCDOWELL, Aloha Gell      Created: Wed Oct 15, 2013  7:19 AM       Reviewed. Potassium and renal function are normal following increase in Lasix dose. Continue same for now. ------

## 2013-10-16 NOTE — Telephone Encounter (Signed)
Patient informed. 

## 2013-10-30 ENCOUNTER — Encounter: Payer: Self-pay | Admitting: Cardiology

## 2013-10-30 ENCOUNTER — Ambulatory Visit (INDEPENDENT_AMBULATORY_CARE_PROVIDER_SITE_OTHER): Payer: Medicare Other | Admitting: Cardiology

## 2013-10-30 VITALS — BP 150/83 | HR 65 | Ht 61.0 in | Wt 208.0 lb

## 2013-10-30 DIAGNOSIS — I480 Paroxysmal atrial fibrillation: Secondary | ICD-10-CM

## 2013-10-30 DIAGNOSIS — E785 Hyperlipidemia, unspecified: Secondary | ICD-10-CM

## 2013-10-30 DIAGNOSIS — I10 Essential (primary) hypertension: Secondary | ICD-10-CM

## 2013-10-30 DIAGNOSIS — I2510 Atherosclerotic heart disease of native coronary artery without angina pectoris: Secondary | ICD-10-CM

## 2013-10-30 DIAGNOSIS — R609 Edema, unspecified: Secondary | ICD-10-CM

## 2013-10-30 DIAGNOSIS — I4891 Unspecified atrial fibrillation: Secondary | ICD-10-CM

## 2013-10-30 NOTE — Patient Instructions (Signed)
Your physician recommends that you schedule a follow-up appointment in: 6 weeks. Your physician recommends that you continue on your current medications as directed. Please refer to the Current Medication list given to you today. 

## 2013-10-30 NOTE — Progress Notes (Signed)
Clinical Summary Bianca White is a 68 y.o.female seen recently in January 2015. Last visit we added Lasix every other day for management of edema which was troubling her. She reports improvement in edema although not resolution, right tends to be more than left. We arrange followup lab work to monitor renal function.  Recent followup lab work showed BUN 21, creatinine 1.1, potassium 4.0. We discussed this today.  She is status post DES to the RCA in March 2014, continues on Plavix along with her Coumadin. We had discussed the possibility that she could come off Plavix after a year treatment following DES, although she tells me that she had been on this longer-term with prior diagnosis of stroke, still followed by Bianca White. She has tolerated both Plavix and Coumadin, is hesitant to discontinue either one.  No Known Allergies  Current Outpatient Prescriptions  Medication Sig Dispense Refill  . allopurinol (ZYLOPRIM) 100 MG tablet Take 1 tablet by mouth daily as needed.      Marland Kitchen amLODipine (NORVASC) 5 MG tablet Take 5 mg by mouth daily.       Marland Kitchen atenolol (TENORMIN) 25 MG tablet Take 1 tablet (25 mg total) by mouth daily.  30 tablet  6  . benazepril (LOTENSIN) 20 MG tablet Take 20 mg by mouth daily.       . cholecalciferol (VITAMIN D) 1000 UNITS tablet Take 1,000 Units by mouth daily.      . clopidogrel (PLAVIX) 75 MG tablet Take 1 tablet (75 mg total) by mouth daily.  30 tablet  3  . Coenzyme Q10 (COQ-10) 200 MG CAPS Take 1 capsule by mouth daily.      . fish oil-omega-3 fatty acids 1000 MG capsule Take 1 g by mouth daily.      . furosemide (LASIX) 40 MG tablet Take 1 tablet (40 mg total) by mouth every other day.  30 tablet  1  . indomethacin (INDOCIN) 50 MG capsule Take 50 mg by mouth 3 (three) times daily as needed.       . insulin detemir (LEVEMIR) 100 UNIT/ML injection Inject into the skin at bedtime.      . metFORMIN (GLUCOPHAGE) 500 MG tablet Take 500 mg by mouth 2 (two) times daily  with a meal.       . nitroGLYCERIN (NITROSTAT) 0.4 MG SL tablet Place 1 tablet (0.4 mg total) under the tongue every 5 (five) minutes x 3 doses as needed for chest pain.  25 tablet  3  . NOVOFINE 32G X 6 MM MISC       . simvastatin (ZOCOR) 10 MG tablet Take 10 mg by mouth at bedtime.      Marland Kitchen warfarin (COUMADIN) 5 MG tablet TAKE ONE TABLET BY MOUTH IN THE EVENING  30 tablet  3   No current facility-administered medications for this visit.    Past Medical History  Diagnosis Date  . Type 2 diabetes mellitus   . Essential hypertension, benign   . CVA (cerebral infarction)   . Paroxysmal atrial fibrillation     Detected on tele [facility] 3/14   . Hyperlipidemia   . Carotid artery disease     Nonobstructive; < 50% bilateral ICA stenosis, 07/2007.  Marland Kitchen Coronary atherosclerosis of native coronary artery     75% distal LAD, 75% diagonal, DES to mid RCA - March 2014    Social History Bianca White reports that she has never smoked. She has never used smokeless tobacco. Bianca White reports that she  does not drink alcohol.  Review of Systems No palpitations or syncope. Stable appetite. No orthopnea or PND. Some trouble with her memory. Otherwise negative.  Physical Examination Filed Vitals:   10/30/13 1045  BP: 150/83  Pulse: 65   Filed Weights   10/30/13 1045  Weight: 208 lb (94.348 kg)    Comfortable at rest.  HEENT: Conjunctiva and lids normal, oropharynx clear.  Neck: Supple, no elevated JVP or carotid bruits, no thyromegaly.  Lungs: Clear to auscultation, nonlabored breathing at rest.  Cardiac: Regular rate and rhythm, no S3 or significant systolic murmur, no pericardial rub.  Abdomen: Soft, nontender, bowel sounds present.  Extremities: 1+ edema, distal pulses 2+.  Skin: Warm and dry.  Musculoskeletal: No kyphosis.  Neuropsychiatric: Alert and oriented x3, affect grossly appropriate.   Problem List and Plan   CAD (coronary artery disease), native coronary artery No active  angina symptoms status post DES to the RCA in March 2014. She continues on Plavix along with her Coumadin for PAF. No bleeding problems. I will forward my note to Bianca White to get his viewpoint on need for longer term Plavix in light of her prior history of stroke. Patient is reluctant to stop Plavix, although from the perspective of her DES she could consider coming off of it after March.  Essential hypertension, benign Blood pressure is elevated today. Keep followup with Bianca White.  Edema Somewhat better with the addition of Lasix 40 mg every other day. Renal function and potassium are stable. We might consider trying 20 mg alternating with 40 mg QOD although for now will make no changes.  Hyperlipidemia She continues on Zocor.  Paroxysmal atrial fibrillation Asymptomatic, continues on Coumadin.    Satira Sark, M.D., F.A.C.C.

## 2013-10-30 NOTE — Assessment & Plan Note (Signed)
Blood pressure is elevated today. Keep followup with Dr. Marveen Reeks.

## 2013-10-30 NOTE — Assessment & Plan Note (Signed)
No active angina symptoms status post DES to the RCA in March 2014. She continues on Plavix along with her Coumadin for PAF. No bleeding problems. I will forward my note to Dr. Leonie Man to get his viewpoint on need for longer term Plavix in light of her prior history of stroke. Patient is reluctant to stop Plavix, although from the perspective of her DES she could consider coming off of it after March.

## 2013-10-30 NOTE — Assessment & Plan Note (Signed)
Somewhat better with the addition of Lasix 40 mg every other day. Renal function and potassium are stable. We might consider trying 20 mg alternating with 40 mg QOD although for now will make no changes.

## 2013-10-30 NOTE — Assessment & Plan Note (Signed)
She continues on Zocor.

## 2013-10-30 NOTE — Assessment & Plan Note (Signed)
Asymptomatic, continues on Coumadin.

## 2013-11-06 ENCOUNTER — Other Ambulatory Visit: Payer: Self-pay | Admitting: *Deleted

## 2013-11-06 DIAGNOSIS — I70219 Atherosclerosis of native arteries of extremities with intermittent claudication, unspecified extremity: Secondary | ICD-10-CM

## 2013-11-10 ENCOUNTER — Ambulatory Visit: Payer: Self-pay | Admitting: *Deleted

## 2013-11-14 ENCOUNTER — Ambulatory Visit (INDEPENDENT_AMBULATORY_CARE_PROVIDER_SITE_OTHER): Payer: Medicare Other | Admitting: *Deleted

## 2013-11-14 DIAGNOSIS — I4891 Unspecified atrial fibrillation: Secondary | ICD-10-CM

## 2013-11-14 DIAGNOSIS — Z8673 Personal history of transient ischemic attack (TIA), and cerebral infarction without residual deficits: Secondary | ICD-10-CM

## 2013-11-14 DIAGNOSIS — Z5181 Encounter for therapeutic drug level monitoring: Secondary | ICD-10-CM

## 2013-11-14 DIAGNOSIS — Z7901 Long term (current) use of anticoagulants: Secondary | ICD-10-CM

## 2013-11-14 DIAGNOSIS — I480 Paroxysmal atrial fibrillation: Secondary | ICD-10-CM

## 2013-11-14 LAB — POCT INR: INR: 1.4

## 2013-11-21 ENCOUNTER — Ambulatory Visit (INDEPENDENT_AMBULATORY_CARE_PROVIDER_SITE_OTHER): Payer: Medicare Other | Admitting: *Deleted

## 2013-11-21 DIAGNOSIS — I4891 Unspecified atrial fibrillation: Secondary | ICD-10-CM

## 2013-11-21 DIAGNOSIS — I480 Paroxysmal atrial fibrillation: Secondary | ICD-10-CM

## 2013-11-21 DIAGNOSIS — Z7901 Long term (current) use of anticoagulants: Secondary | ICD-10-CM

## 2013-11-21 DIAGNOSIS — Z5181 Encounter for therapeutic drug level monitoring: Secondary | ICD-10-CM

## 2013-11-21 DIAGNOSIS — Z8673 Personal history of transient ischemic attack (TIA), and cerebral infarction without residual deficits: Secondary | ICD-10-CM

## 2013-11-21 LAB — POCT INR: INR: 1.8

## 2013-12-05 ENCOUNTER — Ambulatory Visit (INDEPENDENT_AMBULATORY_CARE_PROVIDER_SITE_OTHER): Payer: Medicare Other | Admitting: *Deleted

## 2013-12-05 DIAGNOSIS — I4891 Unspecified atrial fibrillation: Secondary | ICD-10-CM

## 2013-12-05 DIAGNOSIS — Z8673 Personal history of transient ischemic attack (TIA), and cerebral infarction without residual deficits: Secondary | ICD-10-CM

## 2013-12-05 DIAGNOSIS — I480 Paroxysmal atrial fibrillation: Secondary | ICD-10-CM

## 2013-12-05 DIAGNOSIS — Z7901 Long term (current) use of anticoagulants: Secondary | ICD-10-CM

## 2013-12-05 DIAGNOSIS — Z5181 Encounter for therapeutic drug level monitoring: Secondary | ICD-10-CM

## 2013-12-05 LAB — POCT INR: INR: 3.7

## 2013-12-05 MED ORDER — NITROGLYCERIN 0.4 MG SL SUBL: 0.4000 mg | SUBLINGUAL_TABLET | SUBLINGUAL | Status: DC | PRN

## 2013-12-08 ENCOUNTER — Encounter: Payer: Self-pay | Admitting: Vascular Surgery

## 2013-12-09 ENCOUNTER — Ambulatory Visit (INDEPENDENT_AMBULATORY_CARE_PROVIDER_SITE_OTHER): Payer: Medicare Other | Admitting: Vascular Surgery

## 2013-12-09 ENCOUNTER — Encounter: Payer: Self-pay | Admitting: Vascular Surgery

## 2013-12-09 ENCOUNTER — Ambulatory Visit (HOSPITAL_COMMUNITY)
Admission: RE | Admit: 2013-12-09 | Discharge: 2013-12-09 | Disposition: A | Discharge status: Home / Self Care | Payer: Medicare Other | Source: Ambulatory Visit | Attending: Vascular Surgery | Admitting: Vascular Surgery

## 2013-12-09 VITALS — BP 119/74 | HR 88 | Resp 16 | Ht 61.0 in | Wt 207.0 lb

## 2013-12-09 DIAGNOSIS — I70219 Atherosclerosis of native arteries of extremities with intermittent claudication, unspecified extremity: Secondary | ICD-10-CM

## 2013-12-09 NOTE — Progress Notes (Signed)
Patient name: Bianca White MRN: [id number] DOB: 1945/09/27 Sex: female   Referred by: Exie Parody  Reason for referral:  Chief Complaint  Patient presents with  . New Evaluation    bilateral leg pain  referred by DR Claretta Fraise    HISTORY OF PRESENT ILLNESS: Patient has today for evaluation of lower extremity discomfort. She is a 68 year old female who reports increasing severe lower extremity discomfort. She reports that she has discomfort from her hips down and feels that she is unstable which he first arises to stand. She also reports that with walking she feels that she needs to have her legs lifted. When getting into a car she actually feels as though she needs to have her legs lifted into the car. She has no history of tissue loss and no true Type claudication symptoms. She does have a history of coronary stenting in September of 2014 and is on chronic Plavix and warfarin  Past Medical History  Diagnosis Date  . Type 2 diabetes mellitus   . Essential hypertension, benign   . CVA (cerebral infarction)   . Paroxysmal atrial fibrillation     Detected on tele [facility] 3/14   . Hyperlipidemia   . Carotid artery disease     Nonobstructive; < 50% bilateral ICA stenosis, 07/2007.  Marland Kitchen Coronary atherosclerosis of native coronary artery     75% distal LAD, 75% diagonal, DES to mid RCA - March 2014    Past Surgical History  Procedure Laterality Date  . Cholecystectomy    . Bilateral carpal tunnel release    . Cervical neck fusion    . Hernia repair      History   Social History  . Marital Status: Widowed    Spouse Name: N/A    Number of Children: 1  . Years of Education: 9   Occupational History  . Not on file.   Social History Main Topics  . Smoking status: Never Smoker   . Smokeless tobacco: Never Used  . Alcohol Use: No  . Drug Use: No  . Sexual Activity: Not on file   Other Topics Concern  . Not on file   Social History Narrative   Patient is a widow and lives  alone.   Patient has one child.   Patient is retired.   Patient is right-handed.   Patient drinks one cup of coffee in the am, sometimes.   Patient has a high school education.    Family History  Problem Relation Age of Onset  . Stroke Father   . Stroke Mother     Allergies as of 12/09/2013  . (No Known Allergies)    Current Outpatient Prescriptions on File Prior to Visit  Medication Sig Dispense Refill  . allopurinol (ZYLOPRIM) 100 MG tablet Take 1 tablet by mouth daily as needed.      Marland Kitchen amLODipine (NORVASC) 5 MG tablet Take 5 mg by mouth daily.       Marland Kitchen atenolol (TENORMIN) 25 MG tablet Take 1 tablet (25 mg total) by mouth daily.  30 tablet  6  . benazepril (LOTENSIN) 20 MG tablet Take 20 mg by mouth daily.       . cholecalciferol (VITAMIN D) 1000 UNITS tablet Take 1,000 Units by mouth daily.      . clopidogrel (PLAVIX) 75 MG tablet Take 1 tablet (75 mg total) by mouth daily.  30 tablet  3  . Coenzyme Q10 (COQ-10) 200 MG CAPS Take 1 capsule by mouth  daily.      . fish oil-omega-3 fatty acids 1000 MG capsule Take 1 g by mouth daily.      . furosemide (LASIX) 40 MG tablet Take 1 tablet (40 mg total) by mouth every other day.  30 tablet  1  . indomethacin (INDOCIN) 50 MG capsule Take 50 mg by mouth 3 (three) times daily as needed.       . insulin detemir (LEVEMIR) 100 UNIT/ML injection Inject into the skin at bedtime.      . metFORMIN (GLUCOPHAGE) 500 MG tablet Take 500 mg by mouth 2 (two) times daily with a meal.       . nitroGLYCERIN (NITROSTAT) 0.4 MG SL tablet Place 1 tablet (0.4 mg total) under the tongue every 5 (five) minutes x 3 doses as needed for chest pain.  25 tablet  3  . NOVOFINE 32G X 6 MM MISC       . simvastatin (ZOCOR) 10 MG tablet Take 10 mg by mouth at bedtime.      Marland Kitchen warfarin (COUMADIN) 5 MG tablet TAKE ONE TABLET BY MOUTH IN THE EVENING  30 tablet  3   No current facility-administered medications on file prior to visit.     REVIEW OF SYSTEMS:  Positives  indicated with an "X"  CARDIOVASCULAR:  [ ]  chest pain   [ ]  chest pressure   [ ]  palpitations   [ ]  orthopnea   [ ]  dyspnea on exertion   [ ]  claudication   [ ]  rest pain   [ ]  DVT   [ ]  phlebitis PULMONARY:   [ ]  productive cough   [ ]  asthma   [ ]  wheezing NEUROLOGIC:   [x ] weakness  [x ] paresthesias  [ ]  aphasia  [ ]  amaurosis  [ ]  dizziness HEMATOLOGIC:   [ ]  bleeding problems   [ ]  clotting disorders MUSCULOSKELETAL:  [ ]  joint pain   [ ]  joint swelling GASTROINTESTINAL: [ ]   blood in stool  [ ]   hematemesis GENITOURINARY:  [ ]   dysuria  [ ]   hematuria PSYCHIATRIC:  [ ]  history of major depression INTEGUMENTARY:  [ ]  rashes  [ ]  ulcers CONSTITUTIONAL:  [ ]  fever   [ ]  chills  PHYSICAL EXAMINATION:  General: The patient is a well-nourished female, in no acute distress. Vital signs are BP 119/74  Pulse 88  Resp 16  Ht 5\' 1"  (1.549 m)  Wt 207 lb (93.895 kg)  BMI 39.13 kg/m2 Pulmonary: There is a good air exchange bilaterally without wheezing or rales. Abdomen: Soft and non-tender with normal pitch bowel sounds. Musculoskeletal: There are no major deformities.  There is no significant extremity pain. Neurologic: No focal weakness or paresthesias are detected, Skin: There are no ulcer or rashes noted. Psychiatric: The patient has normal affect. Cardiovascular: There is a regular rate and rhythm without significant murmur appreciated. Pulse status 2+ radial and 2+ dorsalis pedis pulses bilaterally 1-2+ pitting edema in both lower extremities  [facility] Vascular Lab Studies:  Ordered and Independently Reviewed totally normal ankle arm indices bilaterally and normal triphasic waveforms in both lower extremity  Impression and Plan:  Bilateral lower extremity discomfort which sounds to be more toward neurogenic then vascular in etiology. I discussed this at length with the patient and her husband present. I do not see any evidence of arterial insufficiency causing her symptoms. She was  reassured this discussion will see [place] again on an as-needed basis. She will continue to  followup with a potential spine issues. Apparently there had been some suggestion of injection in the past but this was now absent she had had a recent stent had to discontinue her Plavix.    Karle Desrosier [facility] Office: [phone]

## 2013-12-17 ENCOUNTER — Ambulatory Visit: Payer: Medicare Other | Admitting: Cardiology

## 2013-12-22 ENCOUNTER — Other Ambulatory Visit: Payer: Self-pay | Admitting: *Deleted

## 2013-12-22 ENCOUNTER — Encounter: Payer: Self-pay | Admitting: Cardiology

## 2013-12-22 MED ORDER — ATENOLOL 25 MG PO TABS
25.0000 mg | ORAL_TABLET | Freq: Every day | ORAL | Status: DC
Start: 2013-12-22 — End: 2013-12-26

## 2013-12-26 ENCOUNTER — Ambulatory Visit (INDEPENDENT_AMBULATORY_CARE_PROVIDER_SITE_OTHER): Payer: Medicare Other | Admitting: *Deleted

## 2013-12-26 DIAGNOSIS — Z5181 Encounter for therapeutic drug level monitoring: Secondary | ICD-10-CM

## 2013-12-26 DIAGNOSIS — I480 Paroxysmal atrial fibrillation: Secondary | ICD-10-CM

## 2013-12-26 DIAGNOSIS — Z8673 Personal history of transient ischemic attack (TIA), and cerebral infarction without residual deficits: Secondary | ICD-10-CM

## 2013-12-26 DIAGNOSIS — I4891 Unspecified atrial fibrillation: Secondary | ICD-10-CM

## 2013-12-26 DIAGNOSIS — Z7901 Long term (current) use of anticoagulants: Secondary | ICD-10-CM

## 2013-12-26 LAB — POCT INR: INR: 4.5

## 2014-01-05 ENCOUNTER — Ambulatory Visit (INDEPENDENT_AMBULATORY_CARE_PROVIDER_SITE_OTHER): Payer: Medicare Other | Admitting: *Deleted

## 2014-01-05 DIAGNOSIS — Z5181 Encounter for therapeutic drug level monitoring: Secondary | ICD-10-CM

## 2014-01-05 DIAGNOSIS — Z8673 Personal history of transient ischemic attack (TIA), and cerebral infarction without residual deficits: Secondary | ICD-10-CM

## 2014-01-05 DIAGNOSIS — Z7901 Long term (current) use of anticoagulants: Secondary | ICD-10-CM

## 2014-01-05 DIAGNOSIS — I4891 Unspecified atrial fibrillation: Secondary | ICD-10-CM

## 2014-01-05 DIAGNOSIS — I480 Paroxysmal atrial fibrillation: Secondary | ICD-10-CM

## 2014-01-05 LAB — POCT INR: INR: 2.9

## 2014-01-08 ENCOUNTER — Encounter: Payer: Self-pay | Admitting: Cardiology

## 2014-01-08 NOTE — Progress Notes (Signed)
Clinical Summary Bianca White is a 68 y.o.female last seen in February of this year. Review finds hospitalization at [facility] in April with reported episode of rapid atrial fibrillation and chest pain, resolved with rate control and return to sinus rhythm. She had a followup echocardiogram done at that time read by the [facility] practice reporting LVEF 60-65%, moderate biatrial enlargement, mild mitral and tricuspid regurgitation. Cardiac markers were negative during that hospital stay.  She is status post DES to the RCA in March 2014, continues on Plavix along with her Coumadin. We had discussed the possibility that she could come off Plavix after a year treatment following DES, although she tells me that she had been on this longer-term with prior diagnosis of stroke, still followed by Dr. Leonie Man. She has tolerated both Plavix and Coumadin, is hesitant to discontinue either one.   She is here with his son and daughter today. Tells me that she has been feeling somewhat better on atenolol 37.5 mg daily. Still tends to have palpitations in the evenings.  She reports followup with neurology pending in the next few weeks. I reminded her to ask them about whether she clearly need to stay on Plavix from a neurological perspective.  She also tells that she will be seeing Dr. Vertell Limber for consideration of possible spine injections for pain control. I did explain that she would need to be off Coumadin for this.   Allergies  Allergen Reactions  . Dilaudid [Hydromorphone Hcl] Other (See Comments)    Current Outpatient Prescriptions  Medication Sig Dispense Refill  . allopurinol (ZYLOPRIM) 100 MG tablet Take 1 tablet by mouth daily as needed.      Marland Kitchen amLODipine (NORVASC) 5 MG tablet Take 5 mg by mouth daily.       Marland Kitchen atenolol (TENORMIN) 25 MG tablet Take 1 tablet (25 mg total) by mouth 2 (two) times daily.  60 tablet  6  . benazepril (LOTENSIN) 20 MG tablet Take 20 mg by mouth daily.       .  cholecalciferol (VITAMIN D) 1000 UNITS tablet Take 1,000 Units by mouth daily.      . clopidogrel (PLAVIX) 75 MG tablet Take 1 tablet (75 mg total) by mouth daily.  30 tablet  3  . Coenzyme Q10 (COQ-10) 200 MG CAPS Take 1 capsule by mouth daily.      . fish oil-omega-3 fatty acids 1000 MG capsule Take 1 g by mouth daily.      . furosemide (LASIX) 40 MG tablet Take 1 tablet (40 mg total) by mouth every other day.  30 tablet  1  . indomethacin (INDOCIN) 50 MG capsule Take 50 mg by mouth 3 (three) times daily as needed.       . insulin detemir (LEVEMIR) 100 UNIT/ML injection Inject into the skin at bedtime.      . metFORMIN (GLUCOPHAGE) 500 MG tablet Take 500 mg by mouth 2 (two) times daily with a meal.       . nitroGLYCERIN (NITROSTAT) 0.4 MG SL tablet Place 1 tablet (0.4 mg total) under the tongue every 5 (five) minutes x 3 doses as needed for chest pain.  25 tablet  3  . NOVOFINE 32G X 6 MM MISC       . potassium chloride SA (K-DUR,KLOR-CON) 20 MEQ tablet Take 20 mEq by mouth daily.      . simvastatin (ZOCOR) 10 MG tablet Take 10 mg by mouth at bedtime.      Marland Kitchen warfarin (COUMADIN)  5 MG tablet TAKE ONE TABLET BY MOUTH IN THE EVENING  30 tablet  3   No current facility-administered medications for this visit.    Past Medical History  Diagnosis Date  . Type 2 diabetes mellitus   . Essential hypertension, benign   . CVA (cerebral infarction)   . Paroxysmal atrial fibrillation     Detected on tele [facility] 3/14   . Hyperlipidemia   . Carotid artery disease     Nonobstructive; < 50% bilateral ICA stenosis, 07/2007.  Marland Kitchen Coronary atherosclerosis of native coronary artery     75% distal LAD, 75% diagonal, DES to mid RCA - March 2014    Social History Ms. Dills reports that she has never smoked. She has never used smokeless tobacco. Ms. Haddad reports that she does not drink alcohol.  Review of Systems Have chronic back pain. No exertional chest pain. Intermittent palpitations as noted.  Denies any major bleeding problems. Otherwise negative.  Physical Examination Filed Vitals:   01/09/14 1603  BP: 119/79  Pulse: 61   Filed Weights   01/09/14 1603  Weight: 209 lb (94.802 kg)    Comfortable at rest.  HEENT: Conjunctiva and lids normal, oropharynx clear.  Neck: Supple, no elevated JVP or carotid bruits, no thyromegaly.  Lungs: Clear to auscultation, nonlabored breathing at rest.  Cardiac: Regular rate and rhythm with occasional ectopy, no S3 or significant systolic murmur, no pericardial rub.  Abdomen: Soft, nontender, bowel sounds present.  Extremities: 1+ edema, distal pulses 2+.  Skin: Warm and dry.  Musculoskeletal: No kyphosis.  Neuropsychiatric: Alert and oriented x3, affect grossly appropriate.   Problem List and Plan   Paroxysmal atrial fibrillation Try and increase atenolol to 25 mg twice daily, otherwise continue Coumadin. If she continues to manifest frequent breakthrough atrial fibrillation that is symptomatic, may need to consider antiarrhythmic therapy, possibly amiodarone. Will bring her back over the next month.  CAD (coronary artery disease), native coronary artery As noted above. She does have chest pain with atrial fibrillation, no ACS with recent hospitalization. Echocardiogram at that time showed normal LVEF.  Essential hypertension, benign Her blood pressure is normal today.  History of CVA (cerebrovascular accident) Reports pending neurology followup. She is on Coumadin with PAF, could come off of Plavix in terms of previous DES to the RCA in March 2014 unless there are clear neurological indications to continue the medicine. I have asked her to address this with neurology at her followup.  Hyperlipidemia Continues on Zocor.  Type 2 diabetes mellitus Followed by Dr. Marveen Reeks.    Satira Sark, M.D., F.A.C.C.

## 2014-01-09 ENCOUNTER — Encounter: Payer: Self-pay | Admitting: Cardiology

## 2014-01-09 ENCOUNTER — Ambulatory Visit (INDEPENDENT_AMBULATORY_CARE_PROVIDER_SITE_OTHER): Payer: Medicare Other | Admitting: Cardiology

## 2014-01-09 VITALS — BP 119/79 | HR 61 | Ht 61.0 in | Wt 209.0 lb

## 2014-01-09 DIAGNOSIS — I480 Paroxysmal atrial fibrillation: Secondary | ICD-10-CM

## 2014-01-09 DIAGNOSIS — E119 Type 2 diabetes mellitus without complications: Secondary | ICD-10-CM

## 2014-01-09 DIAGNOSIS — E785 Hyperlipidemia, unspecified: Secondary | ICD-10-CM

## 2014-01-09 DIAGNOSIS — I2510 Atherosclerotic heart disease of native coronary artery without angina pectoris: Secondary | ICD-10-CM

## 2014-01-09 DIAGNOSIS — I4891 Unspecified atrial fibrillation: Secondary | ICD-10-CM

## 2014-01-09 DIAGNOSIS — I70219 Atherosclerosis of native arteries of extremities with intermittent claudication, unspecified extremity: Secondary | ICD-10-CM

## 2014-01-09 DIAGNOSIS — Z8673 Personal history of transient ischemic attack (TIA), and cerebral infarction without residual deficits: Secondary | ICD-10-CM

## 2014-01-09 DIAGNOSIS — I10 Essential (primary) hypertension: Secondary | ICD-10-CM

## 2014-01-09 MED ORDER — ATENOLOL 25 MG PO TABS: 25.0000 mg | ORAL_TABLET | Freq: Two times a day (BID) | ORAL | Status: DC

## 2014-01-09 NOTE — Assessment & Plan Note (Signed)
As noted above. She does have chest pain with atrial fibrillation, no ACS with recent hospitalization. Echocardiogram at that time showed normal LVEF.

## 2014-01-09 NOTE — Assessment & Plan Note (Signed)
Her blood pressure is normal today.

## 2014-01-09 NOTE — Assessment & Plan Note (Signed)
Reports pending neurology followup. She is on Coumadin with PAF, could come off of Plavix in terms of previous DES to the RCA in March 2014 unless there are clear neurological indications to continue the medicine. I have asked her to address this with neurology at her followup.

## 2014-01-09 NOTE — Assessment & Plan Note (Signed)
Continues on Zocor.

## 2014-01-09 NOTE — Assessment & Plan Note (Signed)
Try and increase atenolol to 25 mg twice daily, otherwise continue Coumadin. If she continues to manifest frequent breakthrough atrial fibrillation that is symptomatic, may need to consider antiarrhythmic therapy, possibly amiodarone. Will bring her back over the next month.

## 2014-01-09 NOTE — Assessment & Plan Note (Signed)
Followed by Dr. Marveen Reeks.

## 2014-01-09 NOTE — Patient Instructions (Signed)
   Increase Atenolol to 25mg  TWICE A DAY  - new sent to pharm Continue all other medications.   Follow up in  1 month

## 2014-01-13 ENCOUNTER — Encounter: Payer: Self-pay | Admitting: Nurse Practitioner

## 2014-01-13 ENCOUNTER — Encounter (INDEPENDENT_AMBULATORY_CARE_PROVIDER_SITE_OTHER): Payer: Self-pay

## 2014-01-13 ENCOUNTER — Ambulatory Visit (INDEPENDENT_AMBULATORY_CARE_PROVIDER_SITE_OTHER): Payer: Medicare Other | Admitting: Nurse Practitioner

## 2014-01-13 VITALS — BP 119/72 | HR 63 | Ht 62.0 in | Wt 210.0 lb

## 2014-01-13 DIAGNOSIS — IMO0002 Reserved for concepts with insufficient information to code with codable children: Secondary | ICD-10-CM

## 2014-01-13 DIAGNOSIS — I63239 Cerebral infarction due to unspecified occlusion or stenosis of unspecified carotid arteries: Secondary | ICD-10-CM

## 2014-01-13 NOTE — Progress Notes (Signed)
[facility]  PATIENT: Bianca White DOB: 05-09-1946   REASON FOR VISIT: Followup for history of left middle cerebral artery infarct   HISTORY OF PRESENT ILLNESS:Bianca White, 68 year old female returns for follow up. 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 She was given two thirds does IV TPA and underwent clot extraction by Dr. Estanislado Pandy with complete recanalization.  She has had no new TIA or stroke symptoms. She denies double vision, loss of vision, focal weakness, numbness, speech or swallowing problems, confusions, blackouts. Last carotid Doppler 02/20/2012 was negative for stenosis.  She also has a long history of back pain but is unable to get steroid injections currently receiving physical therapy. She had a DES coronary stent in March 2014 and is currently on Coumadin and Plavix. There is a message from Dr. Domenic Polite, cardiologist to Dr. Leonie Man to see if patient can come off of the Plavix and remain on Coumadin. The patient has apparently been reluctant to do that until visit today. She returns for reevaluation.    REVIEW OF SYSTEMS: Full 14 system review of systems performed and notable only for those listed, all others are neg:  Constitutional: N/A  Cardiovascular: N/A  Ear/Nose/Throat: N/A  Skin: N/A  Eyes: N/A  Respiratory: N/A  Gastroitestinal: N/A  Hematology/Lymphatic: N/A  Endocrine: N/A Musculoskeletal: Joint pain, aching muscles, walking difficulty Allergy/Immunology: N/A  Neurological: N/A Psychiatric: N/A Sleep : NA   ALLERGIES: Allergies  Allergen Reactions  . Dilaudid [Hydromorphone Hcl] Other (See Comments)    HOME MEDICATIONS: Outpatient Prescriptions Prior to Visit  Medication Sig Dispense Refill  . allopurinol (ZYLOPRIM) 100 MG tablet Take 1 tablet by mouth daily as needed.      Marland Kitchen amLODipine (NORVASC) 5 MG tablet Take 5 mg by mouth  daily.       Marland Kitchen atenolol (TENORMIN) 25 MG tablet Take 1 tablet (25 mg total) by mouth 2 (two) times daily.  60 tablet  6  . benazepril (LOTENSIN) 20 MG tablet Take 20 mg by mouth daily.       . cholecalciferol (VITAMIN D) 1000 UNITS tablet Take 1,000 Units by mouth daily.      . clopidogrel (PLAVIX) 75 MG tablet Take 1 tablet (75 mg total) by mouth daily.  30 tablet  3  . Coenzyme Q10 (COQ-10) 200 MG CAPS Take 1 capsule by mouth daily.      . fish oil-omega-3 fatty acids 1000 MG capsule Take 1 g by mouth daily.      . furosemide (LASIX) 40 MG tablet Take 1 tablet (40 mg total) by mouth every other day.  30 tablet  1  . indomethacin (INDOCIN) 50 MG capsule Take 50 mg by mouth 3 (three) times daily as needed.       . insulin detemir (LEVEMIR) 100 UNIT/ML injection Inject into the skin at bedtime.      . metFORMIN (GLUCOPHAGE) 500 MG tablet Take 500 mg by mouth 2 (two) times daily with a meal.       . nitroGLYCERIN (NITROSTAT) 0.4 MG SL tablet Place 1 tablet (0.4 mg total) under the tongue every 5 (five) minutes x 3 doses as needed for chest pain.  25 tablet  3  . NOVOFINE 32G X 6 MM MISC       . potassium chloride SA (K-DUR,KLOR-CON) 20 MEQ tablet Take 20 mEq by mouth daily.      Marland Kitchen  simvastatin (ZOCOR) 10 MG tablet Take 10 mg by mouth at bedtime.      Marland Kitchen warfarin (COUMADIN) 5 MG tablet TAKE ONE TABLET BY MOUTH IN THE EVENING  30 tablet  3   No facility-administered medications prior to visit.    PAST MEDICAL HISTORY: Past Medical History  Diagnosis Date  . Type 2 diabetes mellitus   . Essential hypertension, benign   . CVA (cerebral infarction)   . Paroxysmal atrial fibrillation     Detected on tele [facility] 3/14   . Hyperlipidemia   . Carotid artery disease     Nonobstructive; < 50% bilateral ICA stenosis, 07/2007.  Marland Kitchen Coronary atherosclerosis of native coronary artery     75% distal LAD, 75% diagonal, DES to mid RCA - March 2014    PAST SURGICAL HISTORY: Past Surgical History  Procedure  Laterality Date  . Cholecystectomy    . Bilateral carpal tunnel release    . Cervical neck fusion    . Hernia repair      FAMILY HISTORY: Family History  Problem Relation Age of Onset  . Stroke Father   . Stroke Mother     SOCIAL HISTORY: History   Social History  . Marital Status: Widowed    Spouse Name: N/A    Number of Children: 1  . Years of Education: 9   Occupational History  . Retired    Social History Main Topics  . Smoking status: Never Smoker   . Smokeless tobacco: Never Used  . Alcohol Use: No  . Drug Use: No  . Sexual Activity: Not on file   Other Topics Concern  . Not on file   Social History Narrative   Patient is a widow and lives alone.   Patient has one child.   Patient is retired.   Patient is right-handed.   Patient drinks one cup of coffee in the am, sometimes.   Patient has a high school education.     PHYSICAL EXAM  Filed Vitals:   01/13/14 1350  BP: 119/72  Pulse: 63  Height: 5\' 2"  (1.575 m)  Weight: 210 lb (95.255 kg)   Body mass index is 38.4 kg/(m^2).  Generalized: Well developed, in no acute distress  Head: normocephalic and atraumatic,. Oropharynx benign  Neck: Supple, no carotid bruits  Cardiac: Regular rate rhythm, no murmur  Musculoskeletal: No deformity   Neurological examination   Mentation: Alert oriented to time, place, history taking. Follows all commands speech and language fluent  Cranial nerve II-XII: Pupils were equal round reactive to light extraocular movements were full, visual field were full on confrontational test. Facial sensation and strength were normal. hearing was intact to finger rubbing bilaterally. Uvula tongue midline. head turning and shoulder shrug were normal and symmetric.Tongue protrusion into cheek strength was normal. Motor: normal bulk and tone, full strength in the BUE, BLE, fine finger movements normal, no pronator drift. No focal weakness Sensory: normal and symmetric to light touch,  pinprick, and  vibration  Coordination: finger-nose-finger, heel-to-shin bilaterally, no dysmetria Reflexes: 1+ upper lower and symmetric,  plantar responses were flexor bilaterally. Gait and Station: Rising up from seated position without assistance, normal stance,  moderate stride, good arm swing, smooth turning, Tandem gait is mildly unsteady. No assistive device  DIAGNOSTIC DATA (LABS, IMAGING, TESTING) -  ASSESSMENT AND PLAN  68 y.o. year old female  has a past medical history of Type 2 diabetes mellitus; Essential hypertension, benign; CVA (cerebral infarction); Paroxysmal atrial fibrillation; Hyperlipidemia; Carotid artery  disease; and Coronary atherosclerosis of native coronary artery. here to follow up.  Discussed with Dr. Leonie Man, may stop Plavix and continue Coumadin Blood pressure in excellent control Followup in 6 months Dennie Bible, [facility], [facility], [facility] [address] [place], [place] [address] [phone]

## 2014-01-13 NOTE — Patient Instructions (Signed)
Discussed with Dr. Leonie Man, may stop Plavix and continue Coumadin Blood pressure in excellent control Followup in 6 months

## 2014-01-27 ENCOUNTER — Ambulatory Visit (INDEPENDENT_AMBULATORY_CARE_PROVIDER_SITE_OTHER): Payer: Medicare Other | Admitting: *Deleted

## 2014-01-27 DIAGNOSIS — I4891 Unspecified atrial fibrillation: Secondary | ICD-10-CM

## 2014-01-27 DIAGNOSIS — Z5181 Encounter for therapeutic drug level monitoring: Secondary | ICD-10-CM

## 2014-01-27 DIAGNOSIS — I480 Paroxysmal atrial fibrillation: Secondary | ICD-10-CM

## 2014-01-27 DIAGNOSIS — Z8673 Personal history of transient ischemic attack (TIA), and cerebral infarction without residual deficits: Secondary | ICD-10-CM

## 2014-01-27 DIAGNOSIS — Z7901 Long term (current) use of anticoagulants: Secondary | ICD-10-CM

## 2014-01-27 LAB — POCT INR: INR: 3.8

## 2014-01-30 ENCOUNTER — Other Ambulatory Visit: Payer: Self-pay | Admitting: *Deleted

## 2014-01-30 MED ORDER — WARFARIN SODIUM 5 MG PO TABS: ORAL_TABLET | ORAL | Status: DC

## 2014-02-09 ENCOUNTER — Encounter: Payer: Self-pay | Admitting: Cardiology

## 2014-02-09 ENCOUNTER — Ambulatory Visit (INDEPENDENT_AMBULATORY_CARE_PROVIDER_SITE_OTHER): Payer: Medicare Other | Admitting: Cardiology

## 2014-02-09 VITALS — BP 102/63 | HR 66 | Ht 61.0 in | Wt 209.8 lb

## 2014-02-09 DIAGNOSIS — I4891 Unspecified atrial fibrillation: Secondary | ICD-10-CM

## 2014-02-09 DIAGNOSIS — I2510 Atherosclerotic heart disease of native coronary artery without angina pectoris: Secondary | ICD-10-CM

## 2014-02-09 DIAGNOSIS — I63239 Cerebral infarction due to unspecified occlusion or stenosis of unspecified carotid arteries: Secondary | ICD-10-CM

## 2014-02-09 DIAGNOSIS — I480 Paroxysmal atrial fibrillation: Secondary | ICD-10-CM

## 2014-02-09 DIAGNOSIS — I10 Essential (primary) hypertension: Secondary | ICD-10-CM

## 2014-02-09 NOTE — Assessment & Plan Note (Signed)
Continue current regimen, she has tolerated the increase in atenolol. Keep Coumadin followup for the Coumadin clinic.

## 2014-02-09 NOTE — Assessment & Plan Note (Signed)
Blood pressure low normal today. No change made to current regimen.

## 2014-02-09 NOTE — Progress Notes (Signed)
Clinical Summary Bianca White is a 68 y.o.female last seen in May. At that time atenolol was increased to 25 mg twice daily. She has been better with fewer palpitations, typically very brief. No chest pain symptoms. Her weight is stable.  Interval followup with Neurology noted, it was agreed that the patient could stop Plavix at that time and continue on Coumadin.  Recent echocardiogram done at [facility] in April revealed LVEF 60-65%, moderate biatrial enlargement, mild mitral and tricuspid regurgitation.  She continues to undergo evaluation for back pain, no definitive plans for spine surgery at this point. She reports followup pending for later this week.  She is being treated with Cipro for a recent flare of diverticulitis. She is due for followup INR check soon. No reported bleeding problems.   Allergies  Allergen Reactions  . Dilaudid [Hydromorphone Hcl] Other (See Comments)    Current Outpatient Prescriptions  Medication Sig Dispense Refill  . allopurinol (ZYLOPRIM) 100 MG tablet Take 1 tablet by mouth daily as needed.      Marland Kitchen amLODipine (NORVASC) 5 MG tablet Take 5 mg by mouth daily.       Marland Kitchen atenolol (TENORMIN) 25 MG tablet Take 1 tablet (25 mg total) by mouth 2 (two) times daily.  60 tablet  6  . benazepril (LOTENSIN) 20 MG tablet Take 20 mg by mouth daily.       . cholecalciferol (VITAMIN D) 1000 UNITS tablet Take 1,000 Units by mouth daily.      . ciprofloxacin (CIPRO) 250 MG tablet Take 250 mg by mouth 2 (two) times daily.      . Coenzyme Q10 (COQ-10) 200 MG CAPS Take 1 capsule by mouth daily.      . febuxostat (ULORIC) 40 MG tablet Take 40 mg by mouth daily.      . fish oil-omega-3 fatty acids 1000 MG capsule Take 1 g by mouth daily.      . furosemide (LASIX) 40 MG tablet Take 1 tablet (40 mg total) by mouth every other day.  30 tablet  1  . nitroGLYCERIN (NITROSTAT) 0.4 MG SL tablet Place 1 tablet (0.4 mg total) under the tongue every 5 (five) minutes x 3 doses as needed  for chest pain.  25 tablet  3  . potassium chloride SA (K-DUR,KLOR-CON) 20 MEQ tablet Take 20 mEq by mouth daily.      . simvastatin (ZOCOR) 10 MG tablet Take 10 mg by mouth at bedtime.      . sitaGLIPtin-metformin (JANUMET) 50-500 MG per tablet Take 1 tablet by mouth 2 (two) times daily with a meal.      . spironolactone (ALDACTONE) 25 MG tablet Take 25 mg by mouth daily at 12 noon. Noon      . warfarin (COUMADIN) 5 MG tablet TAKE ONE TABLET BY MOUTH IN THE EVENING  30 tablet  3   No current facility-administered medications for this visit.    Past Medical History  Diagnosis Date  . Type 2 diabetes mellitus   . Essential hypertension, benign   . CVA (cerebral infarction)   . Paroxysmal atrial fibrillation     Detected on tele [facility] 3/14   . Hyperlipidemia   . Carotid artery disease     Nonobstructive; < 50% bilateral ICA stenosis, 07/2007.  Marland Kitchen Coronary atherosclerosis of native coronary artery     75% distal LAD, 75% diagonal, DES to mid RCA - March 2014    Social History Ms. Rancourt reports that she has never smoked.  She has never used smokeless tobacco. Ms. Delawder reports that she does not drink alcohol.  Review of Systems Negative except as outlined above.  Physical Examination Filed Vitals:   02/09/14 1454  BP: 102/63  Pulse: 66   Filed Weights   02/09/14 1454  Weight: 209 lb 12.8 oz (95.165 kg)    No distress. HEENT: Conjunctiva and lids normal, oropharynx clear.  Neck: Supple, no elevated JVP or carotid bruits, no thyromegaly.  Lungs: Clear to auscultation, nonlabored breathing at rest.  Cardiac: Regular rate and rhythm with occasional ectopy, no S3 or significant systolic murmur, no pericardial rub.  Abdomen: Soft, nontender, bowel sounds present.  Extremities: 1+ edema, distal pulses 2+.    Problem List and Plan   Paroxysmal atrial fibrillation Continue current regimen, she has tolerated the increase in atenolol. Keep Coumadin followup for the Coumadin  clinic.  CAD (coronary artery disease), native coronary artery No active angina symptoms with known distal LAD and diagonal disease being managed medically, prior DES to the RCA in March 2014.  Essential hypertension, benign Blood pressure low normal today. No change made to current regimen.    Satira Sark, M.D., F.A.C.C.

## 2014-02-09 NOTE — Patient Instructions (Addendum)
Your physician recommends that you schedule a follow-up appointment in: 3 months. Your physician recommends that you continue on your current medications as directed. Please refer to the Current Medication list given to you today. Per Lattie Haw, your coumadin nurse, eat some extra greens while on cipro and keep your scheduled visit with our coumadin clinic.

## 2014-02-09 NOTE — Assessment & Plan Note (Signed)
No active angina symptoms with known distal LAD and diagonal disease being managed medically, prior DES to the RCA in March 2014.

## 2014-02-16 ENCOUNTER — Ambulatory Visit (INDEPENDENT_AMBULATORY_CARE_PROVIDER_SITE_OTHER): Payer: Medicare Other | Admitting: *Deleted

## 2014-02-16 DIAGNOSIS — Z5181 Encounter for therapeutic drug level monitoring: Secondary | ICD-10-CM

## 2014-02-16 DIAGNOSIS — I480 Paroxysmal atrial fibrillation: Secondary | ICD-10-CM

## 2014-02-16 DIAGNOSIS — I4891 Unspecified atrial fibrillation: Secondary | ICD-10-CM

## 2014-02-16 DIAGNOSIS — Z8673 Personal history of transient ischemic attack (TIA), and cerebral infarction without residual deficits: Secondary | ICD-10-CM

## 2014-02-16 DIAGNOSIS — Z7901 Long term (current) use of anticoagulants: Secondary | ICD-10-CM

## 2014-02-16 LAB — POCT INR: INR: 1.3

## 2014-02-27 ENCOUNTER — Ambulatory Visit (INDEPENDENT_AMBULATORY_CARE_PROVIDER_SITE_OTHER): Payer: Medicare Other | Admitting: *Deleted

## 2014-02-27 DIAGNOSIS — Z7901 Long term (current) use of anticoagulants: Secondary | ICD-10-CM

## 2014-02-27 DIAGNOSIS — Z8673 Personal history of transient ischemic attack (TIA), and cerebral infarction without residual deficits: Secondary | ICD-10-CM

## 2014-02-27 DIAGNOSIS — I480 Paroxysmal atrial fibrillation: Secondary | ICD-10-CM

## 2014-02-27 DIAGNOSIS — I4891 Unspecified atrial fibrillation: Secondary | ICD-10-CM

## 2014-02-27 DIAGNOSIS — Z5181 Encounter for therapeutic drug level monitoring: Secondary | ICD-10-CM

## 2014-02-27 LAB — POCT INR: INR: 1.6

## 2014-03-10 ENCOUNTER — Ambulatory Visit (INDEPENDENT_AMBULATORY_CARE_PROVIDER_SITE_OTHER): Payer: Medicare Other | Admitting: *Deleted

## 2014-03-10 DIAGNOSIS — I480 Paroxysmal atrial fibrillation: Secondary | ICD-10-CM

## 2014-03-10 DIAGNOSIS — Z5181 Encounter for therapeutic drug level monitoring: Secondary | ICD-10-CM

## 2014-03-10 DIAGNOSIS — Z8673 Personal history of transient ischemic attack (TIA), and cerebral infarction without residual deficits: Secondary | ICD-10-CM

## 2014-03-10 DIAGNOSIS — Z7901 Long term (current) use of anticoagulants: Secondary | ICD-10-CM

## 2014-03-10 DIAGNOSIS — I4891 Unspecified atrial fibrillation: Secondary | ICD-10-CM

## 2014-03-10 LAB — POCT INR: INR: 1.2

## 2014-03-24 ENCOUNTER — Ambulatory Visit (INDEPENDENT_AMBULATORY_CARE_PROVIDER_SITE_OTHER): Payer: Medicare Other | Admitting: *Deleted

## 2014-03-24 DIAGNOSIS — Z5181 Encounter for therapeutic drug level monitoring: Secondary | ICD-10-CM

## 2014-03-24 DIAGNOSIS — I480 Paroxysmal atrial fibrillation: Secondary | ICD-10-CM

## 2014-03-24 DIAGNOSIS — I4891 Unspecified atrial fibrillation: Secondary | ICD-10-CM

## 2014-03-24 DIAGNOSIS — Z7901 Long term (current) use of anticoagulants: Secondary | ICD-10-CM

## 2014-03-24 DIAGNOSIS — Z8673 Personal history of transient ischemic attack (TIA), and cerebral infarction without residual deficits: Secondary | ICD-10-CM

## 2014-03-24 LAB — POCT INR: INR: 1.7

## 2014-03-24 MED ORDER — WARFARIN SODIUM 5 MG PO TABS: ORAL_TABLET | ORAL | Status: DC

## 2014-04-07 ENCOUNTER — Ambulatory Visit (INDEPENDENT_AMBULATORY_CARE_PROVIDER_SITE_OTHER): Payer: Medicare Other | Admitting: *Deleted

## 2014-04-07 DIAGNOSIS — Z7901 Long term (current) use of anticoagulants: Secondary | ICD-10-CM

## 2014-04-07 DIAGNOSIS — Z8673 Personal history of transient ischemic attack (TIA), and cerebral infarction without residual deficits: Secondary | ICD-10-CM

## 2014-04-07 DIAGNOSIS — I4891 Unspecified atrial fibrillation: Secondary | ICD-10-CM

## 2014-04-07 DIAGNOSIS — Z5181 Encounter for therapeutic drug level monitoring: Secondary | ICD-10-CM

## 2014-04-07 DIAGNOSIS — I480 Paroxysmal atrial fibrillation: Secondary | ICD-10-CM

## 2014-04-07 LAB — POCT INR: INR: 2.5

## 2014-04-21 ENCOUNTER — Ambulatory Visit (INDEPENDENT_AMBULATORY_CARE_PROVIDER_SITE_OTHER): Payer: Medicare Other | Admitting: *Deleted

## 2014-04-21 DIAGNOSIS — I480 Paroxysmal atrial fibrillation: Secondary | ICD-10-CM

## 2014-04-21 DIAGNOSIS — Z7901 Long term (current) use of anticoagulants: Secondary | ICD-10-CM

## 2014-04-21 DIAGNOSIS — Z5181 Encounter for therapeutic drug level monitoring: Secondary | ICD-10-CM

## 2014-04-21 DIAGNOSIS — I4891 Unspecified atrial fibrillation: Secondary | ICD-10-CM

## 2014-04-21 DIAGNOSIS — Z8673 Personal history of transient ischemic attack (TIA), and cerebral infarction without residual deficits: Secondary | ICD-10-CM

## 2014-04-21 LAB — POCT INR: INR: 1.2

## 2014-05-01 ENCOUNTER — Ambulatory Visit (INDEPENDENT_AMBULATORY_CARE_PROVIDER_SITE_OTHER): Payer: Medicare Other | Admitting: *Deleted

## 2014-05-01 DIAGNOSIS — Z5181 Encounter for therapeutic drug level monitoring: Secondary | ICD-10-CM

## 2014-05-01 DIAGNOSIS — I4891 Unspecified atrial fibrillation: Secondary | ICD-10-CM

## 2014-05-01 DIAGNOSIS — I480 Paroxysmal atrial fibrillation: Secondary | ICD-10-CM

## 2014-05-01 DIAGNOSIS — Z8673 Personal history of transient ischemic attack (TIA), and cerebral infarction without residual deficits: Secondary | ICD-10-CM

## 2014-05-01 DIAGNOSIS — Z7901 Long term (current) use of anticoagulants: Secondary | ICD-10-CM

## 2014-05-01 LAB — POCT INR: INR: 3.5

## 2014-05-12 ENCOUNTER — Telehealth: Payer: Self-pay | Admitting: *Deleted

## 2014-05-12 ENCOUNTER — Ambulatory Visit (INDEPENDENT_AMBULATORY_CARE_PROVIDER_SITE_OTHER): Payer: Medicare Other | Admitting: Cardiology

## 2014-05-12 ENCOUNTER — Encounter: Payer: Self-pay | Admitting: Cardiology

## 2014-05-12 VITALS — BP 130/86 | HR 64 | Ht 61.0 in | Wt 207.0 lb

## 2014-05-12 DIAGNOSIS — I63239 Cerebral infarction due to unspecified occlusion or stenosis of unspecified carotid arteries: Secondary | ICD-10-CM

## 2014-05-12 DIAGNOSIS — I480 Paroxysmal atrial fibrillation: Secondary | ICD-10-CM

## 2014-05-12 DIAGNOSIS — I10 Essential (primary) hypertension: Secondary | ICD-10-CM

## 2014-05-12 DIAGNOSIS — I2510 Atherosclerotic heart disease of native coronary artery without angina pectoris: Secondary | ICD-10-CM

## 2014-05-12 DIAGNOSIS — I4891 Unspecified atrial fibrillation: Secondary | ICD-10-CM

## 2014-05-12 NOTE — Assessment & Plan Note (Signed)
No change in current antihypertensive regimen.

## 2014-05-12 NOTE — Assessment & Plan Note (Signed)
Continue current regimen, maintain followup in the Coumadin clinic.

## 2014-05-12 NOTE — Telephone Encounter (Signed)
Spoke with pt and will call her later at home with updated instructions

## 2014-05-12 NOTE — Progress Notes (Signed)
Clinical Summary Bianca White is a 68 y.o.female last seen in June. She reports vague sense of intermittent, brief palpitations, nothing progressive. No chest pain symptoms.  We continue to manage her medically with history of CAD, no progressive angina symptoms.  Recent echocardiogram done at [facility] in April revealed LVEF 60-65%, moderate biatrial enlargement, mild mitral and tricuspid regurgitation.  She is followed in the Coumadin clinic. She plans to have a pain management spine injection on Friday, has been off Coumadin in anticipation of this procedure.   Allergies  Allergen Reactions  . Dilaudid [Hydromorphone Hcl] Other (See Comments)    Current Outpatient Prescriptions  Medication Sig Dispense Refill  . amLODipine (NORVASC) 5 MG tablet Take 5 mg by mouth daily.       Marland Kitchen atenolol (TENORMIN) 25 MG tablet Take 1 tablet (25 mg total) by mouth 2 (two) times daily.  60 tablet  6  . benazepril (LOTENSIN) 20 MG tablet Take 20 mg by mouth daily.       . cholecalciferol (VITAMIN D) 1000 UNITS tablet Take 1,000 Units by mouth daily.      . febuxostat (ULORIC) 40 MG tablet Take 40 mg by mouth daily.      . fish oil-omega-3 fatty acids 1000 MG capsule Take 1 g by mouth daily.      . furosemide (LASIX) 40 MG tablet Take 1 tablet (40 mg total) by mouth every other day.  30 tablet  1  . nitroGLYCERIN (NITROSTAT) 0.4 MG SL tablet Place 1 tablet (0.4 mg total) under the tongue every 5 (five) minutes x 3 doses as needed for chest pain.  25 tablet  3  . simvastatin (ZOCOR) 10 MG tablet Take 10 mg by mouth at bedtime.      . sitaGLIPtin-metformin (JANUMET) 50-500 MG per tablet Take 1 tablet by mouth 2 (two) times daily with a meal.      . warfarin (COUMADIN) 5 MG tablet Take 1 tablet daily except 1 1/2 tablet on Tuesdays, Thursdays and Saturdays or as directed  45 tablet  6  . spironolactone (ALDACTONE) 25 MG tablet Take 25 mg by mouth daily at 12 noon. Noon       No current  facility-administered medications for this visit.    Past Medical History  Diagnosis Date  . Type 2 diabetes mellitus   . Essential hypertension, benign   . CVA (cerebral infarction)   . Paroxysmal atrial fibrillation     Detected on tele [facility] 3/14   . Hyperlipidemia   . Carotid artery disease     Nonobstructive; < 50% bilateral ICA stenosis, 07/2007.  Marland Kitchen Coronary atherosclerosis of native coronary artery     75% distal LAD, 75% diagonal, DES to mid RCA - March 2014    Social History Bianca White reports that she has never smoked. She has never used smokeless tobacco. Bianca White reports that she does not drink alcohol.  Review of Systems Other systems reviewed and negative.  Physical Examination Filed Vitals:   05/12/14 1055  BP: 130/86  Pulse: 64   Filed Weights   05/12/14 1055  Weight: 207 lb (93.895 kg)   Appears comfortable at rest. HEENT: Conjunctiva and lids normal, oropharynx clear.  Neck: Supple, no elevated JVP or carotid bruits, no thyromegaly.  Lungs: Clear to auscultation, nonlabored breathing at rest.  Cardiac: Regular rate and rhythm with occasional ectopy, no S3 or significant systolic murmur, no pericardial rub.  Abdomen: Soft, nontender, bowel sounds present.  Extremities:  1+ edema, distal pulses 2+.    Problem List and Plan   Paroxysmal atrial fibrillation Continue current regimen, maintain followup in the Coumadin clinic.  CAD (coronary artery disease), native coronary artery No active angina symptoms on medical therapy.  Essential hypertension, benign No change in current antihypertensive regimen.    Satira Sark, M.D., F.A.C.C.

## 2014-05-12 NOTE — Assessment & Plan Note (Signed)
No active angina symptoms on medical therapy.

## 2014-05-12 NOTE — Patient Instructions (Addendum)

## 2014-05-12 NOTE — Telephone Encounter (Signed)
Pt saw Dr Domenic Polite today and she informed him that she had been holding coumadin for 5 days for Spinal injection on Friday Sept 4th. This nurse called pt and instructed her that after her procedure when instructed by MD doing procedure to restart her coumadin to restart  at same dose  Coumadin 5mg  daily except 7.5mg  on Tuesdays Thursdays and Saturdays and to keep her appt at coumadin clinic on Sept 11th 1 week after procedure and she states understanding.

## 2014-05-22 ENCOUNTER — Ambulatory Visit (INDEPENDENT_AMBULATORY_CARE_PROVIDER_SITE_OTHER): Payer: Medicare Other | Admitting: *Deleted

## 2014-05-22 DIAGNOSIS — I480 Paroxysmal atrial fibrillation: Secondary | ICD-10-CM

## 2014-05-22 DIAGNOSIS — I4891 Unspecified atrial fibrillation: Secondary | ICD-10-CM

## 2014-05-22 DIAGNOSIS — Z7901 Long term (current) use of anticoagulants: Secondary | ICD-10-CM

## 2014-05-22 DIAGNOSIS — Z5181 Encounter for therapeutic drug level monitoring: Secondary | ICD-10-CM

## 2014-05-22 DIAGNOSIS — Z8673 Personal history of transient ischemic attack (TIA), and cerebral infarction without residual deficits: Secondary | ICD-10-CM

## 2014-05-22 LAB — POCT INR: INR: 1.8

## 2014-06-05 ENCOUNTER — Ambulatory Visit (INDEPENDENT_AMBULATORY_CARE_PROVIDER_SITE_OTHER): Payer: Medicare Other | Admitting: *Deleted

## 2014-06-05 DIAGNOSIS — Z8673 Personal history of transient ischemic attack (TIA), and cerebral infarction without residual deficits: Secondary | ICD-10-CM

## 2014-06-05 DIAGNOSIS — Z7901 Long term (current) use of anticoagulants: Secondary | ICD-10-CM

## 2014-06-05 DIAGNOSIS — I480 Paroxysmal atrial fibrillation: Secondary | ICD-10-CM

## 2014-06-05 DIAGNOSIS — Z5181 Encounter for therapeutic drug level monitoring: Secondary | ICD-10-CM

## 2014-06-05 DIAGNOSIS — I4891 Unspecified atrial fibrillation: Secondary | ICD-10-CM

## 2014-06-05 LAB — POCT INR: INR: 4

## 2014-06-19 ENCOUNTER — Ambulatory Visit (INDEPENDENT_AMBULATORY_CARE_PROVIDER_SITE_OTHER): Payer: Medicare Other

## 2014-06-19 DIAGNOSIS — Z7901 Long term (current) use of anticoagulants: Secondary | ICD-10-CM

## 2014-06-19 DIAGNOSIS — Z8673 Personal history of transient ischemic attack (TIA), and cerebral infarction without residual deficits: Secondary | ICD-10-CM

## 2014-06-19 DIAGNOSIS — Z5181 Encounter for therapeutic drug level monitoring: Secondary | ICD-10-CM

## 2014-06-19 DIAGNOSIS — I480 Paroxysmal atrial fibrillation: Secondary | ICD-10-CM

## 2014-06-19 LAB — POCT INR: INR: 3.9

## 2014-06-24 ENCOUNTER — Other Ambulatory Visit: Payer: Self-pay

## 2014-06-24 MED ORDER — FUROSEMIDE 40 MG PO TABS: 40.0000 mg | ORAL_TABLET | ORAL | Status: DC

## 2014-07-02 ENCOUNTER — Ambulatory Visit (INDEPENDENT_AMBULATORY_CARE_PROVIDER_SITE_OTHER): Payer: Medicare Other | Admitting: *Deleted

## 2014-07-02 DIAGNOSIS — Z7901 Long term (current) use of anticoagulants: Secondary | ICD-10-CM

## 2014-07-02 DIAGNOSIS — Z5181 Encounter for therapeutic drug level monitoring: Secondary | ICD-10-CM

## 2014-07-02 DIAGNOSIS — Z8673 Personal history of transient ischemic attack (TIA), and cerebral infarction without residual deficits: Secondary | ICD-10-CM

## 2014-07-02 DIAGNOSIS — I480 Paroxysmal atrial fibrillation: Secondary | ICD-10-CM

## 2014-07-02 LAB — POCT INR: INR: 2.1

## 2014-07-14 ENCOUNTER — Ambulatory Visit (INDEPENDENT_AMBULATORY_CARE_PROVIDER_SITE_OTHER): Payer: Medicare Other | Admitting: Nurse Practitioner

## 2014-07-14 ENCOUNTER — Encounter: Payer: Self-pay | Admitting: Nurse Practitioner

## 2014-07-14 VITALS — BP 121/69 | HR 53 | Ht 62.0 in | Wt 212.2 lb

## 2014-07-14 DIAGNOSIS — I639 Cerebral infarction, unspecified: Secondary | ICD-10-CM

## 2014-07-14 DIAGNOSIS — I63239 Cerebral infarction due to unspecified occlusion or stenosis of unspecified carotid arteries: Secondary | ICD-10-CM

## 2014-07-14 NOTE — Patient Instructions (Addendum)
Continue Coumadin for secondary stroke prevention B/P in good control (121/69) Continue with cane for safety with ambulation F/U yearly and prn

## 2014-07-14 NOTE — Progress Notes (Signed)
[facility]  PATIENT: Bianca White DOB: [birth year]   REASON FOR VISIT: follow up for stroke   HISTORY OF PRESENT ILLNESS:Bianca White, 68 year old female returns for follow up. 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 She was given two thirds does IV TPA and underwent clot extraction by Dr. Estanislado Pandy with complete recanalization. She has had no new TIA or stroke symptoms. She denies double vision, loss of vision, focal weakness, numbness, speech or swallowing problems, confusions, blackouts. Last carotid Doppler 02/20/2012 was negative for stenosis. She also has a long history of back pain but is unable to get steroid injections. She had a DES coronary stent in March 2014 and is currently on Coumadin . She returns for reevaluation.   REVIEW OF SYSTEMS: Full 14 system review of systems performed and notable only for those listed, all others are neg:  Constitutional: N/A  Cardiovascular: leg swelling Ear/Nose/Throat: N/A  Skin: N/A  Eyes: N/A  Respiratory: N/A  Gastroitestinal: N/A  Hematology/Lymphatic: N/A  Endocrine: N/A Musculoskeletal:back pain, walking difficulty Allergy/Immunology: N/A  Neurological: N/A Psychiatric: N/A Sleep : NA   ALLERGIES: Allergies  Allergen Reactions  . Dilaudid [Hydromorphone Hcl] Other (See Comments)    HOME MEDICATIONS: Outpatient Prescriptions Prior to Visit  Medication Sig Dispense Refill  . amLODipine (NORVASC) 5 MG tablet Take 5 mg by mouth daily.     Marland Kitchen atenolol (TENORMIN) 25 MG tablet Take 1 tablet (25 mg total) by mouth 2 (two) times daily. 60 tablet 6  . cholecalciferol (VITAMIN D) 1000 UNITS tablet Take 1,000 Units by mouth daily.    . febuxostat (ULORIC) 40 MG tablet Take 40 mg by mouth daily.    . fish oil-omega-3 fatty acids 1000 MG capsule Take 1 g by mouth daily.    . furosemide (LASIX) 40 MG tablet Take 1  tablet (40 mg total) by mouth every other day. 30 tablet 3  . nitroGLYCERIN (NITROSTAT) 0.4 MG SL tablet Place 1 tablet (0.4 mg total) under the tongue every 5 (five) minutes x 3 doses as needed for chest pain. 25 tablet 3  . simvastatin (ZOCOR) 10 MG tablet Take 10 mg by mouth at bedtime.    . sitaGLIPtin-metformin (JANUMET) 50-500 MG per tablet Take 1 tablet by mouth 2 (two) times daily with a meal.    . warfarin (COUMADIN) 5 MG tablet Take 1 tablet daily except 1 1/2 tablet on Tuesdays, Thursdays and Saturdays or as directed 45 tablet 6  . benazepril (LOTENSIN) 20 MG tablet Take 20 mg by mouth daily.     Marland Kitchen spironolactone (ALDACTONE) 25 MG tablet Take 25 mg by mouth daily at 12 noon. Noon     No facility-administered medications prior to visit.    PAST MEDICAL HISTORY: Past Medical History  Diagnosis Date  . Type 2 diabetes mellitus   . Essential hypertension, benign   . CVA (cerebral infarction)   . Paroxysmal atrial fibrillation     Detected on tele [facility] 3/14   . Hyperlipidemia   . Carotid artery disease     Nonobstructive; < 50% bilateral ICA stenosis, 07/2007.  Marland Kitchen Coronary atherosclerosis of native coronary artery     75% distal LAD, 75% diagonal, DES to mid RCA - March 2014    PAST SURGICAL HISTORY: Past Surgical History  Procedure Laterality Date  . Cholecystectomy    . Bilateral carpal tunnel release    .  Cervical neck fusion    . Hernia repair      FAMILY HISTORY: Family History  Problem Relation Age of Onset  . Stroke Father   . Stroke Mother     SOCIAL HISTORY: History   Social History  . Marital Status: Widowed    Spouse Name: N/A    Number of Children: 1  . Years of Education: 9   Occupational History  . Retired    Social History Main Topics  . Smoking status: Never Smoker   . Smokeless tobacco: Never Used  . Alcohol Use: No  . Drug Use: No  . Sexual Activity: Not on file   Other Topics Concern  . Not on file   Social History Narrative    Patient is a widow and lives alone.   Patient has one child.   Patient is retired.   Patient is right-handed.   Patient drinks one cup of coffee in the am, sometimes.   Patient has a high school education.     PHYSICAL EXAM  Filed Vitals:   07/14/14 1400  BP: 121/69  Pulse: 53  Height: 5\' 2"  (1.575 m)  Weight: 212 lb 3.2 oz (96.253 kg)   Body mass index is 38.8 kg/(m^2). Generalized: Well developed, in no acute distress  Head: normocephalic and atraumatic,. Oropharynx benign  Neck: Supple, no carotid bruits  Cardiac: Regular rate rhythm, no murmur  Musculoskeletal: No deformity   Neurological examination   Mentation: Alert oriented to time, place, history taking. Follows all commands speech and language fluent  Cranial nerve II-XII: Pupils were equal round reactive to light extraocular movements were full, visual field were full on confrontational test. Facial sensation and strength were normal. hearing was intact to finger rubbing bilaterally. Uvula tongue midline. head turning and shoulder shrug were normal and symmetric.Tongue protrusion into cheek strength was normal. Motor: normal bulk and tone, full strength in the BUE, BLE, fine finger movements normal, no pronator drift. No focal weakness Sensory: normal and symmetric to light touch, pinprick, and vibration  Coordination: finger-nose-finger, heel-to-shin bilaterally, no dysmetria Reflexes: 1+ upper lower and symmetric, plantar responses were flexor bilaterally. Gait and Station: Rising up from seated position without assistance, normal stance, moderate stride, good arm swing, smooth turning, Tandem gait is mildly unsteady. No assistive device   DIAGNOSTIC DATA (LABS, IMAGING, TESTING) - I reviewed patient records, labs, notes, testing and imaging myself where available.  Lab Results  Component Value Date   WBC 6.8 11/26/2012   HGB 10.4* 11/26/2012   HCT 31.8* 11/26/2012   MCV 85.9 11/26/2012   PLT 319  11/26/2012      Component Value Date/Time   NA 140 11/26/2012 0638   K 4.0 11/26/2012 0638   CL 105 11/26/2012 0638   CO2 28 11/26/2012 0638   GLUCOSE 135* 11/26/2012 0638   BUN 23 11/26/2012 0638   CREATININE 1.03 11/26/2012 0638   CALCIUM 9.0 11/26/2012 0638   PROT 6.0 05/05/2009 0620   ALBUMIN 2.8* 05/05/2009 0620   AST 22 05/05/2009 0620   ALT 22 05/05/2009 0620   ALKPHOS 65 05/05/2009 0620   BILITOT 0.7 05/05/2009 0620   GFRNONAA 54* 11/26/2012 0638   GFRAA 63* 11/26/2012 [id number]     ASSESSMENT AND PLAN  68 y.o. year old female  has a past medical history of Type 2 diabetes mellitus; Essential hypertension, benign; CVA (cerebral infarction);Hyperlipidemia; Carotid artery disease; and Coronary atherosclerosis of native coronary artery here to follow up.The patient is a current  patient of Dr. Leonie Man who is out of the office today . This note is sent to the work in doctor.      Continue Coumadin for secondary stroke prevention B/P in good control (121/69) Continue with cane for safety with ambulation F/U yearly and prn Dennie Bible, [facility], [facility], APRN  [facility] [address], [place], [place] [address] [phone]

## 2014-07-23 ENCOUNTER — Ambulatory Visit (INDEPENDENT_AMBULATORY_CARE_PROVIDER_SITE_OTHER): Payer: Medicare Other | Admitting: *Deleted

## 2014-07-23 DIAGNOSIS — Z7901 Long term (current) use of anticoagulants: Secondary | ICD-10-CM

## 2014-07-23 DIAGNOSIS — Z8673 Personal history of transient ischemic attack (TIA), and cerebral infarction without residual deficits: Secondary | ICD-10-CM

## 2014-07-23 DIAGNOSIS — Z5181 Encounter for therapeutic drug level monitoring: Secondary | ICD-10-CM

## 2014-07-23 DIAGNOSIS — I480 Paroxysmal atrial fibrillation: Secondary | ICD-10-CM

## 2014-07-23 LAB — POCT INR: INR: 3.5

## 2014-07-29 ENCOUNTER — Encounter: Payer: Self-pay | Admitting: Neurology

## 2014-08-04 ENCOUNTER — Encounter: Payer: Self-pay | Admitting: Neurology

## 2014-08-13 ENCOUNTER — Ambulatory Visit (INDEPENDENT_AMBULATORY_CARE_PROVIDER_SITE_OTHER): Payer: Medicare Other | Admitting: *Deleted

## 2014-08-13 DIAGNOSIS — Z8673 Personal history of transient ischemic attack (TIA), and cerebral infarction without residual deficits: Secondary | ICD-10-CM

## 2014-08-13 DIAGNOSIS — Z7901 Long term (current) use of anticoagulants: Secondary | ICD-10-CM

## 2014-08-13 DIAGNOSIS — I480 Paroxysmal atrial fibrillation: Secondary | ICD-10-CM

## 2014-08-13 DIAGNOSIS — Z5181 Encounter for therapeutic drug level monitoring: Secondary | ICD-10-CM

## 2014-08-13 LAB — POCT INR: INR: 5.6

## 2014-08-17 ENCOUNTER — Other Ambulatory Visit: Payer: Self-pay | Admitting: *Deleted

## 2014-08-17 MED ORDER — ATENOLOL 25 MG PO TABS: 25.0000 mg | ORAL_TABLET | Freq: Two times a day (BID) | ORAL | Status: DC

## 2014-08-17 NOTE — Telephone Encounter (Signed)
Atenolol refilled. 

## 2014-08-20 ENCOUNTER — Ambulatory Visit (INDEPENDENT_AMBULATORY_CARE_PROVIDER_SITE_OTHER): Payer: Medicare Other | Admitting: *Deleted

## 2014-08-20 ENCOUNTER — Encounter (HOSPITAL_COMMUNITY): Payer: Self-pay | Admitting: Cardiology

## 2014-08-20 DIAGNOSIS — Z7901 Long term (current) use of anticoagulants: Secondary | ICD-10-CM

## 2014-08-20 DIAGNOSIS — Z5181 Encounter for therapeutic drug level monitoring: Secondary | ICD-10-CM

## 2014-08-20 DIAGNOSIS — Z8673 Personal history of transient ischemic attack (TIA), and cerebral infarction without residual deficits: Secondary | ICD-10-CM

## 2014-08-20 DIAGNOSIS — I480 Paroxysmal atrial fibrillation: Secondary | ICD-10-CM

## 2014-08-20 LAB — POCT INR: INR: 1.6

## 2014-09-01 ENCOUNTER — Ambulatory Visit (INDEPENDENT_AMBULATORY_CARE_PROVIDER_SITE_OTHER): Payer: Medicare Other | Admitting: *Deleted

## 2014-09-01 DIAGNOSIS — Z7901 Long term (current) use of anticoagulants: Secondary | ICD-10-CM

## 2014-09-01 DIAGNOSIS — Z8673 Personal history of transient ischemic attack (TIA), and cerebral infarction without residual deficits: Secondary | ICD-10-CM

## 2014-09-01 DIAGNOSIS — I480 Paroxysmal atrial fibrillation: Secondary | ICD-10-CM

## 2014-09-01 DIAGNOSIS — Z5181 Encounter for therapeutic drug level monitoring: Secondary | ICD-10-CM

## 2014-09-01 LAB — POCT INR: INR: 4.6

## 2014-09-15 ENCOUNTER — Ambulatory Visit (INDEPENDENT_AMBULATORY_CARE_PROVIDER_SITE_OTHER): Payer: Medicare Other | Admitting: *Deleted

## 2014-09-15 DIAGNOSIS — I480 Paroxysmal atrial fibrillation: Secondary | ICD-10-CM

## 2014-09-15 DIAGNOSIS — Z8673 Personal history of transient ischemic attack (TIA), and cerebral infarction without residual deficits: Secondary | ICD-10-CM

## 2014-09-15 DIAGNOSIS — Z5181 Encounter for therapeutic drug level monitoring: Secondary | ICD-10-CM

## 2014-09-15 DIAGNOSIS — Z7901 Long term (current) use of anticoagulants: Secondary | ICD-10-CM

## 2014-09-15 LAB — POCT INR: INR: 7.9

## 2014-09-16 ENCOUNTER — Telehealth: Payer: Self-pay | Admitting: *Deleted

## 2014-09-16 NOTE — Telephone Encounter (Signed)
Patient would like return phone call / tgs

## 2014-09-16 NOTE — Telephone Encounter (Signed)
Pt called.  States she forgot to tell me yesterday she had an episode before Christmas where she "saw red".  Pt had told me this when I saw her for her INR appt on 12/22.  She denied any signs of bleeding.  Neither eye was red/bloodshot.  INR was 4.6  She was waiting to see eye doctor on Monday.  MD told her he did not see any problem with her eyes.  Pt wants to know if this problem could have been coming from her coumadin.  Informed pt that this 1 brief episode probably was not coming from her coumadin.  She has not had an episode since.

## 2014-09-22 ENCOUNTER — Ambulatory Visit (INDEPENDENT_AMBULATORY_CARE_PROVIDER_SITE_OTHER): Payer: Medicare Other | Admitting: *Deleted

## 2014-09-22 DIAGNOSIS — Z8673 Personal history of transient ischemic attack (TIA), and cerebral infarction without residual deficits: Secondary | ICD-10-CM

## 2014-09-22 DIAGNOSIS — I480 Paroxysmal atrial fibrillation: Secondary | ICD-10-CM

## 2014-09-22 DIAGNOSIS — Z7901 Long term (current) use of anticoagulants: Secondary | ICD-10-CM

## 2014-09-22 DIAGNOSIS — Z5181 Encounter for therapeutic drug level monitoring: Secondary | ICD-10-CM

## 2014-09-22 LAB — POCT INR: INR: 1.5

## 2014-10-07 NOTE — Progress Notes (Signed)
I reviewed above note and agree with the assessment and plan.  Rosalin Hawking, MD PhD Stroke Neurology 10/07/2014 12:45 PM

## 2014-11-13 NOTE — H&P (Signed)
TOTAL HIP ADMISSION H&P  Patient is admitted for left total hip arthroplasty, anterior approach.  Subjective:  Chief Complaint:    Left hip primary OA / pain  HPI: Bianca White, 69 y.o. female, has a history of pain and functional disability in the left hip(s) due to arthritis and patient has failed non-surgical conservative treatments for greater than 12 weeks to include NSAID's and/or analgesics, use of assistive devices and activity modification.  Onset of symptoms was gradual starting ~3 years ago with gradually worsening course since that time.The patient noted no past surgery on the left hip(s).  Patient currently rates pain in the left hip at 10 out of 10 with activity. Patient has night pain, worsening of pain with activity and weight bearing, trendelenberg gait, pain that interfers with activities of daily living and pain with passive range of motion. Patient has evidence of periarticular osteophytes and joint space narrowing by imaging studies. This condition presents safety issues increasing the risk of falls.  There is no current active infection.  Risks, benefits and expectations were discussed with the patient.  Risks including but not limited to the risk of anesthesia, blood clots, nerve damage, blood vessel damage, failure of the prosthesis, infection and up to and including death.  Patient understand the risks, benefits and expectations and wishes to proceed with surgery.   PCP: ADDIS,DANIEL, DO  D/C Plans:      SNF / home  Post-op Meds:       No Rx given  Tranexamic Acid:      To be given - topically (CVA, ,CAD  Decadron:      Is to be given  FYI:     Coumadin with branching Lovenox  Norco post-op    Patient Active Problem List   Diagnosis Date Noted  . Atherosclerosis of native arteries of the extremities with intermittent claudication 12/09/2013  . Encounter for therapeutic drug monitoring 10/14/2013  . Edema 09/16/2013  . Carotid artery occlusion with infarction  01/10/2013  . Neuralgia, neuritis, and radiculitis, unspecified 01/10/2013  . Long term (current) use of anticoagulants 01/07/2013  . Hyperlipidemia 11/26/2012  . History of CVA (cerebrovascular accident) 11/26/2012  . Paroxysmal atrial fibrillation 11/26/2012  . CAD (coronary artery disease), native coronary artery 11/26/2012  . Type 2 diabetes mellitus 11/26/2012  . Normocytic anemia 11/26/2012  . Essential hypertension, benign    Past Medical History  Diagnosis Date  . Type 2 diabetes mellitus   . Essential hypertension, benign   . CVA (cerebral infarction)   . Paroxysmal atrial fibrillation     Detected on tele [facility] 3/14   . Hyperlipidemia   . Carotid artery disease     Nonobstructive; < 50% bilateral ICA stenosis, 07/2007.  Marland Kitchen Coronary atherosclerosis of native coronary artery     75% distal LAD, 75% diagonal, DES to mid RCA - March 2014    Past Surgical History  Procedure Laterality Date  . Cholecystectomy    . Bilateral carpal tunnel release    . Cervical neck fusion    . Hernia repair    . Left heart catheterization with coronary angiogram N/A 11/25/2012    Procedure: LEFT HEART CATHETERIZATION WITH CORONARY ANGIOGRAM;  Surgeon: Hillary Bow, MD;  Location: [facility] CATH LAB;  Service: Cardiovascular;  Laterality: N/A;  . Percutaneous coronary stent intervention (pci-s)  11/25/2012    Procedure: PERCUTANEOUS CORONARY STENT INTERVENTION (PCI-S);  Surgeon: Hillary Bow, MD;  Location: [facility] CATH LAB;  Service: Cardiovascular;;  No prescriptions prior to admission   Allergies  Allergen Reactions  . Dilaudid [Hydromorphone Hcl] Other (See Comments)    History  Substance Use Topics  . Smoking status: Never Smoker   . Smokeless tobacco: Never Used  . Alcohol Use: No    Family History  Problem Relation Age of Onset  . Stroke Father   . Stroke Mother      Review of Systems  HENT: Negative.   Eyes: Negative.   Respiratory: Negative.   Cardiovascular: Negative.    Gastrointestinal: Negative.   Genitourinary: Negative.   Musculoskeletal: Positive for back pain and joint pain.  Skin: Negative.   Neurological: Positive for weakness.  Endo/Heme/Allergies: Negative.   Psychiatric/Behavioral: Negative.     Objective:  Physical Exam  Constitutional: She is oriented to person, place, and time. She appears well-developed and well-nourished.  HENT:  Head: Normocephalic and atraumatic.  Eyes: Pupils are equal, round, and reactive to light.  Neck: Neck supple. No JVD present. No tracheal deviation present. No thyromegaly present.  Cardiovascular: Normal rate, regular rhythm, normal heart sounds and intact distal pulses.   Respiratory: Effort normal and breath sounds normal. No stridor.  GI: Soft. There is no tenderness. There is no guarding.  Musculoskeletal:       Left hip: She exhibits decreased range of motion, decreased strength, tenderness and bony tenderness. She exhibits no swelling, no deformity and no laceration.  Lymphadenopathy:    She has no cervical adenopathy.  Neurological: She is alert and oriented to person, place, and time.  Skin: Skin is warm and dry.  Psychiatric: She has a normal mood and affect.     Labs:  Estimated body mass index is 38.80 kg/(m^2) as calculated from the following:   Height as of 07/14/14: 5\' 2"  (1.575 m).   Weight as of 07/14/14: 96.253 kg (212 lb 3.2 oz).   Imaging Review Plain radiographs demonstrate severe degenerative joint disease of the left hip(s). The bone quality appears to be good for age and reported activity level.  Assessment/Plan:  End stage arthritis, left hip(s)  The patient history, physical examination, clinical judgement of the provider and imaging studies are consistent with end stage degenerative joint disease of the left hip(s) and total hip arthroplasty is deemed medically necessary. The treatment options including medical management, injection therapy, arthroscopy and arthroplasty  were discussed at length. The risks and benefits of total hip arthroplasty were presented and reviewed. The risks due to aseptic loosening, infection, stiffness, dislocation/subluxation,  thromboembolic complications and other imponderables were discussed.  The patient acknowledged the explanation, agreed to proceed with the plan and consent was signed. Patient is being admitted for inpatient treatment for surgery, pain control, PT, OT, prophylactic antibiotics, VTE prophylaxis, progressive ambulation and ADL's and discharge planning.The patient is planning to be discharged to skilled nursing facility / home.     West Pugh Tena Linebaugh   PA-C  11/13/2014, 10:28 AM

## 2014-11-17 ENCOUNTER — Other Ambulatory Visit (HOSPITAL_COMMUNITY): Payer: Self-pay | Admitting: *Deleted

## 2014-11-17 NOTE — Patient Instructions (Addendum)
Bianca White  11/17/2014   Your procedure is scheduled on: Tuesday November 24, 2014  Report to [facility] Main  Entrance and follow signs to               [place] at 830 AM.  Call this number if you have problems the morning of surgery [phone]   Remember:  Do not eat food or drink liquids  :After Midnight Monday night    Take these medicines the morning of surgery with A SIP OF WATER: Amlodipine, Atenolol                               You may not have any metal on your body including hair pins and              piercings  Do not wear jewelry, make-up, lotions, powders or perfumes.             Do not wear nail polish.  Do not shave  48 hours prior to surgery.              Men may shave face and neck.   Do not bring valuables to the hospital. [place].  Contacts, dentures or bridgework may not be worn into surgery.  Leave suitcase in the car. After surgery it may be brought to your room.     Patients discharged the day of surgery will not be allowed to drive home.  Name and phone number of your driver:  Special Instructions: N/A              Please read over the following fact sheets you were given: _____________________________________________________________________            [facility] - Preparing for Surgery Before surgery, you can play an important role.  Because skin is not sterile, your skin needs to be as free of germs as possible.  You can reduce the number of germs on your skin by washing with CHG (chlorahexidine gluconate) soap before surgery.  CHG is an antiseptic cleaner which kills germs and bonds with the skin to continue killing germs even after washing. Please DO NOT use if you have an allergy to CHG or antibacterial soaps.  If your skin becomes reddened/irritated stop using the CHG and inform your nurse when you arrive at Short Stay. Do not shave (including legs and  underarms) for at least 48 hours prior to the first CHG shower.  You may shave your face/neck. Please follow these instructions carefully:  1.  Shower with CHG Soap the night before surgery and the  morning of Surgery.  2.  If you choose to wash your hair, wash your hair first as usual with your  normal  shampoo.  3.  After you shampoo, rinse your hair and body thoroughly to remove the  shampoo.                           4.  Use CHG as you would any other liquid soap.  You can apply chg directly  to the skin and wash  Gently with a scrungie or clean washcloth.  5.  Apply the CHG Soap to your body ONLY FROM THE NECK DOWN.   Do not use on face/ open                           Wound or open sores. Avoid contact with eyes, ears mouth and genitals (private parts).                       Wash face,  Genitals (private parts) with your normal soap.             6.  Wash thoroughly, paying special attention to the area where your surgery  will be performed.  7.  Thoroughly rinse your body with warm water from the neck down.  8.  DO NOT shower/wash with your normal soap after using and rinsing off  the CHG Soap.                9.  Pat yourself dry with a clean towel.            10.  Wear clean pajamas.            11.  Place clean sheets on your bed the night of your first shower and do not  sleep with pets. Day of Surgery : Do not apply any lotions/deodorants the morning of surgery.  Please wear clean clothes to the hospital/surgery center.  FAILURE TO FOLLOW THESE INSTRUCTIONS MAY RESULT IN THE CANCELLATION OF YOUR SURGERY PATIENT SIGNATURE_________________________________  NURSE SIGNATURE__________________________________  ________________________________________________________________________ [facility] - Preparing for Surgery Before surgery, you can play an important role.  Because skin is not sterile, your skin needs to be as free of germs as possible.  You can reduce the  number of germs on your skin by washing with CHG (chlorahexidine gluconate) soap before surgery.  CHG is an antiseptic cleaner which kills germs and bonds with the skin to continue killing germs even after washing. Please DO NOT use if you have an allergy to CHG or antibacterial soaps.  If your skin becomes reddened/irritated stop using the CHG and inform your nurse when you arrive at Short Stay. Do not shave (including legs and underarms) for at least 48 hours prior to the first CHG shower.  You may shave your face/neck. Please follow these instructions carefully:  1.  Shower with CHG Soap the night before surgery and the  morning of Surgery.  2.  If you choose to wash your hair, wash your hair first as usual with your  normal  shampoo.  3.  After you shampoo, rinse your hair and body thoroughly to remove the  shampoo.                           4.  Use CHG as you would any other liquid soap.  You can apply chg directly  to the skin and wash                       Gently with a scrungie or clean washcloth.  5.  Apply the CHG Soap to your body ONLY FROM THE NECK DOWN.   Do not use on face/ open                           Wound or  open sores. Avoid contact with eyes, ears mouth and genitals (private parts).                       Wash face,  Genitals (private parts) with your normal soap.             6.  Wash thoroughly, paying special attention to the area where your surgery  will be performed.  7.  Thoroughly rinse your body with warm water from the neck down.  8.  DO NOT shower/wash with your normal soap after using and rinsing off  the CHG Soap.                9.  Pat yourself dry with a clean towel.            10.  Wear clean pajamas.            11.  Place clean sheets on your bed the night of your first shower and do not  sleep with pets. Day of Surgery : Do not apply any lotions/deodorants the morning of surgery.  Please wear clean clothes to the hospital/surgery center.  FAILURE TO FOLLOW  THESE INSTRUCTIONS MAY RESULT IN THE CANCELLATION OF YOUR SURGERY PATIENT SIGNATURE_________________________________  NURSE SIGNATURE__________________________________  ________________________________________________________________________ [facility] - Preparing for Surgery Before surgery, you can play an important role.  Because skin is not sterile, your skin needs to be as free of germs as possible.  You can reduce the number of germs on your skin by washing with CHG (chlorahexidine gluconate) soap before surgery.  CHG is an antiseptic cleaner which kills germs and bonds with the skin to continue killing germs even after washing. Please DO NOT use if you have an allergy to CHG or antibacterial soaps.  If your skin becomes reddened/irritated stop using the CHG and inform your nurse when you arrive at Short Stay. Do not shave (including legs and underarms) for at least 48 hours prior to the first CHG shower.  You may shave your face/neck. Please follow these instructions carefully:  1.  Shower with CHG Soap the night before surgery and the  morning of Surgery.  2.  If you choose to wash your hair, wash your hair first as usual with your  normal  shampoo.  3.  After you shampoo, rinse your hair and body thoroughly to remove the  shampoo.                           4.  Use CHG as you would any other liquid soap.  You can apply chg directly  to the skin and wash                       Gently with a scrungie or clean washcloth.  5.  Apply the CHG Soap to your body ONLY FROM THE NECK DOWN.   Do not use on face/ open                           Wound or open sores. Avoid contact with eyes, ears mouth and genitals (private parts).                       Wash face,  Genitals (private parts) with your normal soap.             6.  Wash thoroughly, paying special attention to the area where your surgery  will be performed.  7.  Thoroughly rinse your body with warm water from the neck down.  8.  DO NOT  shower/wash with your normal soap after using and rinsing off  the CHG Soap.                9.  Pat yourself dry with a clean towel.            10.  Wear clean pajamas.            11.  Place clean sheets on your bed the night of your first shower and do not  sleep with pets. Day of Surgery : Do not apply any lotions/deodorants the morning of surgery.  Please wear clean clothes to the hospital/surgery center.  FAILURE TO FOLLOW THESE INSTRUCTIONS MAY RESULT IN THE CANCELLATION OF YOUR SURGERY PATIENT SIGNATURE_________________________________  NURSE SIGNATURE__________________________________  ________________________________________________________________________      Adam Phenix  An incentive spirometer is a tool that can help keep your lungs clear and active. This tool measures how well you are filling your lungs with each breath. Taking long deep breaths may help reverse or decrease the chance of developing breathing (pulmonary) problems (especially infection) following:  A long period of time when you are unable to move or be active. BEFORE THE PROCEDURE   If the spirometer includes an indicator to show your best effort, your nurse or respiratory therapist will set it to a desired goal.  If possible, sit up straight or lean slightly forward. Try not to slouch.  Hold the incentive spirometer in an upright position. INSTRUCTIONS FOR USE   Sit on the edge of your bed if possible, or sit up as far as you can in bed or on a chair.  Hold the incentive spirometer in an upright position.  Breathe out normally.  Place the mouthpiece in your mouth and seal your lips tightly around it.  Breathe in slowly and as deeply as possible, raising the piston or the ball toward the top of the column.  Hold your breath for 3-5 seconds or for as long as possible. Allow the piston or ball to fall to the bottom of the column.  Remove the mouthpiece from your mouth and breathe out  normally.  Rest for a few seconds and repeat Steps 1 through 7 at least 10 times every 1-2 hours when you are awake. Take your time and take a few normal breaths between deep breaths.  The spirometer may include an indicator to show your best effort. Use the indicator as a goal to work toward during each repetition.  After each set of 10 deep breaths, practice coughing to be sure your lungs are clear. If you have an incision (the cut made at the time of surgery), support your incision when coughing by placing a pillow or rolled up towels firmly against it. Once you are able to get out of bed, walk around indoors and cough well. You may stop using the incentive spirometer when instructed by your caregiver.  RISKS AND COMPLICATIONS  Take your time so you do not get dizzy or light-headed.  If you are in pain, you may need to take or ask for pain medication before doing incentive spirometry. It is harder to take a deep breath if you are having pain. AFTER USE  Rest and breathe slowly and easily.  It can be helpful to keep track of a log  of your progress. Your caregiver can provide you with a simple table to help with this. If you are using the spirometer at home, follow these instructions: [place] IF:   You are having difficultly using the spirometer.  You have trouble using the spirometer as often as instructed.  Your pain medication is not giving enough relief while using the spirometer.  You develop fever of 100.5 F (38.1 C) or higher. SEEK IMMEDIATE MEDICAL CARE IF:   You cough up bloody sputum that had not been present before.  You develop fever of 102 F (38.9 C) or greater.  You develop worsening pain at or near the incision site. MAKE SURE YOU:   Understand these instructions.  Will watch your condition.  Will get help right away if you are not doing well or get worse. Document Released: 01/08/2007 Document Revised: 11/20/2011 Document Reviewed:  03/11/2007 ExitCare Patient Information 2014 [employer], [place].   ________________________________________________________________________  WHAT IS A BLOOD TRANSFUSION? Blood Transfusion Information  A transfusion is the replacement of blood or some of its parts. Blood is made up of multiple cells which provide different functions.  Red blood cells carry oxygen and are used for blood loss replacement.  White blood cells fight against infection.  Platelets control bleeding.  Plasma helps clot blood.  Other blood products are available for specialized needs, such as hemophilia or other clotting disorders. BEFORE THE TRANSFUSION  Who gives blood for transfusions?   Healthy volunteers who are fully evaluated to make sure their blood is safe. This is blood bank blood. Transfusion therapy is the safest it has ever been in the practice of medicine. Before blood is taken from a donor, a complete history is taken to make sure that person has no history of diseases nor engages in risky social behavior (examples are intravenous drug use or sexual activity with multiple partners). The donor's travel history is screened to minimize risk of transmitting infections, such as malaria. The donated blood is tested for signs of infectious diseases, such as HIV and hepatitis. The blood is then tested to be sure it is compatible with you in order to minimize the chance of a transfusion reaction. If you or a relative donates blood, this is often done in anticipation of surgery and is not appropriate for emergency situations. It takes many days to process the donated blood. RISKS AND COMPLICATIONS Although transfusion therapy is very safe and saves many lives, the main dangers of transfusion include:   Getting an infectious disease.  Developing a transfusion reaction. This is an allergic reaction to something in the blood you were given. Every precaution is taken to prevent this. The decision to have a blood  transfusion has been considered carefully by your caregiver before blood is given. Blood is not given unless the benefits outweigh the risks. AFTER THE TRANSFUSION  Right after receiving a blood transfusion, you will usually feel much better and more energetic. This is especially true if your red blood cells have gotten low (anemic). The transfusion raises the level of the red blood cells which carry oxygen, and this usually causes an energy increase.  The nurse administering the transfusion will monitor you carefully for complications. HOME CARE INSTRUCTIONS  No special instructions are needed after a transfusion. You may find your energy is better. Speak with your caregiver about any limitations on activity for underlying diseases you may have. SEEK MEDICAL CARE IF:   Your condition is not improving after your transfusion.  You develop  redness or irritation at the intravenous (IV) site. SEEK IMMEDIATE MEDICAL CARE IF:  Any of the following symptoms occur over the next 12 hours:  Shaking chills.  You have a temperature by mouth above 102 F (38.9 C), not controlled by medicine.  Chest, back, or muscle pain.  People around you feel you are not acting correctly or are confused.  Shortness of breath or difficulty breathing.  Dizziness and fainting.  You get a rash or develop hives.  You have a decrease in urine output.  Your urine turns a dark color or changes to pink, red, or brown. Any of the following symptoms occur over the next 10 days:  You have a temperature by mouth above 102 F (38.9 C), not controlled by medicine.  Shortness of breath.  Weakness after normal activity.  The white part of the eye turns yellow (jaundice).  You have a decrease in the amount of urine or are urinating less often.  Your urine turns a dark color or changes to pink, red, or brown. Document Released: 08/25/2000 Document Revised: 11/20/2011 Document Reviewed: 04/13/2008 [facility] Patient  Information 2014 [place], [place].  _______________________________________________________________________

## 2014-11-17 NOTE — Progress Notes (Signed)
ekg 12-28-13 epic lov Evlyn Courier np neurology 07-14-14 epic Loc cardiology dr Domenic Polite 05-12-14 epic

## 2014-11-18 ENCOUNTER — Encounter (HOSPITAL_COMMUNITY)
Admission: RE | Admit: 2014-11-18 | Discharge: 2014-11-18 | Disposition: A | Discharge status: Home / Self Care | Payer: Medicare Other | Source: Ambulatory Visit | Attending: Orthopedic Surgery | Admitting: Orthopedic Surgery

## 2014-11-18 ENCOUNTER — Encounter (HOSPITAL_COMMUNITY): Payer: Self-pay

## 2014-11-18 DIAGNOSIS — Z01818 Encounter for other preprocedural examination: Secondary | ICD-10-CM | POA: Diagnosis present

## 2014-11-18 DIAGNOSIS — I2510 Atherosclerotic heart disease of native coronary artery without angina pectoris: Secondary | ICD-10-CM | POA: Diagnosis not present

## 2014-11-18 DIAGNOSIS — M1612 Unilateral primary osteoarthritis, left hip: Secondary | ICD-10-CM | POA: Insufficient documentation

## 2014-11-18 HISTORY — DX: Cerebral infarction, unspecified: I63.9

## 2014-11-18 HISTORY — DX: Disorder of the skin and subcutaneous tissue, unspecified: L98.9

## 2014-11-18 LAB — SURGICAL PCR SCREEN
MRSA, PCR: NEGATIVE
STAPHYLOCOCCUS AUREUS: NEGATIVE

## 2014-11-18 LAB — CBC
HEMATOCRIT: 41.2 % (ref 36.0–46.0)
Hemoglobin: 13 g/dL (ref 12.0–15.0)
MCH: 28.8 pg (ref 26.0–34.0)
MCHC: 31.6 g/dL (ref 30.0–36.0)
MCV: 91.4 fL (ref 78.0–100.0)
Platelets: 321 10*3/uL (ref 150–400)
RBC: 4.51 MIL/uL (ref 3.87–5.11)
RDW: 14.1 % (ref 11.5–15.5)
WBC: 8.4 10*3/uL (ref 4.0–10.5)

## 2014-11-18 LAB — URINALYSIS, ROUTINE W REFLEX MICROSCOPIC
Bilirubin Urine: NEGATIVE
GLUCOSE, UA: NEGATIVE mg/dL
KETONES UR: NEGATIVE mg/dL
LEUKOCYTES UA: NEGATIVE
Nitrite: NEGATIVE
PH: 6 (ref 5.0–8.0)
Protein, ur: 100 mg/dL — AB
Specific Gravity, Urine: 1.016 (ref 1.005–1.030)
Urobilinogen, UA: 0.2 mg/dL (ref 0.0–1.0)

## 2014-11-18 LAB — URINE MICROSCOPIC-ADD ON

## 2014-11-18 LAB — BASIC METABOLIC PANEL
Anion gap: 7 (ref 5–15)
BUN: 22 mg/dL (ref 6–23)
CHLORIDE: 104 mmol/L (ref 96–112)
CO2: 29 mmol/L (ref 19–32)
CREATININE: 1.12 mg/dL — AB (ref 0.50–1.10)
Calcium: 9.4 mg/dL (ref 8.4–10.5)
GFR calc Af Amer: 56 mL/min — ABNORMAL LOW (ref >90)
GFR calc non Af Amer: 48 mL/min — ABNORMAL LOW (ref >90)
Glucose, Bld: 109 mg/dL — ABNORMAL HIGH (ref 70–99)
POTASSIUM: 3.9 mmol/L (ref 3.5–5.1)
SODIUM: 140 mmol/L (ref 135–145)

## 2014-11-18 LAB — PROTIME-INR
INR: 1.85 — AB (ref 0.00–1.49)
Prothrombin Time: 21.5 seconds — ABNORMAL HIGH (ref 11.6–15.2)

## 2014-11-18 LAB — APTT: APTT: 39 s — AB (ref 24–37)

## 2014-11-23 NOTE — Progress Notes (Addendum)
CARDIOLOGY CLEARANCE NOTE DR Darral Dash ON CHART FOR 11-24-14 SURGERY ekg 09-25-14 dr Darral Dash on chart lov dr Darral Dash 11-19-14 on chart h and p note dr Darral Dash 09-25-14 on chart E d echo 10-14-14 dr Darral Dash on chart

## 2014-11-24 ENCOUNTER — Ambulatory Visit: Payer: Medicare Other | Admitting: Cardiology

## 2014-11-24 ENCOUNTER — Encounter (HOSPITAL_COMMUNITY): Payer: Self-pay | Admitting: *Deleted

## 2014-11-24 ENCOUNTER — Inpatient Hospital Stay (HOSPITAL_COMMUNITY)
Admission: RE | Admit: 2014-11-24 | Discharge: 2014-11-27 | DRG: 470 | Disposition: A | Discharge status: Home Health | Payer: Medicare Other | Source: Ambulatory Visit | Attending: Orthopedic Surgery | Admitting: Orthopedic Surgery

## 2014-11-24 ENCOUNTER — Inpatient Hospital Stay (HOSPITAL_COMMUNITY): Payer: Medicare Other

## 2014-11-24 ENCOUNTER — Inpatient Hospital Stay (HOSPITAL_COMMUNITY): Payer: Medicare Other | Admitting: Anesthesiology

## 2014-11-24 ENCOUNTER — Encounter (HOSPITAL_COMMUNITY)
Admission: RE | Disposition: A | Discharge status: Home Health | Payer: Self-pay | Source: Ambulatory Visit | Attending: Orthopedic Surgery

## 2014-11-24 DIAGNOSIS — I10 Essential (primary) hypertension: Secondary | ICD-10-CM | POA: Diagnosis present

## 2014-11-24 DIAGNOSIS — I480 Paroxysmal atrial fibrillation: Secondary | ICD-10-CM | POA: Diagnosis present

## 2014-11-24 DIAGNOSIS — I2510 Atherosclerotic heart disease of native coronary artery without angina pectoris: Secondary | ICD-10-CM | POA: Diagnosis present

## 2014-11-24 DIAGNOSIS — Z823 Family history of stroke: Secondary | ICD-10-CM

## 2014-11-24 DIAGNOSIS — Z96649 Presence of unspecified artificial hip joint: Secondary | ICD-10-CM

## 2014-11-24 DIAGNOSIS — Z8673 Personal history of transient ischemic attack (TIA), and cerebral infarction without residual deficits: Secondary | ICD-10-CM | POA: Diagnosis not present

## 2014-11-24 DIAGNOSIS — D62 Acute posthemorrhagic anemia: Secondary | ICD-10-CM | POA: Diagnosis not present

## 2014-11-24 DIAGNOSIS — E785 Hyperlipidemia, unspecified: Secondary | ICD-10-CM | POA: Diagnosis present

## 2014-11-24 DIAGNOSIS — Z6841 Body Mass Index (BMI) 40.0 and over, adult: Secondary | ICD-10-CM | POA: Diagnosis not present

## 2014-11-24 DIAGNOSIS — E119 Type 2 diabetes mellitus without complications: Secondary | ICD-10-CM | POA: Diagnosis present

## 2014-11-24 DIAGNOSIS — Z981 Arthrodesis status: Secondary | ICD-10-CM

## 2014-11-24 DIAGNOSIS — M1612 Unilateral primary osteoarthritis, left hip: Secondary | ICD-10-CM | POA: Diagnosis present

## 2014-11-24 DIAGNOSIS — Z7901 Long term (current) use of anticoagulants: Secondary | ICD-10-CM | POA: Diagnosis not present

## 2014-11-24 DIAGNOSIS — D500 Iron deficiency anemia secondary to blood loss (chronic): Secondary | ICD-10-CM | POA: Diagnosis not present

## 2014-11-24 DIAGNOSIS — M25552 Pain in left hip: Secondary | ICD-10-CM | POA: Diagnosis present

## 2014-11-24 HISTORY — PX: TOTAL HIP ARTHROPLASTY: SHX124

## 2014-11-24 LAB — ABO/RH: ABO/RH(D): AB POS

## 2014-11-24 LAB — GLUCOSE, CAPILLARY
GLUCOSE-CAPILLARY: 130 mg/dL — AB (ref 70–99)
GLUCOSE-CAPILLARY: 193 mg/dL — AB (ref 70–99)
Glucose-Capillary: 146 mg/dL — ABNORMAL HIGH (ref 70–99)

## 2014-11-24 LAB — PROTIME-INR
INR: 1.2 (ref 0.00–1.49)
PROTHROMBIN TIME: 15.4 s — AB (ref 11.6–15.2)

## 2014-11-24 LAB — CBC
HCT: 36.8 % (ref 36.0–46.0)
Hemoglobin: 12 g/dL (ref 12.0–15.0)
MCH: 29.1 pg (ref 26.0–34.0)
MCHC: 32.6 g/dL (ref 30.0–36.0)
MCV: 89.3 fL (ref 78.0–100.0)
PLATELETS: 246 10*3/uL (ref 150–400)
RBC: 4.12 MIL/uL (ref 3.87–5.11)
RDW: 14 % (ref 11.5–15.5)
WBC: 15.3 10*3/uL — AB (ref 4.0–10.5)

## 2014-11-24 LAB — CREATININE, SERUM
Creatinine, Ser: 1.07 mg/dL (ref 0.50–1.10)
GFR, EST AFRICAN AMERICAN: 59 mL/min — AB (ref >90)
GFR, EST NON AFRICAN AMERICAN: 51 mL/min — AB (ref >90)

## 2014-11-24 SURGERY — ARTHROPLASTY, HIP, TOTAL, ANTERIOR APPROACH
Anesthesia: Monitor Anesthesia Care | Site: Hip | Laterality: Left

## 2014-11-24 MED ORDER — MENTHOL 3 MG MT LOZG: 1.0000 | LOZENGE | OROMUCOSAL | Status: DC | PRN

## 2014-11-24 MED ORDER — MIDAZOLAM HCL 5 MG/5ML IJ SOLN
INTRAMUSCULAR | Status: DC | PRN
  Administered 2014-11-24: 2 mg via INTRAVENOUS

## 2014-11-24 MED ORDER — FENTANYL CITRATE 0.05 MG/ML IJ SOLN
25.0000 ug | INTRAMUSCULAR | Status: DC | PRN
  Administered 2014-11-24: 50 ug via INTRAVENOUS

## 2014-11-24 MED ORDER — DIPHENHYDRAMINE HCL 25 MG PO CAPS: 25.0000 mg | ORAL_CAPSULE | Freq: Four times a day (QID) | ORAL | Status: DC | PRN

## 2014-11-24 MED ORDER — LINAGLIPTIN 5 MG PO TABS
5.0000 mg | ORAL_TABLET | Freq: Every day | ORAL | Status: DC
  Administered 2014-11-25 – 2014-11-27 (×3): 5 mg via ORAL
  Filled 2014-11-24 (×4): qty 1

## 2014-11-24 MED ORDER — PROPOFOL 10 MG/ML IV BOLUS
INTRAVENOUS | Status: AC
  Filled 2014-11-24: qty 20

## 2014-11-24 MED ORDER — SIMVASTATIN 10 MG PO TABS
10.0000 mg | ORAL_TABLET | Freq: Every day | ORAL | Status: DC
  Administered 2014-11-24 – 2014-11-26 (×3): 10 mg via ORAL
  Filled 2014-11-24 (×4): qty 1

## 2014-11-24 MED ORDER — PROMETHAZINE HCL 25 MG/ML IJ SOLN
INTRAMUSCULAR | Status: AC
  Filled 2014-11-24: qty 1

## 2014-11-24 MED ORDER — ONDANSETRON HCL 4 MG/2ML IJ SOLN
INTRAMUSCULAR | Status: DC | PRN
  Administered 2014-11-24: 4 mg via INTRAVENOUS

## 2014-11-24 MED ORDER — FEBUXOSTAT 40 MG PO TABS
40.0000 mg | ORAL_TABLET | Freq: Every day | ORAL | Status: DC | PRN
  Filled 2014-11-24: qty 1

## 2014-11-24 MED ORDER — NITROGLYCERIN 0.4 MG SL SUBL: 0.4000 mg | SUBLINGUAL_TABLET | SUBLINGUAL | Status: DC | PRN

## 2014-11-24 MED ORDER — ATENOLOL 25 MG PO TABS
25.0000 mg | ORAL_TABLET | Freq: Two times a day (BID) | ORAL | Status: DC
  Administered 2014-11-24 – 2014-11-27 (×4): 25 mg via ORAL
  Filled 2014-11-24 (×7): qty 1

## 2014-11-24 MED ORDER — MAGNESIUM CITRATE PO SOLN: 1.0000 | Freq: Once | ORAL | Status: AC | PRN

## 2014-11-24 MED ORDER — TRANEXAMIC ACID 100 MG/ML IV SOLN
2000.0000 mg | INTRAVENOUS | Status: DC | PRN
  Administered 2014-11-24: 2000 mg via TOPICAL

## 2014-11-24 MED ORDER — DEXAMETHASONE SODIUM PHOSPHATE 10 MG/ML IJ SOLN
10.0000 mg | Freq: Once | INTRAMUSCULAR | Status: AC
Start: 2014-11-24 — End: 2014-11-24
  Administered 2014-11-24: 10 mg via INTRAVENOUS

## 2014-11-24 MED ORDER — CEFAZOLIN SODIUM-DEXTROSE 2-3 GM-% IV SOLR
2.0000 g | INTRAVENOUS | Status: AC
  Administered 2014-11-24: 2 g via INTRAVENOUS

## 2014-11-24 MED ORDER — PHENOL 1.4 % MT LIQD
1.0000 | OROMUCOSAL | Status: DC | PRN
  Filled 2014-11-24: qty 177

## 2014-11-24 MED ORDER — FENTANYL CITRATE 0.05 MG/ML IJ SOLN
INTRAMUSCULAR | Status: AC
  Filled 2014-11-24: qty 2

## 2014-11-24 MED ORDER — ONDANSETRON HCL 4 MG PO TABS: 4.0000 mg | ORAL_TABLET | Freq: Four times a day (QID) | ORAL | Status: DC | PRN

## 2014-11-24 MED ORDER — METOCLOPRAMIDE HCL 5 MG/ML IJ SOLN: 5.0000 mg | Freq: Three times a day (TID) | INTRAMUSCULAR | Status: DC | PRN

## 2014-11-24 MED ORDER — INSULIN ASPART 100 UNIT/ML ~~LOC~~ SOLN
0.0000 [IU] | Freq: Three times a day (TID) | SUBCUTANEOUS | Status: DC
  Administered 2014-11-24: 3 [IU] via SUBCUTANEOUS
  Administered 2014-11-25 (×2): 5 [IU] via SUBCUTANEOUS
  Administered 2014-11-25 – 2014-11-26 (×2): 3 [IU] via SUBCUTANEOUS
  Administered 2014-11-26: 2 [IU] via SUBCUTANEOUS
  Administered 2014-11-26: 3 [IU] via SUBCUTANEOUS
  Administered 2014-11-27: 2 [IU] via SUBCUTANEOUS
  Administered 2014-11-27: 3 [IU] via SUBCUTANEOUS

## 2014-11-24 MED ORDER — ONDANSETRON HCL 4 MG/2ML IJ SOLN: 4.0000 mg | Freq: Four times a day (QID) | INTRAMUSCULAR | Status: DC | PRN

## 2014-11-24 MED ORDER — SITAGLIPTIN PHOS-METFORMIN HCL 50-500 MG PO TABS: 1.0000 | ORAL_TABLET | Freq: Two times a day (BID) | ORAL | Status: DC

## 2014-11-24 MED ORDER — LACTATED RINGERS IV SOLN
INTRAVENOUS | Status: DC
  Administered 2014-11-24: 1000 mL via INTRAVENOUS
  Administered 2014-11-24 (×2): via INTRAVENOUS

## 2014-11-24 MED ORDER — CEFAZOLIN SODIUM-DEXTROSE 2-3 GM-% IV SOLR
2.0000 g | Freq: Four times a day (QID) | INTRAVENOUS | Status: AC
  Administered 2014-11-24 – 2014-11-25 (×2): 2 g via INTRAVENOUS
  Filled 2014-11-24 (×2): qty 50

## 2014-11-24 MED ORDER — FENTANYL CITRATE 0.05 MG/ML IJ SOLN
INTRAMUSCULAR | Status: DC | PRN
  Administered 2014-11-24: 25 ug via INTRAVENOUS
  Administered 2014-11-24: 50 ug via INTRAVENOUS
  Administered 2014-11-24: 25 ug via INTRAVENOUS
  Administered 2014-11-24: 50 ug via INTRAVENOUS

## 2014-11-24 MED ORDER — BUPIVACAINE IN DEXTROSE 0.75-8.25 % IT SOLN
INTRATHECAL | Status: DC | PRN
  Administered 2014-11-24: 1.6 mL via INTRATHECAL

## 2014-11-24 MED ORDER — METOCLOPRAMIDE HCL 10 MG PO TABS: 5.0000 mg | ORAL_TABLET | Freq: Three times a day (TID) | ORAL | Status: DC | PRN

## 2014-11-24 MED ORDER — TRANEXAMIC ACID 100 MG/ML IV SOLN
2000.0000 mg | Freq: Once | INTRAVENOUS | Status: DC
  Filled 2014-11-24: qty 20

## 2014-11-24 MED ORDER — EPHEDRINE SULFATE 50 MG/ML IJ SOLN
INTRAMUSCULAR | Status: DC | PRN
  Administered 2014-11-24: 10 mg via INTRAVENOUS
  Administered 2014-11-24: 5 mg via INTRAVENOUS

## 2014-11-24 MED ORDER — ALUM & MAG HYDROXIDE-SIMETH 200-200-20 MG/5ML PO SUSP
30.0000 mL | ORAL | Status: DC | PRN
  Administered 2014-11-26: 30 mL via ORAL
  Filled 2014-11-24: qty 30

## 2014-11-24 MED ORDER — METFORMIN HCL 500 MG PO TABS
500.0000 mg | ORAL_TABLET | Freq: Two times a day (BID) | ORAL | Status: DC
  Administered 2014-11-24 – 2014-11-25 (×3): 500 mg via ORAL
  Filled 2014-11-24 (×7): qty 1

## 2014-11-24 MED ORDER — SODIUM CHLORIDE 0.9 % IV SOLN
100.0000 mL/h | INTRAVENOUS | Status: DC
  Administered 2014-11-24 – 2014-11-25 (×2): 100 mL/h via INTRAVENOUS
  Filled 2014-11-24 (×10): qty 1000

## 2014-11-24 MED ORDER — GLIPIZIDE 5 MG PO TABS
5.0000 mg | ORAL_TABLET | Freq: Every day | ORAL | Status: DC
  Administered 2014-11-25 – 2014-11-27 (×3): 5 mg via ORAL
  Filled 2014-11-24 (×4): qty 1

## 2014-11-24 MED ORDER — CEFAZOLIN SODIUM-DEXTROSE 2-3 GM-% IV SOLR
INTRAVENOUS | Status: AC
  Filled 2014-11-24: qty 50

## 2014-11-24 MED ORDER — METHOCARBAMOL 500 MG PO TABS
500.0000 mg | ORAL_TABLET | Freq: Four times a day (QID) | ORAL | Status: DC | PRN
  Administered 2014-11-27: 500 mg via ORAL
  Filled 2014-11-24: qty 1

## 2014-11-24 MED ORDER — FERROUS SULFATE 325 (65 FE) MG PO TABS
325.0000 mg | ORAL_TABLET | Freq: Three times a day (TID) | ORAL | Status: DC
  Administered 2014-11-25 – 2014-11-27 (×8): 325 mg via ORAL
  Filled 2014-11-24 (×11): qty 1

## 2014-11-24 MED ORDER — BISACODYL 10 MG RE SUPP: 10.0000 mg | Freq: Every day | RECTAL | Status: DC | PRN

## 2014-11-24 MED ORDER — CELECOXIB 200 MG PO CAPS
200.0000 mg | ORAL_CAPSULE | Freq: Two times a day (BID) | ORAL | Status: DC
  Administered 2014-11-24 – 2014-11-27 (×6): 200 mg via ORAL
  Filled 2014-11-24 (×7): qty 1

## 2014-11-24 MED ORDER — SODIUM CHLORIDE 0.9 % IJ SOLN
INTRAMUSCULAR | Status: AC
  Filled 2014-11-24: qty 10

## 2014-11-24 MED ORDER — PROPOFOL INFUSION 10 MG/ML OPTIME
INTRAVENOUS | Status: DC | PRN
  Administered 2014-11-24: 140 ug/kg/min via INTRAVENOUS

## 2014-11-24 MED ORDER — AMLODIPINE BESYLATE 5 MG PO TABS
5.0000 mg | ORAL_TABLET | Freq: Every day | ORAL | Status: DC
  Administered 2014-11-26: 5 mg via ORAL
  Filled 2014-11-24 (×3): qty 1

## 2014-11-24 MED ORDER — WARFARIN SODIUM 7.5 MG PO TABS
7.5000 mg | ORAL_TABLET | Freq: Once | ORAL | Status: AC
  Administered 2014-11-24: 7.5 mg via ORAL
  Filled 2014-11-24: qty 1

## 2014-11-24 MED ORDER — HYDROCODONE-ACETAMINOPHEN 7.5-325 MG PO TABS
1.0000 | ORAL_TABLET | ORAL | Status: DC
  Administered 2014-11-24 – 2014-11-26 (×8): 1 via ORAL
  Administered 2014-11-26: 2 via ORAL
  Administered 2014-11-26 – 2014-11-27 (×5): 1 via ORAL
  Filled 2014-11-24 (×3): qty 2
  Filled 2014-11-24 (×2): qty 1
  Filled 2014-11-24 (×2): qty 2
  Filled 2014-11-24 (×2): qty 1
  Filled 2014-11-24: qty 2
  Filled 2014-11-24 (×4): qty 1
  Filled 2014-11-24: qty 2

## 2014-11-24 MED ORDER — POLYETHYLENE GLYCOL 3350 17 G PO PACK
17.0000 g | PACK | Freq: Two times a day (BID) | ORAL | Status: DC
  Administered 2014-11-24 – 2014-11-27 (×6): 17 g via ORAL

## 2014-11-24 MED ORDER — METHOCARBAMOL 1000 MG/10ML IJ SOLN
500.0000 mg | Freq: Four times a day (QID) | INTRAVENOUS | Status: DC | PRN
  Administered 2014-11-24: 500 mg via INTRAVENOUS
  Filled 2014-11-24 (×2): qty 5

## 2014-11-24 MED ORDER — SODIUM CHLORIDE 0.9 % IR SOLN
Status: DC | PRN
  Administered 2014-11-24: 1000 mL

## 2014-11-24 MED ORDER — MIDAZOLAM HCL 2 MG/2ML IJ SOLN
INTRAMUSCULAR | Status: AC
  Filled 2014-11-24: qty 2

## 2014-11-24 MED ORDER — ENOXAPARIN SODIUM 40 MG/0.4ML ~~LOC~~ SOLN
40.0000 mg | SUBCUTANEOUS | Status: DC
  Administered 2014-11-25 – 2014-11-27 (×3): 40 mg via SUBCUTANEOUS
  Filled 2014-11-24 (×4): qty 0.4

## 2014-11-24 MED ORDER — DEXAMETHASONE SODIUM PHOSPHATE 10 MG/ML IJ SOLN: 10.0000 mg | Freq: Once | INTRAMUSCULAR | Status: DC

## 2014-11-24 MED ORDER — FENTANYL CITRATE 0.05 MG/ML IJ SOLN
INTRAMUSCULAR | Status: AC
Start: 2014-11-24 — End: 2014-11-24
  Filled 2014-11-24: qty 2

## 2014-11-24 MED ORDER — WARFARIN - PHARMACIST DOSING INPATIENT: Freq: Every day | Status: DC

## 2014-11-24 MED ORDER — DOCUSATE SODIUM 100 MG PO CAPS
100.0000 mg | ORAL_CAPSULE | Freq: Two times a day (BID) | ORAL | Status: DC
  Administered 2014-11-24 – 2014-11-27 (×6): 100 mg via ORAL

## 2014-11-24 MED ORDER — PROMETHAZINE HCL 25 MG/ML IJ SOLN
6.2500 mg | INTRAMUSCULAR | Status: DC | PRN
  Administered 2014-11-24: 6.25 mg via INTRAVENOUS

## 2014-11-24 MED ORDER — MORPHINE SULFATE 2 MG/ML IJ SOLN: 1.0000 mg | INTRAMUSCULAR | Status: DC | PRN

## 2014-11-24 SURGICAL SUPPLY — 44 items
BAG DECANTER FOR FLEXI CONT (MISCELLANEOUS) ×3 IMPLANT
BAG ZIPLOCK 12X15 (MISCELLANEOUS) IMPLANT
CAPT HIP TOTAL 2 ×3 IMPLANT
COVER PERINEAL POST (MISCELLANEOUS) ×3 IMPLANT
DRAPE C-ARM 42X120 X-RAY (DRAPES) ×3 IMPLANT
DRAPE STERI IOBAN 125X83 (DRAPES) ×3 IMPLANT
DRAPE U-SHAPE 47X51 STRL (DRAPES) ×9 IMPLANT
DRSG AQUACEL AG ADV 3.5X10 (GAUZE/BANDAGES/DRESSINGS) ×3 IMPLANT
DURAPREP 26ML APPLICATOR (WOUND CARE) ×3 IMPLANT
ELECT BLADE TIP CTD 4 INCH (ELECTRODE) ×3 IMPLANT
ELECT REM PT RETURN 9FT ADLT (ELECTROSURGICAL) ×3
ELECTRODE REM PT RTRN 9FT ADLT (ELECTROSURGICAL) ×1 IMPLANT
FACESHIELD WRAPAROUND (MASK) ×12 IMPLANT
GLOVE BIO SURGEON STRL SZ7.5 (GLOVE) ×3 IMPLANT
GLOVE BIOGEL PI IND STRL 7.5 (GLOVE) ×2 IMPLANT
GLOVE BIOGEL PI IND STRL 8.5 (GLOVE) ×1 IMPLANT
GLOVE BIOGEL PI INDICATOR 7.5 (GLOVE) ×4
GLOVE BIOGEL PI INDICATOR 8.5 (GLOVE) ×2
GLOVE ECLIPSE 8.0 STRL XLNG CF (GLOVE) ×6 IMPLANT
GLOVE ORTHO TXT STRL SZ7.5 (GLOVE) ×3 IMPLANT
GLOVE SURG SS PI 6.5 STRL IVOR (GLOVE) ×3 IMPLANT
GLOVE SURG SS PI 7.5 STRL IVOR (GLOVE) ×3 IMPLANT
GOWN SPEC L3 XXLG W/TWL (GOWN DISPOSABLE) ×3 IMPLANT
GOWN STRL REUS W/TWL LRG LVL3 (GOWN DISPOSABLE) ×3 IMPLANT
GOWN STRL REUS W/TWL XL LVL3 (GOWN DISPOSABLE) ×6 IMPLANT
HOLDER FOLEY CATH W/STRAP (MISCELLANEOUS) ×3 IMPLANT
KIT BASIN OR (CUSTOM PROCEDURE TRAY) ×3 IMPLANT
LIQUID BAND (GAUZE/BANDAGES/DRESSINGS) ×3 IMPLANT
NDL SAFETY ECLIPSE 18X1.5 (NEEDLE) ×1 IMPLANT
NEEDLE HYPO 18GX1.5 SHARP (NEEDLE) ×2
PACK TOTAL JOINT (CUSTOM PROCEDURE TRAY) ×3 IMPLANT
PEN SKIN MARKING BROAD (MISCELLANEOUS) ×3 IMPLANT
SAW OSC TIP CART 19.5X105X1.3 (SAW) ×3 IMPLANT
SUT MNCRL AB 4-0 PS2 18 (SUTURE) ×3 IMPLANT
SUT VIC AB 1 CT1 36 (SUTURE) ×9 IMPLANT
SUT VIC AB 2-0 CT1 27 (SUTURE) ×4
SUT VIC AB 2-0 CT1 TAPERPNT 27 (SUTURE) ×2 IMPLANT
SUT VLOC 180 0 24IN GS25 (SUTURE) ×3 IMPLANT
SYR 50ML LL SCALE MARK (SYRINGE) ×3 IMPLANT
TOWEL OR 17X26 10 PK STRL BLUE (TOWEL DISPOSABLE) ×3 IMPLANT
TOWEL OR NON WOVEN STRL DISP B (DISPOSABLE) ×3 IMPLANT
TRAY FOLEY CATH 14FRSI W/METER (CATHETERS) ×3 IMPLANT
WATER STERILE IRR 1500ML POUR (IV SOLUTION) ×3 IMPLANT
YANKAUER SUCT BULB TIP NO VENT (SUCTIONS) ×3 IMPLANT

## 2014-11-24 NOTE — Anesthesia Postprocedure Evaluation (Signed)
Anesthesia Post Note  Patient: Bianca White  Procedure(s) Performed: Procedure(s) (LRB): LEFT TOTAL HIP ARTHROPLASTY ANTERIOR APPROACH (Left)  Anesthesia type: MAC\SAB  Patient location: PACU  Post pain: Pain level controlled  Post assessment: Patient's Cardiovascular Status Stable  Last Vitals:  Filed Vitals:   11/24/14 1415  BP: 119/78  Pulse: 65  Temp:   Resp: 11    Post vital signs: Reviewed and stable  Level of consciousness: sedated  Complications: No apparent anesthesia complications

## 2014-11-24 NOTE — Plan of Care (Signed)
Problem: Consults Goal: Diagnosis- Total Joint Replacement Right anterior hip     

## 2014-11-24 NOTE — Anesthesia Preprocedure Evaluation (Addendum)
Anesthesia Evaluation    History of Anesthesia Complications Negative for: history of anesthetic complications  Airway        Dental   Pulmonary neg pulmonary ROS,          Cardiovascular hypertension, Pt. on home beta blockers and Pt. on medications + CAD, + Cardiac Stents and + Peripheral Vascular Disease     Neuro/Psych CVA negative psych ROS   GI/Hepatic negative GI ROS, Neg liver ROS,   Endo/Other  diabetes  Renal/GU Renal InsufficiencyRenal diseasenegative Renal ROS     Musculoskeletal   Abdominal   Peds  Hematology   Anesthesia Other Findings   Reproductive/Obstetrics                            Anesthesia Physical Anesthesia Plan  ASA: III  Anesthesia Plan: MAC and Spinal   Post-op Pain Management:    Induction: Intravenous  Airway Management Planned: Simple Face Mask  Additional Equipment:   Intra-op Plan:   Post-operative Plan:   Informed Consent:   Plan Discussed with:   Anesthesia Plan Comments:         Anesthesia Quick Evaluation

## 2014-11-24 NOTE — Progress Notes (Signed)
Utilization review completed.  

## 2014-11-24 NOTE — Interval H&P Note (Signed)
History and Physical Interval Note:  11/24/2014 10:16 AM  Bianca White  has presented today for surgery, with the diagnosis of LEFT HIP OA  The various methods of treatment have been discussed with the patient and family. After consideration of risks, benefits and other options for treatment, the patient has consented to  Procedure(s): LEFT TOTAL HIP ARTHROPLASTY ANTERIOR APPROACH (Left) as a surgical intervention .  The patient's history has been reviewed, patient examined, no change in status, stable for surgery.  I have reviewed the patient's chart and labs.  Questions were answered to the patient's satisfaction.     Mauri Pole

## 2014-11-24 NOTE — Anesthesia Procedure Notes (Addendum)
Spinal Patient location during procedure: OR Start time: 11/24/2014 11:28 AM End time: 11/24/2014 11:35 AM Staffing Anesthesiologist: Duane Boston Resident/CRNA: Danley Danker L Performed by: anesthesiologist and resident/CRNA  Preanesthetic Checklist Completed: patient identified, site marked, surgical consent, pre-op evaluation, timeout performed, IV checked, risks and benefits discussed and monitors and equipment checked Spinal Block Patient position: sitting Prep: Betadine Approach: midline Location: L3-4 Needle Needle type: Sprotte  Needle length: 9 cm Assessment Sensory level: T6 Additional Notes Kit expiration date 03/2016 and Kit lot # [id number] CSF clear, negative heme, negative paresthesia 2 attempts Tolerated well and returned to supine position

## 2014-11-24 NOTE — Op Note (Signed)
NAME:  Bianca White                ACCOUNT NO.: [id number]      MEDICAL RECORD NO.: [id number]      FACILITY:  [facility]      PHYSICIAN:  Paralee Cancel D  DATE OF BIRTH:  05-10-1946     DATE OF PROCEDURE:  11/24/2014                                 OPERATIVE REPORT         PREOPERATIVE DIAGNOSIS: Left  hip osteoarthritis.      POSTOPERATIVE DIAGNOSIS:  Left hip osteoarthritis.      PROCEDURE:  Left total hip replacement through an anterior approach   utilizing DePuy THR system, component size 41mm pinnacle cup, a size 32+4 neutral   Altrex liner, a size 2 Hi Tri Lock stem with a 32+1 delta ceramic   ball.      SURGEON:  Pietro Cassis. Alvan Dame, M.D.      ASSISTANT:  Nehemiah Massed, PA-C     ANESTHESIA:  Spinal.      SPECIMENS:  None.      COMPLICATIONS:  None.      BLOOD LOSS:  350 cc     DRAINS:  None.      INDICATION OF THE PROCEDURE:  Bianca White is a 69 y.o. female who had   presented to office for evaluation of left hip pain.  Radiographs revealed   progressive degenerative changes with bone-on-bone   articulation to the  hip joint.  The patient had painful limited range of   motion significantly affecting their overall quality of life.  The patient was failing to    respond to conservative measures, and at this point was ready   to proceed with more definitive measures.  The patient has noted progressive   degenerative changes in his hip, progressive problems and dysfunction   with regarding the hip prior to surgery.  Consent was obtained for   benefit of pain relief.  Specific risk of infection, DVT, component   failure, dislocation, need for revision surgery, as well discussion of   the anterior versus posterior approach were reviewed.  Consent was   obtained for benefit of anterior pain relief through an anterior   approach.      PROCEDURE IN DETAIL:  The patient was brought to operative theater.   Once adequate anesthesia, preoperative  antibiotics, 2gm of Ancef, 10mg  of Decadron administered.   The patient was positioned supine on the OSI Hanna table.  Once adequate   padding of boney process was carried out, we had predraped out the hip, and  used fluoroscopy to confirm orientation of the pelvis and position.      The left hip was then prepped and draped from proximal iliac crest to   mid thigh with shower curtain technique.      Time-out was performed identifying the patient, planned procedure, and   extremity.     An incision was then made 2 cm distal and lateral to the   anterior superior iliac spine extending over the orientation of the   tensor fascia lata muscle and sharp dissection was carried down to the   fascia of the muscle and protractor placed in the soft tissues.      The fascia was then incised.  The muscle belly was  identified and swept   laterally and retractor placed along the superior neck.  Following   cauterization of the circumflex vessels and removing some pericapsular   fat, a second cobra retractor was placed on the inferior neck.  A third   retractor was placed on the anterior acetabulum after elevating the   anterior rectus.  A L-capsulotomy was along the line of the   superior neck to the trochanteric fossa, then extended proximally and   distally.  Tag sutures were placed and the retractors were then placed   intracapsular.  We then identified the trochanteric fossa and   orientation of my neck cut, confirmed this radiographically   and then made a neck osteotomy with the femur on traction.  The femoral   head was removed without difficulty or complication.  Traction was let   off and retractors were placed posterior and anterior around the   acetabulum.      The labrum and foveal tissue were debrided.  I began reaming with a 64mm   reamer and reamed up to 9mm reamer with good bony bed preparation and a 42mm   cup was chosen.  The final 106mm Pinnacle cup was then impacted under  fluoroscopy  to confirm the depth of penetration and orientation with respect to   abduction.  A screw was placed followed by the hole eliminator.  The final   32+4 neutral Altrex liner was impacted with good visualized rim fit.  The cup was positioned anatomically within the acetabular portion of the pelvis.      At this point, the femur was rolled at 80 degrees.  Further capsule was   released off the inferior aspect of the femoral neck.  I then   released the superior capsule proximally.  The hook was placed laterally   along the femur and elevated manually and held in position with the bed   hook.  The leg was then extended and adducted with the leg rolled to 100   degrees of external rotation.  Once the proximal femur was fully   exposed, I used a box osteotome to set orientation.  I then began   broaching with the starting chili pepper broach and passed this by hand and then broached up to 2.  With the 2 broach in place I chose a high offset neck and did a trial reduction.  The offset was appropriate, leg lengths   appeared to be equal, confirmed radiographically.   Given these findings, I went ahead and dislocated the hip, repositioned all   retractors and positioned the right hip in the extended and abducted position.  The final 2 Hi Tri Lock stem was   chosen and it was impacted down to the level of neck cut.  Based on this   and the trial reduction, a 32+1 delta ceramic ball was chosen and   impacted onto a clean and dry trunnion, and the hip was reduced.  The   hip had been irrigated throughout the case again at this point.  I did   reapproximate the superior capsular leaflet to the anterior leaflet   using #1 Vicryl.  The fascia of the   tensor fascia lata muscle was then reapproximated using #1 Vicryl.  The 2gm of Tranexamic Acid was then injected into the submuscular region of the hip joint.  The   remaining wound was closed with 2-0 Vicryl and running 4-0 Monocryl.   The hip  was cleaned, dried, and dressed  sterilely using Dermabond and   Aquacel dressing.  She was then brought   to recovery room in stable condition tolerating the procedure well.    Nehemiah Massed, PA-C was present for the entirety of the case involved from   preoperative positioning, perioperative retractor management, general   facilitation of the case, as well as primary wound closure as assistant.            Pietro Cassis Alvan Dame, M.D.        11/24/2014 1:14 PM

## 2014-11-24 NOTE — Progress Notes (Signed)
Portable AP Pelvis and Lateral Left Hip X-rays done. 

## 2014-11-24 NOTE — Progress Notes (Signed)
X-ray results noted 

## 2014-11-24 NOTE — Progress Notes (Addendum)
ANTICOAGULATION CONSULT NOTE - Initial Consult  Pharmacy Consult for Warfarin Indication: atrial fibrillation  Allergies  Allergen Reactions  . Dilaudid [Hydromorphone Hcl]     Pt unsure   . Stadol [Butorphanol]     Went crazy    Patient Measurements: Height: 5\' 1"  (154.9 cm) Weight: 219 lb (99.338 kg) IBW/kg (Calculated) : 47.8  Vital Signs: Temp: 97.5 F (36.4 C) (03/15 1515) Temp Source: Oral (03/15 0831) BP: 129/63 mmHg (03/15 1515) Pulse Rate: 83 (03/15 1515)  Labs:  Recent Labs  11/24/14 1000  LABPROT 15.4*  INR 1.20    Estimated Creatinine Clearance: 49.7 mL/min (by C-G formula based on Cr of 1.12).   Medical History: Past Medical History  Diagnosis Date  . Type 2 diabetes mellitus   . Essential hypertension, benign   . CVA (cerebral infarction)   . Paroxysmal atrial fibrillation     Detected on tele [facility] 3/14   . Hyperlipidemia   . Carotid artery disease     Nonobstructive; < 50% bilateral ICA stenosis, 07/2007.  Marland Kitchen Coronary atherosclerosis of native coronary artery     75% distal LAD, 75% diagonal, DES to mid RCA - March 2014  . Stroke   . Skin abnormality     right wrist rea scab area    Medications:  Scheduled:  . [START ON 11/25/2014] amLODipine  5 mg Oral Daily  . atenolol  25 mg Oral BID  .  ceFAZolin (ANCEF) IV  2 g Intravenous Q6H  . celecoxib  200 mg Oral Q12H  . [START ON 11/25/2014] dexamethasone  10 mg Intravenous Once  . docusate sodium  100 mg Oral BID  . [START ON 11/25/2014] enoxaparin (LOVENOX) injection  40 mg Subcutaneous Q24H  . fentaNYL      . ferrous sulfate  325 mg Oral TID PC  . [START ON 11/25/2014] glipiZIDE  5 mg Oral QAC breakfast  . HYDROcodone-acetaminophen  1-2 tablet Oral Q4H  . insulin aspart  0-15 Units Subcutaneous TID WC  . [START ON 11/25/2014] linagliptin  5 mg Oral Q breakfast   And  . metFORMIN  500 mg Oral BID WC  . polyethylene glycol  17 g Oral BID  . promethazine      . simvastatin  10 mg Oral QHS   . sodium chloride      . Warfarin - Pharmacist Dosing Inpatient   Does not apply q1800   Infusions:  . sodium chloride 0.9 % 1,000 mL with potassium chloride 10 mEq infusion      Assessment:  69 yr female with left hip OA and pain.  H/O CVA, CAD, DM and PAF (on warfarin PTA).  Underwent THA today and warfarin per pharmacy dosing ordered to resume today  PTA pt on warfarin 5mg  daily except 2.5mg  on Tu/Th/Sat  INR = 1.85 on 3/9 (last day warfarin taken prior to surgery)  INR = 1.2 (3/15)  Goal of Therapy:  INR 2-3   Plan:   Warfarin 7.5 mg po x 1 tonight (1.5 x home dose per protocol as restart warfarin after being held x 6 days)  Pt also on Lovenox 40mg  daily per MD to begin 3/16  Check daily PT/INR  Casper Pagliuca, Toribio Harbour, PharmD 11/24/2014,4:06 PM

## 2014-11-24 NOTE — Transfer of Care (Signed)
Immediate Anesthesia Transfer of Care Note  Patient: Bianca White  Procedure(s) Performed: Procedure(s): LEFT TOTAL HIP ARTHROPLASTY ANTERIOR APPROACH (Left)  Patient Location: PACU  Anesthesia Type:Spinal  Level of Consciousness: awake  Airway & Oxygen Therapy: Patient Spontanous Breathing and Patient connected to face mask oxygen  Post-op Assessment: Report given to RN and Post -op Vital signs reviewed and stable  Post vital signs: Reviewed and stable  Last Vitals:  Filed Vitals:   11/24/14 0831  BP: 136/69  Pulse: 56  Temp: 36.4 C  Resp: 20    Complications: No apparent anesthesia complications

## 2014-11-25 LAB — CBC
HCT: 31.9 % — ABNORMAL LOW (ref 36.0–46.0)
Hemoglobin: 10.4 g/dL — ABNORMAL LOW (ref 12.0–15.0)
MCH: 29.4 pg (ref 26.0–34.0)
MCHC: 32.6 g/dL (ref 30.0–36.0)
MCV: 90.1 fL (ref 78.0–100.0)
PLATELETS: 220 10*3/uL (ref 150–400)
RBC: 3.54 MIL/uL — AB (ref 3.87–5.11)
RDW: 14.2 % (ref 11.5–15.5)
WBC: 10.1 10*3/uL (ref 4.0–10.5)

## 2014-11-25 LAB — PROTIME-INR
INR: 1.27 (ref 0.00–1.49)
Prothrombin Time: 16 seconds — ABNORMAL HIGH (ref 11.6–15.2)

## 2014-11-25 LAB — GLUCOSE, CAPILLARY
GLUCOSE-CAPILLARY: 302 mg/dL — AB (ref 70–99)
Glucose-Capillary: 233 mg/dL — ABNORMAL HIGH (ref 70–99)
Glucose-Capillary: 211 mg/dL — ABNORMAL HIGH (ref 70–99)
Glucose-Capillary: 156 mg/dL — ABNORMAL HIGH (ref 70–99)
Glucose-Capillary: 183 mg/dL — ABNORMAL HIGH (ref 70–99)

## 2014-11-25 LAB — TYPE AND SCREEN
ABO/RH(D): AB POS
ANTIBODY SCREEN: NEGATIVE

## 2014-11-25 LAB — BASIC METABOLIC PANEL
Anion gap: 15 (ref 5–15)
BUN: 28 mg/dL — ABNORMAL HIGH (ref 6–23)
CALCIUM: 8.6 mg/dL (ref 8.4–10.5)
CO2: 23 mmol/L (ref 19–32)
CREATININE: 1.33 mg/dL — AB (ref 0.50–1.10)
Chloride: 103 mmol/L (ref 96–112)
GFR calc Af Amer: 45 mL/min — ABNORMAL LOW (ref >90)
GFR calc non Af Amer: 39 mL/min — ABNORMAL LOW (ref >90)
GLUCOSE: 261 mg/dL — AB (ref 70–99)
Potassium: 4.9 mmol/L (ref 3.5–5.1)
Sodium: 141 mmol/L (ref 135–145)

## 2014-11-25 MED ORDER — WARFARIN SODIUM 5 MG PO TABS
5.0000 mg | ORAL_TABLET | Freq: Once | ORAL | Status: AC
  Administered 2014-11-25: 5 mg via ORAL
  Filled 2014-11-25: qty 1

## 2014-11-25 NOTE — Progress Notes (Signed)
ANTICOAGULATION CONSULT NOTE - Follow up  Pharmacy Consult for Warfarin Indication: atrial fibrillation  Allergies  Allergen Reactions  . Dilaudid [Hydromorphone Hcl]     Pt unsure   . Stadol [Butorphanol]     Went crazy    Patient Measurements: Height: 5\' 1"  (154.9 cm) Weight: 219 lb (99.338 kg) IBW/kg (Calculated) : 47.8  Vital Signs: Temp: 98 F (36.7 C) (03/16 0654) Temp Source: Oral (03/16 0654) BP: 100/48 mmHg (03/16 0654) Pulse Rate: 59 (03/16 0654)  Labs:  Recent Labs  11/24/14 1000 11/24/14 1640 11/25/14 0518  HGB  --  12.0 10.4*  HCT  --  36.8 31.9*  PLT  --  246 220  LABPROT 15.4*  --  16.0*  INR 1.20  --  1.27  CREATININE  --  1.07 1.33*    Estimated Creatinine Clearance: 41.9 mL/min (by C-G formula based on Cr of 1.33).   Medical History: Past Medical History  Diagnosis Date  . Type 2 diabetes mellitus   . Essential hypertension, benign   . CVA (cerebral infarction)   . Paroxysmal atrial fibrillation     Detected on tele [facility] 3/14   . Hyperlipidemia   . Carotid artery disease     Nonobstructive; < 50% bilateral ICA stenosis, 07/2007.  Marland Kitchen Coronary atherosclerosis of native coronary artery     75% distal LAD, 75% diagonal, DES to mid RCA - March 2014  . Stroke   . Skin abnormality     right wrist rea scab area    Medications:  Scheduled:  . amLODipine  5 mg Oral Daily  . atenolol  25 mg Oral BID  . celecoxib  200 mg Oral Q12H  . dexamethasone  10 mg Intravenous Once  . docusate sodium  100 mg Oral BID  . enoxaparin (LOVENOX) injection  40 mg Subcutaneous Q24H  . ferrous sulfate  325 mg Oral TID PC  . glipiZIDE  5 mg Oral QAC breakfast  . HYDROcodone-acetaminophen  1-2 tablet Oral Q4H  . insulin aspart  0-15 Units Subcutaneous TID WC  . linagliptin  5 mg Oral Q breakfast   And  . metFORMIN  500 mg Oral BID WC  . polyethylene glycol  17 g Oral BID  . simvastatin  10 mg Oral QHS  . Warfarin - Pharmacist Dosing Inpatient   Does not  apply q1800   Infusions:  . sodium chloride 0.9 % 1,000 mL with potassium chloride 10 mEq infusion 100 mL/hr (11/25/14 0455)    Assessment: 69 yr female with left hip OA and pain.  H/O CVA, CAD, DM and PAF (on warfarin PTA).  Underwent THA 3/15 and warfarin per pharmacy dosing ordered to resume post-op. PTA on warfarin 5mg  daily except 2.5mg  on Tu/Th/Sat  3/15: INR = 1.2, Warfarin 7.5mg .  Today, 11/25/2014:  INR up slightly after first dose.  CBC ok, post-op anemia. No bleeding reported/documented.  Tolerating diet.  Goal of Therapy:  INR 2-3   Plan:   Give warfarin 5 mg po x 1 today.   Cont Lovenox 40mg  daily per MD.  Check daily PT/INR.  Romeo Rabon, PharmD, pager [phone]. 11/25/2014,8:52 AM.

## 2014-11-25 NOTE — Care Management Note (Addendum)
    Page 1 of 2   11/27/2014     10:51:39 AM CARE MANAGEMENT NOTE 11/27/2014  Patient:  Bell,Keyshia A   Account Number:  [id number]  Date Initiated:  11/25/2014  Documentation initiated by:  [facility]  Subjective/Objective Assessment:   adm: LEFT TOTAL HIP ARTHROPLASTY ANTERIOR APPROACH (Left)     Action/Plan:   discharge planning   Anticipated DC Date:  11/26/2014   Anticipated DC Plan:        [place]  CM consult      [facility] Choice  HOME HEALTH   Choice offered to / List presented to:  C-1 Patient   DME arranged  3-N-1  Vassie Moselle      DME agency  [place] arranged  HH-1 RN  [place] agency  [facility]   Status of service:  Completed, signed off Medicare Important Message given?  YES (If response is "NO", the following Medicare IM given date fields will be blank) Date Medicare IM given:  11/27/2014 Medicare IM given by:  [facility] Date Additional Medicare IM given:   Additional Medicare IM given by:    Discharge Disposition:  [place]  Per UR Regulation:  Reviewed for med. necessity/level of care/duration of stay  If discussed at [place] of Stay Meetings, dates discussed:    Comments:  11-27-14 [place] Per [place] Go, they have coverage in the area where patient lives and can start care tomorrow. Patient made aware and is in agreement with plan.  11/26/14 16:00 CM explained to pt  the only [facility] agency with staffing is [facility] and they cannot have [place] until Monday 11/30/14.  RN requesting orders for HHPT/RN with [facility] on Monday. CM called [facility] DME rep, Lecretia to please deliver 3n1 an rolling walker to room prior to discharge.   If orders are secured, RN faxing to [facility] (RN has packet of orders). Mariane Masters, BSN, Jearld Lesch [phone].  11/25/14 13:00 CM met with pt in room to discuss disposition.  At this time, it has not been decided if pt will go  to SNF or home. CM will continue to monitor pt's progress for discharge planning. Mariane Masters, BSN, CM [phone].

## 2014-11-25 NOTE — Progress Notes (Signed)
OT Cancellation Note  Patient Details Name: Bianca White MRN: [id number] DOB: [birth year]   Cancelled Treatment:    Reason Eval/Treat Not Completed: Other (comment) Pt eating lunch. OT role explained. Will check back on pt later this day or next day for OT eval . Pt pondering HH or SNF [place], Thereasa Parkin 11/25/2014, 12:20 PM

## 2014-11-25 NOTE — Evaluation (Signed)
Physical Therapy Evaluation Patient Details Name: Bianca White MRN: [id number] DOB: 1945-10-14 Today's Date: 11/25/2014   History of Present Illness  L THR  Clinical Impression  Pt s/p L THR presents with decreased L LE strength/ROM, post op pain and obesity limiting functional mobility.  Pt hopes to progress to d/c home but may need SNF level follow up dependent on acute stay progress and level of assist pt can arrange at home.    Follow Up Recommendations Home health PT;SNF    Equipment Recommendations  Rolling walker with 5" wheels    Recommendations for Other Services OT consult     Precautions / Restrictions Precautions Precautions: Fall Restrictions Weight Bearing Restrictions: No Other Position/Activity Restrictions: WBAT      Mobility  Bed Mobility Overal bed mobility: +2 for physical assistance;Needs Assistance Bed Mobility: Supine to Sit     Supine to sit: Mod assist;+2 for physical assistance     General bed mobility comments: cues for sequence and use of R LE to self assist  Transfers Overall transfer level: Needs assistance Equipment used: Rolling walker (2 wheeled) Transfers: Sit to/from Stand Sit to Stand: From elevated surface;+2 safety/equipment;+2 physical assistance;Mod assist         General transfer comment: cues for LE management and use of UEs to self assist  Ambulation/Gait Ambulation/Gait assistance: Min assist;Mod assist;+2 safety/equipment Ambulation Distance (Feet): 30 Feet Assistive device: Rolling walker (2 wheeled) Gait Pattern/deviations: Step-to pattern;Decreased step length - right;Decreased step length - left;Shuffle;Trunk flexed Gait velocity: decr   General Gait Details: cues for sequence, posture and position from [employer]            Wheelchair Mobility    Modified Rankin (Stroke Patients Only)       Balance                                             Pertinent Vitals/Pain Pain  Assessment: 0-10 Pain Score: 4  Pain Location: L hip Pain Descriptors / Indicators: Aching;Burning Pain Intervention(s): Limited activity within patient's tolerance;Monitored during session;Premedicated before session;Ice applied    Home Living Family/patient expects to be discharged to:: Private residence Living Arrangements: Spouse/significant other;Children Available Help at Discharge: Family Type of Home: House Home Access: Stairs to enter Entrance Stairs-Rails: None Technical brewer of Steps: 1 Home Layout: One level Home Equipment: Cane - single point Additional Comments: Pt states son and husband are attempting to arrange days off to assist with care at point of dc home    Prior Function Level of Independence: Independent with assistive device(s)               Hand Dominance        Extremity/Trunk Assessment   Upper Extremity Assessment: Generalized weakness           Lower Extremity Assessment: RLE deficits/detail;LLE deficits/detail RLE Deficits / Details: generalized weakness - pt states since CVA LLE Deficits / Details: hip strength 3-/5 with AAROM at hi to 75 flex and 20 abd  Cervical / Trunk Assessment: Normal  Communication   Communication: No difficulties  Cognition Arousal/Alertness: Awake/alert Behavior During Therapy: WFL for tasks assessed/performed Overall Cognitive Status: Within Functional Limits for tasks assessed                      General Comments      Exercises  Total Joint Exercises Ankle Circles/Pumps: AROM;Both;15 reps;Supine Quad Sets: AROM;Both;10 reps;Supine Heel Slides: AAROM;Left;20 reps;Supine Hip ABduction/ADduction: AAROM;Left;15 reps;Supine      Assessment/Plan    PT Assessment Patient needs continued PT services  PT Diagnosis Difficulty walking   PT Problem List Decreased strength;Decreased range of motion;Decreased activity tolerance;Decreased mobility;Decreased knowledge of use of  DME;Pain;Obesity  PT Treatment Interventions DME instruction;Gait training;Stair training;Functional mobility training;Therapeutic activities;Therapeutic exercise;Patient/family education   PT Goals (Current goals can be found in the Care Plan section) Acute Rehab PT Goals Patient Stated Goal: HOME PT Goal Formulation: With patient Time For Goal Achievement: 12/02/14 Potential to Achieve Goals: Fair    Frequency 7X/week   Barriers to discharge        Co-evaluation               End of Session Equipment Utilized During Treatment: Gait belt Activity Tolerance: Patient tolerated treatment well Patient left: in chair;with call bell/phone within reach;with family/visitor present Nurse Communication: Mobility status         Time: [id number] PT Time Calculation (min) (ACUTE ONLY): 33 min   Charges:   PT Evaluation $Initial PT Evaluation Tier I: 1 Procedure PT Treatments $Therapeutic Exercise: 8-22 mins   PT G Codes:        Ashna Dorough 02-Dec-2014, 12:35 PM

## 2014-11-25 NOTE — Progress Notes (Signed)
Clinical Social Work Department BRIEF PSYCHOSOCIAL ASSESSMENT 11/25/2014  Patient:  Bianca White, Bianca White     Account Number:  [id number]     [place] date:  11/24/2014  Clinical Social Worker:  Lacie Scotts  Date/Time:  11/25/2014 01:12 PM  Referred by:  Physician  Date Referred:  11/25/2014 Referred for  SNF Placement   Other Referral:   Interview type:  Patient Other interview type:    PSYCHOSOCIAL DATA Living Status:  ALONE Admitted from facility:   Level of care:   Primary support name:  Kearney Hard Primary support relationship to patient:  CHILD, ADULT Degree of support available:   supportive    CURRENT CONCERNS Current Concerns  Post-Acute Placement   Other Concerns:    SOCIAL WORK ASSESSMENT / PLAN Pt is a 68 yr. old female living at home prior to hospitalization. CSW met with pt to assist with d/c planning. This is a planned admission. Pt is hoping to return home following hospital d/c. She is willing to consider ST Rehab if needed. PT is recommending home vs SNF depending on progress.CSW will continue to follow until d/c plans are finalized.   Assessment/plan status:  Psychosocial Support/Ongoing Assessment of Needs Other assessment/ plan:   Information/referral to community resources:   Insurance coverage for SNF and ambulance transport reviewed.    PATIENT'S/FAMILY'S RESPONSE TO PLAN OF CARE: Pt's mood is bright. Pain is being controlled. " I want to go home and have my therapy, if I can. My family will help me. I'm just not sure yet if I'll be able to manage. ". Pt is motivated to work with therapy.    Werner Lean LCSW [phone]

## 2014-11-25 NOTE — Progress Notes (Signed)
Physical Therapy Treatment Patient Details Name: TIASIA TYREE MRN: [id number] DOB: [birth year] Today's Date: Dec 22, 2014    History of Present Illness L THR    PT Comments    Pt motivated and progressing with mobility.  Pt hoping for dc home Friday.  Follow Up Recommendations  Home health PT;SNF     Equipment Recommendations  Rolling walker with 5" wheels    Recommendations for Other Services OT consult     Precautions / Restrictions Precautions Precautions: Fall Restrictions Weight Bearing Restrictions: No Other Position/Activity Restrictions: WBAT    Mobility  Bed Mobility Overal bed mobility: Needs Assistance Bed Mobility: Sit to Supine       Sit to supine: Mod assist   General bed mobility comments: cues for sequence and use of R LE to self assist  Transfers Overall transfer level: Needs assistance Equipment used: Rolling walker (2 wheeled) Transfers: Sit to/from Stand Sit to Stand: Min assist;Mod assist         General transfer comment: cues for LE management and use of UEs to self assist  Ambulation/Gait Ambulation/Gait assistance: Min assist Ambulation Distance (Feet): 95 Feet Assistive device: Rolling walker (2 wheeled) Gait Pattern/deviations: Step-to pattern;Decreased step length - right;Decreased step length - left;Shuffle;Trunk flexed Gait velocity: decr   General Gait Details: cues for sequence, posture and position from [employer]            Wheelchair Mobility    Modified Rankin (Stroke Patients Only)       Balance                                    Cognition Arousal/Alertness: Awake/alert Behavior During Therapy: WFL for tasks assessed/performed Overall Cognitive Status: Within Functional Limits for tasks assessed                      Exercises Total Joint Exercises Ankle Circles/Pumps: AROM;Both;15 reps;Supine Quad Sets: AROM;Both;10 reps;Supine Heel Slides: AAROM;Left;20  reps;Supine Hip ABduction/ADduction: AAROM;Left;15 reps;Supine    General Comments        Pertinent Vitals/Pain Pain Assessment: 0-10 Pain Score: 4  Pain Location: L hip Pain Descriptors / Indicators: Aching;Sore Pain Intervention(s): Limited activity within patient's tolerance;Monitored during session;Premedicated before session;Ice applied    Home Living                      Prior Function            PT Goals (current goals can now be found in the care plan section) Acute Rehab PT Goals Patient Stated Goal: HOME PT Goal Formulation: With patient Time For Goal Achievement: 12/02/14 Potential to Achieve Goals: Fair Progress towards PT goals: Progressing toward goals    Frequency  7X/week    PT Plan Current plan remains appropriate    Co-evaluation             End of Session Equipment Utilized During Treatment: Gait belt Activity Tolerance: Patient tolerated treatment well Patient left: in bed;with call bell/phone within reach;with family/visitor present     Time: [id number] PT Time Calculation (min) (ACUTE ONLY): 31 min  Charges:  $Gait Training: 8-22 mins $Therapeutic Exercise: 8-22 mins                    G Codes:      Aribelle Mccosh 2014/12/22, 2:24 PM

## 2014-11-25 NOTE — Progress Notes (Signed)
     Subjective: 1 Day Post-Op Procedure(s) (LRB): LEFT TOTAL HIP ARTHROPLASTY ANTERIOR APPROACH (Left)   Patient reports pain as mild, pain controlled. She hasn't been up on the hip yet, but looking forward to working with PT.  Will decide on home vs SNF depending on her progress with PT.  Objective:   VITALS:   Filed Vitals:   11/25/14 0654  BP: 100/48  Pulse: 59  Temp: 98 F (36.7 C)  Resp: 16    Dorsiflexion/Plantar flexion intact Incision: dressing C/D/I No cellulitis present Compartment soft  LABS  Recent Labs  11/24/14 1640 11/25/14 0518  HGB 12.0 10.4*  HCT 36.8 31.9*  WBC 15.3* 10.1  PLT 246 220     Recent Labs  11/24/14 1640 11/25/14 0518  NA  --  141  K  --  4.9  BUN  --  28*  CREATININE 1.07 1.33*  GLUCOSE  --  261*     Assessment/Plan: 1 Day Post-Op Procedure(s) (LRB): LEFT TOTAL HIP ARTHROPLASTY ANTERIOR APPROACH (Left) Foley cath d/c'ed Advance diet Up with therapy D/C IV fluids Discharge home with home health vs SNF  Morbid Obesity (BMI >40)  Estimated body mass index is 41.4 kg/(m^2) as calculated from the following:   Height as of this encounter: 5\' 1"  (1.549 m).   Weight as of this encounter: 99.338 kg (219 lb). Patient also counseled that weight may inhibit the healing process Patient counseled that losing weight will help with future health issues        West Pugh. Becker Christopher   PAC  11/25/2014, 10:41 AM

## 2014-11-26 LAB — CBC
HCT: 29.7 % — ABNORMAL LOW (ref 36.0–46.0)
Hemoglobin: 9.5 g/dL — ABNORMAL LOW (ref 12.0–15.0)
MCH: 29.2 pg (ref 26.0–34.0)
MCHC: 32 g/dL (ref 30.0–36.0)
MCV: 91.4 fL (ref 78.0–100.0)
Platelets: 211 10*3/uL (ref 150–400)
RBC: 3.25 MIL/uL — ABNORMAL LOW (ref 3.87–5.11)
RDW: 14.6 % (ref 11.5–15.5)
WBC: 9.6 10*3/uL (ref 4.0–10.5)

## 2014-11-26 LAB — BASIC METABOLIC PANEL
Anion gap: 16 — ABNORMAL HIGH (ref 5–15)
BUN: 40 mg/dL — AB (ref 6–23)
CALCIUM: 8.1 mg/dL — AB (ref 8.4–10.5)
CO2: 21 mmol/L (ref 19–32)
Chloride: 102 mmol/L (ref 96–112)
Creatinine, Ser: 1.53 mg/dL — ABNORMAL HIGH (ref 0.50–1.10)
GFR, EST AFRICAN AMERICAN: 38 mL/min — AB (ref >90)
GFR, EST NON AFRICAN AMERICAN: 33 mL/min — AB (ref >90)
Glucose, Bld: 194 mg/dL — ABNORMAL HIGH (ref 70–99)
POTASSIUM: 4.6 mmol/L (ref 3.5–5.1)
SODIUM: 139 mmol/L (ref 135–145)

## 2014-11-26 LAB — PROTIME-INR
INR: 1.57 — ABNORMAL HIGH (ref 0.00–1.49)
PROTHROMBIN TIME: 18.9 s — AB (ref 11.6–15.2)

## 2014-11-26 LAB — GLUCOSE, CAPILLARY
GLUCOSE-CAPILLARY: 153 mg/dL — AB (ref 70–99)
Glucose-Capillary: 162 mg/dL — ABNORMAL HIGH (ref 70–99)
Glucose-Capillary: 121 mg/dL — ABNORMAL HIGH (ref 70–99)
Glucose-Capillary: 176 mg/dL — ABNORMAL HIGH (ref 70–99)

## 2014-11-26 MED ORDER — WARFARIN SODIUM 5 MG PO TABS
5.0000 mg | ORAL_TABLET | Freq: Once | ORAL | Status: AC
  Administered 2014-11-26: 5 mg via ORAL
  Filled 2014-11-26: qty 1

## 2014-11-26 NOTE — Clinical Documentation Improvement (Signed)
Supporting Information: Patient with a history of normocytic anemia per 3/04 H&P.   Labs:  H/H: 3/17:   9.5/29.7. 3/16:  10.4/31.9.  3/15: 12.0/36.8.     Possible Clinical Condition: . Documentation of Anemia should include the type of anemia: --Nutritional --Acute Blood Loss Anemia on Normocytic Anemia --Acute Blood Loss Anemia on Chronic Blood Loss Anemia --Acute Blood Loss Anemia --Other (please specify) . Include in documentation if Anemia is due to nutrition or mineral deficits, resulting in a nutritional anemia . Document if the Anemia is due to a neoplasm (primary and/or secondary) . Document whether the ANEMIA is "related to or due to" chemo or radiotherapy treatments . Document any "cause-and-effect" relationship between the intervention and the blood or immune disorder . Document the specific drug if anemia is drug-induced . Link any laboratory findings to a related diagnosis (if appropriate) . Document any associated diagnoses/conditions     Thank Sherian Maroon Documentation Specialist [phone] [email or web address]

## 2014-11-26 NOTE — Progress Notes (Signed)
Physical Therapy Treatment Patient Details Name: Bianca White MRN: [id number] DOB: 1946/03/30 Today's Date: 11/26/2014    History of Present Illness L THR    PT Comments    Steady progress with mobility but continues to struggle with in/out bed.  Pt states she has slept in a recliner for months 2* same issue at home  Follow Up Recommendations  Home health PT;SNF     Equipment Recommendations  Rolling walker with 5" wheels    Recommendations for Other Services OT consult     Precautions / Restrictions Precautions Precautions: Fall Restrictions Weight Bearing Restrictions: No Other Position/Activity Restrictions: WBAT    Mobility  Bed Mobility Overal bed mobility: Needs Assistance Bed Mobility: Supine to Sit     Supine to sit: Mod assist     General bed mobility comments: pt in chair  Transfers Overall transfer level: Needs assistance Equipment used: Rolling walker (2 wheeled) Transfers: Sit to/from Stand Sit to Stand: Min guard         General transfer comment: cues for LE management and use of UEs to self assist  Ambulation/Gait Ambulation/Gait assistance: Min assist Ambulation Distance (Feet): 180 Feet Assistive device: Rolling walker (2 wheeled) Gait Pattern/deviations: Step-to pattern;Step-through pattern;Decreased step length - right;Decreased step length - left;Shuffle;Trunk flexed Gait velocity: decr   General Gait Details: cues for sequence, posture and position from [employer]            Wheelchair Mobility    Modified Rankin (Stroke Patients Only)       Balance                                    Cognition Arousal/Alertness: Awake/alert Behavior During Therapy: WFL for tasks assessed/performed Overall Cognitive Status: Within Functional Limits for tasks assessed                      Exercises Total Joint Exercises Ankle Circles/Pumps: AROM;Both;15 reps;Supine Quad Sets: AROM;Both;10  reps;Supine Gluteal Sets: AROM;Both;10 reps;Supine Heel Slides: AAROM;Left;20 reps;Supine Hip ABduction/ADduction: AAROM;Left;15 reps;Supine    General Comments        Pertinent Vitals/Pain Pain Assessment: 0-10 Pain Score: 4  Pain Location: l hip Pain Descriptors / Indicators: Sore Pain Intervention(s): Limited activity within patient's tolerance;Monitored during session;Repositioned    Home Living Family/patient expects to be discharged to:: Private residence Living Arrangements: Spouse/significant other;Children Available Help at Discharge: Family Type of Home: House Home Access: Stairs to enter Entrance Stairs-Rails: None Home Layout: One level Home Equipment: Cane - single point Additional Comments: Pt states son and husband are attempting to arrange days off to assist with care at point of dc home    Prior Function Level of Independence: Independent with assistive device(s)          PT Goals (current goals can now be found in the care plan section) Acute Rehab PT Goals Patient Stated Goal: HOME PT Goal Formulation: With patient Time For Goal Achievement: 12/02/14 Potential to Achieve Goals: Fair Progress towards PT goals: Progressing toward goals    Frequency  7X/week    PT Plan Current plan remains appropriate    Co-evaluation             End of Session Equipment Utilized During Treatment: Gait belt Activity Tolerance: Patient tolerated treatment well Patient left: in chair;with call bell/phone within reach;with family/visitor present     Time: [id number] PT Time  Calculation (min) (ACUTE ONLY): 40 min  Charges:  $Gait Training: 8-22 mins $Therapeutic Exercise: 8-22 mins $Therapeutic Activity: 8-22 mins                    G Codes:      Bianca White 12/07/2014, 11:59 AM

## 2014-11-26 NOTE — Evaluation (Signed)
Occupational Therapy Evaluation Patient Details Name: Bianca White MRN: [id number] DOB: Jan 21, 1946 Today's Date: 11/26/2014    History of Present Illness L THR   Clinical Impression   Pt is s/p THA resulting in the deficits listed below (see OT Problem List).  Pt will benefit from skilled OT to increase their safety and independence with ADL and functional mobility for ADL to facilitate discharge to venue listed below.        Follow Up Recommendations  Home health OT;Supervision - Intermittent    Equipment Recommendations  None recommended by OT    Recommendations for Other Services       Precautions / Restrictions Precautions Precautions: Fall Restrictions Weight Bearing Restrictions: No Other Position/Activity Restrictions: WBAT      Mobility Bed Mobility               General bed mobility comments: pt in chair  Transfers Overall transfer level: Needs assistance Equipment used: Rolling walker (2 wheeled) Transfers: Sit to/from Stand Sit to Stand: Min guard                   ADL Overall ADL's : Needs assistance/impaired     Grooming: Wash/dry face;Oral care;Sitting;Set up   Upper Body Bathing: Set up;Sitting   Lower Body Bathing: Moderate assistance;Sit to/from stand   Upper Body Dressing : Set up;Sitting   Lower Body Dressing: Moderate assistance;Sit to/from stand   Toilet Transfer: Min guard;Comfort height toilet;Ambulation;RW   Toileting- Water quality scientist and Hygiene: Min guard;Sit to/from stand                         Pertinent Vitals/Pain Pain Score: 4  Pain Location: l hip Pain Descriptors / Indicators: Sore Pain Intervention(s): Limited activity within patient's tolerance;Monitored during session;Repositioned     Hand Dominance     Extremity/Trunk Assessment Upper Extremity Assessment Upper Extremity Assessment: Generalized weakness           Communication Communication Communication: No  difficulties   Cognition Arousal/Alertness: Awake/alert Behavior During Therapy: WFL for tasks assessed/performed Overall Cognitive Status: Within Functional Limits for tasks assessed                                Home Living Family/patient expects to be discharged to:: Private residence Living Arrangements: Spouse/significant other;Children Available Help at Discharge: Family Type of Home: House Home Access: Stairs to enter Technical brewer of Steps: 1 Entrance Stairs-Rails: None Home Layout: One level               Home Equipment: Cane - single point   Additional Comments: Pt states son and husband are attempting to arrange days off to assist with care at point of dc home      Prior Functioning/Environment Level of Independence: Independent with assistive device(s)             OT Diagnosis: Generalized weakness;Acute pain   OT Problem List: Decreased strength;Decreased activity tolerance   OT Treatment/Interventions: Self-care/ADL training;DME and/or AE instruction;Patient/family education    OT Goals(Current goals can be found in the care plan section) Acute Rehab OT Goals Patient Stated Goal: HOME OT Goal Formulation: With patient Time For Goal Achievement: 12/10/14 Potential to Achieve Goals: Good ADL Goals Pt Will Perform Grooming: with modified independence;standing Pt Will Perform Lower Body Dressing: with modified independence;sit to/from stand Pt Will Transfer to Toilet: with modified independence;ambulating;regular height toilet Pt  Will Perform Toileting - Clothing Manipulation and hygiene: with modified independence;sit to/from stand  OT Frequency: Min 2X/week           Co-evaluation              End of Session Nurse Communication: Mobility status  Activity Tolerance: Patient tolerated treatment well Patient left: in chair;with call bell/phone within reach;with family/visitor present   Time: [id number] OT Time  Calculation (min): 23 min Charges:  OT General Charges $OT Visit: 1 Procedure OT Evaluation $Initial OT Evaluation Tier I: 1 Procedure OT Treatments $Self Care/Home Management : 8-22 mins G-Codes:    Payton Mccallum D 2014-12-16, 9:24 AM

## 2014-11-26 NOTE — Progress Notes (Signed)
ANTICOAGULATION CONSULT NOTE - Follow up  Pharmacy Consult for Warfarin Indication: atrial fibrillation  Allergies  Allergen Reactions  . Dilaudid [Hydromorphone Hcl]     Pt unsure   . Stadol [Butorphanol]     Went crazy    Patient Measurements: Height: 5\' 1"  (154.9 cm) Weight: 219 lb (99.338 kg) IBW/kg (Calculated) : 47.8  Vital Signs: Temp: 97.6 F (36.4 C) (03/17 0544) Temp Source: Oral (03/17 0544) BP: 114/54 mmHg (03/17 0544) Pulse Rate: 52 (03/17 0544)  Labs:  Recent Labs  11/24/14 1000  11/24/14 1640 11/25/14 0518 11/26/14 0510  HGB  --   < > 12.0 10.4* 9.5*  HCT  --   --  36.8 31.9* 29.7*  PLT  --   --  246 220 211  LABPROT 15.4*  --   --  16.0* 18.9*  INR 1.20  --   --  1.27 1.57*  CREATININE  --   --  1.07 1.33* 1.53*  < > = values in this interval not displayed.  Estimated Creatinine Clearance: 36.4 mL/min (by C-G formula based on Cr of 1.53).   Medical History: Past Medical History  Diagnosis Date  . Type 2 diabetes mellitus   . Essential hypertension, benign   . CVA (cerebral infarction)   . Paroxysmal atrial fibrillation     Detected on tele [facility] 3/14   . Hyperlipidemia   . Carotid artery disease     Nonobstructive; < 50% bilateral ICA stenosis, 07/2007.  Marland Kitchen Coronary atherosclerosis of native coronary artery     75% distal LAD, 75% diagonal, DES to mid RCA - March 2014  . Stroke   . Skin abnormality     right wrist rea scab area    Medications:  Scheduled:  . amLODipine  5 mg Oral Daily  . atenolol  25 mg Oral BID  . celecoxib  200 mg Oral Q12H  . dexamethasone  10 mg Intravenous Once  . docusate sodium  100 mg Oral BID  . enoxaparin (LOVENOX) injection  40 mg Subcutaneous Q24H  . ferrous sulfate  325 mg Oral TID PC  . glipiZIDE  5 mg Oral QAC breakfast  . HYDROcodone-acetaminophen  1-2 tablet Oral Q4H  . insulin aspart  0-15 Units Subcutaneous TID WC  . linagliptin  5 mg Oral Q breakfast  . polyethylene glycol  17 g Oral BID  .  simvastatin  10 mg Oral QHS  . Warfarin - Pharmacist Dosing Inpatient   Does not apply q1800   Infusions:  . sodium chloride 0.9 % 1,000 mL with potassium chloride 10 mEq infusion 100 mL/hr (11/25/14 0455)    Assessment: 69 yr female with left hip OA and pain.  H/O CVA, CAD, DM and PAF (on warfarin PTA).  Underwent THA 3/15 and warfarin per pharmacy dosing ordered to resume post-op. PTA on warfarin 5mg  daily except 2.5mg  on Tu/Th/Sat  3/15: INR 1.20;warfarin 7.5 mg 3/16: INR 1.27; warfarin 5 mg  Today, 11/26/2014:  INR rising appropriately after 2nd dose of warfarin  CBC ok, post-op anemia as expected.  Plt wnl  No bleeding reported/documented.  Eating 100% of meals  No interacting PTA meds, celecoxib in hosp.  Goal of Therapy:  INR 2-3   Plan:   Give warfarin 5 mg po x 1 again today.  Once INR > 1.8 would consider resuming PTA regimen  Cont Lovenox 40mg  daily per MD.  Check daily PT/INR.  CBC at least q72 hr while on warfarin  Reuel Boom,  PharmD Pager: [phone] 11/26/2014, 8:47 AM

## 2014-11-26 NOTE — Progress Notes (Signed)
Physical Therapy Treatment Patient Details Name: Bianca White MRN: [id number] DOB: 1946/04/15 Today's Date: 12/01/2014    History of Present Illness L THR    PT Comments    Pt continues to progress steadily with mobility.  Pt hopeful for dc home tomorrow.  Follow Up Recommendations  Home health PT;SNF     Equipment Recommendations  Rolling walker with 5" wheels    Recommendations for Other Services OT consult     Precautions / Restrictions Precautions Precautions: Fall Restrictions Weight Bearing Restrictions: No Other Position/Activity Restrictions: WBAT    Mobility  Bed Mobility                  Transfers Overall transfer level: Needs assistance Equipment used: Rolling walker (2 wheeled) Transfers: Sit to/from Stand Sit to Stand: Min guard         General transfer comment: cues for LE management and use of UEs to self assist  Ambulation/Gait Ambulation/Gait assistance: Min assist;Min guard Ambulation Distance (Feet): 222 Feet Assistive device: Rolling walker (2 wheeled) Gait Pattern/deviations: Step-to pattern;Step-through pattern;Decreased step length - right;Decreased step length - left;Shuffle;Trunk flexed Gait velocity: decr   General Gait Details: cues for sequence, posture and position from [employer]            Wheelchair Mobility    Modified Rankin (Stroke Patients Only)       Balance                                    Cognition Arousal/Alertness: Awake/alert Behavior During Therapy: WFL for tasks assessed/performed Overall Cognitive Status: Within Functional Limits for tasks assessed                      Exercises      General Comments        Pertinent Vitals/Pain Pain Assessment: 0-10 Pain Score: 4  Pain Location: L hip Pain Descriptors / Indicators: Aching;Sore Pain Intervention(s): Limited activity within patient's tolerance;Monitored during session;Premedicated before session     Home Living                      Prior Function            PT Goals (current goals can now be found in the care plan section) Acute Rehab PT Goals Patient Stated Goal: HOME PT Goal Formulation: With patient Time For Goal Achievement: 12/02/14 Potential to Achieve Goals: Fair Progress towards PT goals: Progressing toward goals    Frequency  7X/week    PT Plan Current plan remains appropriate    Co-evaluation             End of Session Equipment Utilized During Treatment: Gait belt Activity Tolerance: Patient tolerated treatment well Patient left: Other (comment) (bathroom)     Time: [id number] PT Time Calculation (min) (ACUTE ONLY): 16 min  Charges:  $Gait Training: 8-22 mins                    G Codes:      Namiah Dunnavant 12/01/14, 2:29 PM

## 2014-11-26 NOTE — Progress Notes (Signed)
     Subjective: 2 Days Post-Op Procedure(s) (LRB): LEFT TOTAL HIP ARTHROPLASTY ANTERIOR APPROACH (Left)   Patient reports pain as mild, pain controlled. No events throughout the night. Working hard with PT. Expecting to possibly going home tomorrow.  Objective:   VITALS:   Filed Vitals:   11/26/14 0544  BP: 114/54  Pulse: 52  Temp: 97.6 F (36.4 C)  Resp: 18    Dorsiflexion/Plantar flexion intact Incision: dressing C/D/I No cellulitis present Compartment soft  LABS  Recent Labs  11/24/14 1640 11/25/14 0518 11/26/14 0510  HGB 12.0 10.4* 9.5*  HCT 36.8 31.9* 29.7*  WBC 15.3* 10.1 9.6  PLT 246 220 211     Recent Labs  11/24/14 1640 11/25/14 0518 11/26/14 0510  NA  --  141 139  K  --  4.9 4.6  BUN  --  28* 40*  CREATININE 1.07 1.33* 1.53*  GLUCOSE  --  261* 194*     Assessment/Plan: 2 Days Post-Op Procedure(s) (LRB): LEFT TOTAL HIP ARTHROPLASTY ANTERIOR APPROACH (Left) Up with therapy Discharge home with home health eventually, when ready Follow up in 2 weeks at [facility]. Follow up with OLIN,Rya Rausch D in 2 weeks.  Contact information:  [facility] [address], [place] [address] [phone]    Morbid Obesity (BMI >40)  Estimated body mass index is 41.4 kg/(m^2) as calculated from the following:  Height as of this encounter: 5\' 1"  (1.549 m).  Weight as of this encounter: 99.338 kg (219 lb). Patient also counseled that weight may inhibit the healing process Patient counseled that losing weight will help with future health issues       West Pugh. Lyon Dumont   PAC  11/26/2014, 8:26 AM

## 2014-11-26 NOTE — Progress Notes (Signed)
PHARMACIST - PHYSICIAN COMMUNICATION DR:  Alvan Dame CONCERNING:  METFORMIN SAFE ADMINISTRATION POLICY  RECOMMENDATION: Metformin has been placed on DISCONTINUE (rejected order) STATUS and should be reordered only after any of the conditions below are ruled out.  Current safety recommendations include avoiding metformin for a minimum of 48 hours after the patient's exposure to intravenous contrast media.  DESCRIPTION:  The Pharmacy Committee has adopted a policy that restricts the use of metformin in hospitalized patients until all the contraindications to administration have been ruled out. Specific contraindications are: []  Serum creatinine ? 1.5 for males [x]  Serum creatinine ? 1.4 for females []  Shock, acute MI, sepsis, hypoxemia, dehydration []  Planned administration of intravenous iodinated contrast media []  Heart Failure patients with low EF []  Acute or chronic metabolic acidosis (including DKA)  Please call with any questions.  Thanks,  Reuel Boom, PharmD Pager: [phone] 11/26/2014, 8:45 AM

## 2014-11-27 DIAGNOSIS — D500 Iron deficiency anemia secondary to blood loss (chronic): Secondary | ICD-10-CM | POA: Diagnosis not present

## 2014-11-27 LAB — GLUCOSE, CAPILLARY
Glucose-Capillary: 129 mg/dL — ABNORMAL HIGH (ref 70–99)
Glucose-Capillary: 155 mg/dL — ABNORMAL HIGH (ref 70–99)

## 2014-11-27 LAB — PROTIME-INR
INR: 1.77 — AB (ref 0.00–1.49)
PROTHROMBIN TIME: 20.8 s — AB (ref 11.6–15.2)

## 2014-11-27 MED ORDER — FERROUS SULFATE 325 (65 FE) MG PO TABS: 325.0000 mg | ORAL_TABLET | Freq: Three times a day (TID) | ORAL | Status: DC

## 2014-11-27 MED ORDER — WARFARIN SODIUM 5 MG PO TABS: 2.5000 mg | ORAL_TABLET | Freq: Every day | ORAL | Status: DC

## 2014-11-27 MED ORDER — DOCUSATE SODIUM 100 MG PO CAPS: 100.0000 mg | ORAL_CAPSULE | Freq: Two times a day (BID) | ORAL | Status: AC

## 2014-11-27 MED ORDER — WARFARIN SODIUM 5 MG PO TABS
5.0000 mg | ORAL_TABLET | Freq: Once | ORAL | Status: DC
  Filled 2014-11-27: qty 1

## 2014-11-27 MED ORDER — ENOXAPARIN SODIUM 40 MG/0.4ML ~~LOC~~ SOLN: 40.0000 mg | SUBCUTANEOUS | Status: DC

## 2014-11-27 MED ORDER — POLYETHYLENE GLYCOL 3350 17 G PO PACK: 17.0000 g | PACK | Freq: Two times a day (BID) | ORAL | Status: DC

## 2014-11-27 MED ORDER — HYDROCODONE-ACETAMINOPHEN 7.5-325 MG PO TABS: 1.0000 | ORAL_TABLET | ORAL | Status: DC | PRN

## 2014-11-27 NOTE — Progress Notes (Signed)
Pt to d/c home with [facility] home health. Equipment delivered to room at d/c. Pt medicated for pain at d/c. AVS reviewed and "My Chart" discussed with pt. Pt capable of verbalizing medications, signs and symptoms of infection, and follow-up appointments. Remains hemodynamically stable. No signs and symptoms of distress. Educated pt to return to ER in the case of SOB, dizziness, or chest pain.

## 2014-11-27 NOTE — Progress Notes (Signed)
Physical Therapy Treatment Patient Details Name: Bianca White MRN: [id number] DOB: [birth year] Today's Date: 12/25/2014    History of Present Illness L THR    PT Comments    Pt and caregiver feeling comfortable with abilities to progress to home.  Reviewed stairs and car transfers.    Follow Up Recommendations  Home health PT;SNF     Equipment Recommendations  Rolling walker with 5" wheels    Recommendations for Other Services OT consult     Precautions / Restrictions Precautions Precautions: Fall Restrictions Weight Bearing Restrictions: No Other Position/Activity Restrictions: WBAT    Mobility  Bed Mobility               General bed mobility comments: Pt in chair on arrival.  Transfers Overall transfer level: Needs assistance Equipment used: Rolling walker (2 wheeled) Transfers: Sit to/from Stand Sit to Stand: Supervision         General transfer comment: cues for hand placement  Ambulation/Gait Ambulation/Gait assistance: Min guard;Supervision Ambulation Distance (Feet): 150 Feet Assistive device: Rolling walker (2 wheeled) Gait Pattern/deviations: Step-to pattern;Step-through pattern;Decreased step length - right;Decreased step length - left;Shuffle;Trunk flexed Gait velocity: decr   General Gait Details: cues for sequence, posture and position from [employer]            Wheelchair Mobility    Modified Rankin (Stroke Patients Only)       Balance Overall balance assessment: Needs assistance Sitting-balance support: Feet supported Sitting balance-Leahy Scale: Good     Standing balance support: Bilateral upper extremity supported;During functional activity Standing balance-Leahy Scale: Good Standing balance comment: Pt able to let go of walker to groom for short amounts of time and kept balance.                    Cognition Arousal/Alertness: Awake/alert Behavior During Therapy: WFL for tasks  assessed/performed Overall Cognitive Status: Within Functional Limits for tasks assessed                      Exercises      General Comments        Pertinent Vitals/Pain Pain Assessment: 0-10 Pain Score: 2  Pain Location: L hip Pain Descriptors / Indicators: Aching;Sore Pain Intervention(s): Limited activity within patient's tolerance;Monitored during session;Premedicated before session;Ice applied    Home Living                      Prior Function            PT Goals (current goals can now be found in the care plan section) Acute Rehab PT Goals Patient Stated Goal: HOME PT Goal Formulation: With patient Time For Goal Achievement: 12/02/14 Potential to Achieve Goals: Fair Progress towards PT goals: Progressing toward goals    Frequency  7X/week    PT Plan Current plan remains appropriate    Co-evaluation             End of Session Equipment Utilized During Treatment: Gait belt Activity Tolerance: Patient tolerated treatment well Patient left: Other (comment) (bathroom)     Time: [id number] PT Time Calculation (min) (ACUTE ONLY): 10 min  Charges:  $Gait Training: 8-22 mins $Therapeutic Activity: 8-22 mins                    G Codes:      Emelie Newsom 25-Dec-2014, 12:51 PM

## 2014-11-27 NOTE — Progress Notes (Signed)
Occupational Therapy Treatment Patient Details Name: Bianca White MRN: [id number] DOB: March 10, 1946 Today's Date: 11/27/2014    History of present illness L THR   OT comments  Pt progressing well with all adls. Pt understands and demonstrated knowledge of shower transfer technique and is interested in adaptive equipment for LE dressing.  Friend is to purchase at gift store.  Pt should be safe to d/c home.  Follow Up Recommendations  Home health OT;Supervision - Intermittent    Equipment Recommendations  None recommended by OT    Recommendations for Other Services      Precautions / Restrictions Precautions Precautions: Fall Restrictions Weight Bearing Restrictions: No Other Position/Activity Restrictions: WBAT       Mobility Bed Mobility               General bed mobility comments: Pt in chair on arrival.  Transfers Overall transfer level: Needs assistance Equipment used: Rolling walker (2 wheeled) Transfers: Sit to/from Stand Sit to Stand: Supervision         General transfer comment: cues for hand placement    Balance Overall balance assessment: Needs assistance Sitting-balance support: Feet supported Sitting balance-Leahy Scale: Good     Standing balance support: Bilateral upper extremity supported;During functional activity Standing balance-Leahy Scale: Good Standing balance comment: Pt able to let go of walker to groom for short amounts of time and kept balance.                   ADL Overall ADL's : Needs assistance/impaired Eating/Feeding: Independent   Grooming: Wash/dry hands;Wash/dry face;Set up;Standing Grooming Details (indicate cue type and reason): Pt safe in standing grooming at the sink         Upper Body Dressing : Set up;Sitting   Lower Body Dressing: Minimal assistance;With adaptive equipment;Sit to/from stand   Toilet Transfer: Supervision/safety;Comfort height toilet;Ambulation;RW   Toileting- Clothing  Manipulation and Hygiene: Supervision/safety;Sit to/from stand   Tub/ Shower Transfer: Min guard;Walk-in shower;Ambulation;Rolling walker Tub/Shower Transfer Details (indicate cue type and reason): Pt transferred into shower posteriorly and out with min guard. Functional mobility during ADLs: Supervision/safety;Rolling walker General ADL Comments: Pt doing very well with adls and adl transfers.  Feel pt will be safe at home with initial supevision.      Vision                     Perception     Praxis      Cognition   Behavior During Therapy: WFL for tasks assessed/performed Overall Cognitive Status: Within Functional Limits for tasks assessed                       Extremity/Trunk Assessment               Exercises     Shoulder Instructions       General Comments      Pertinent Vitals/ Pain       Pain Assessment: 0-10 Pain Score: 3  Pain Location: L hip Pain Descriptors / Indicators: Aching Pain Intervention(s): Ice applied;Monitored during session  Home Living                                          Prior Functioning/Environment              Frequency Min 2X/week     Progress Toward Goals  OT Goals(current goals can now be found in the care plan section)  Progress towards OT goals: Progressing toward goals  Acute Rehab OT Goals Patient Stated Goal: HOME OT Goal Formulation: With patient Time For Goal Achievement: 12/10/14 Potential to Achieve Goals: Good ADL Goals Pt Will Perform Grooming: with modified independence;standing Pt Will Perform Lower Body Dressing: with modified independence;sit to/from stand Pt Will Transfer to Toilet: with modified independence;ambulating;regular height toilet Pt Will Perform Toileting - Clothing Manipulation and hygiene: with modified independence;sit to/from stand  Plan Discharge plan remains appropriate    Co-evaluation                 End of Session Equipment  Utilized During Treatment: Rolling walker   Activity Tolerance Patient tolerated treatment well   Patient Left in chair;with call bell/phone within reach;with family/visitor present   Nurse Communication Mobility status        Time: [id number] OT Time Calculation (min): 36 min  Charges: OT General Charges $OT Visit: 1 Procedure OT Treatments $Self Care/Home Management : 23-37 mins  Glenford Peers 11/27/2014, 11:15 AM  [phone]

## 2014-11-27 NOTE — Progress Notes (Signed)
Physical Therapy Treatment Patient Details Name: Bianca White MRN: [id number] DOB: 1946/03/04 Today's Date: 12-06-14    History of Present Illness L THR    PT Comments    Continued progress with mobility.    Follow Up Recommendations  Home health PT;SNF     Equipment Recommendations  Rolling walker with 5" wheels    Recommendations for Other Services OT consult     Precautions / Restrictions Precautions Precautions: Fall Restrictions Weight Bearing Restrictions: No Other Position/Activity Restrictions: WBAT    Mobility  Bed Mobility               General bed mobility comments: Pt in chair on arrival.  Transfers Overall transfer level: Needs assistance Equipment used: Rolling walker (2 wheeled) Transfers: Sit to/from Stand Sit to Stand: Supervision         General transfer comment: cues for hand placement  Ambulation/Gait Ambulation/Gait assistance: Min guard;Supervision Ambulation Distance (Feet): 150 Feet Assistive device: Rolling walker (2 wheeled) Gait Pattern/deviations: Step-to pattern;Step-through pattern;Decreased step length - right;Decreased step length - left;Shuffle;Trunk flexed Gait velocity: decr   General Gait Details: cues for sequence, posture and position from [employer]            Wheelchair Mobility    Modified Rankin (Stroke Patients Only)       Balance Overall balance assessment: Needs assistance Sitting-balance support: Feet supported Sitting balance-Leahy Scale: Good     Standing balance support: Bilateral upper extremity supported;During functional activity Standing balance-Leahy Scale: Good Standing balance comment: Pt able to let go of walker to groom for short amounts of time and kept balance.                    Cognition Arousal/Alertness: Awake/alert Behavior During Therapy: WFL for tasks assessed/performed Overall Cognitive Status: Within Functional Limits for tasks assessed                      Exercises      General Comments        Pertinent Vitals/Pain Pain Assessment: 0-10 Pain Score: 3  Pain Location: L hip Pain Descriptors / Indicators: Aching Pain Intervention(s): Ice applied;Monitored during session    Home Living                      Prior Function            PT Goals (current goals can now be found in the care plan section) Acute Rehab PT Goals Patient Stated Goal: HOME PT Goal Formulation: With patient Time For Goal Achievement: 12/02/14 Potential to Achieve Goals: Fair Progress towards PT goals: Progressing toward goals    Frequency  7X/week    PT Plan Current plan remains appropriate    Co-evaluation             End of Session Equipment Utilized During Treatment: Gait belt Activity Tolerance: Patient tolerated treatment well Patient left: Other (comment) (bathroom)     Time: [id number] PT Time Calculation (min) (ACUTE ONLY): 16 min  Charges:  $Gait Training: 8-22 mins                    G Codes:      Uchechukwu Dhawan 12-06-14, 12:47 PM

## 2014-11-27 NOTE — Progress Notes (Signed)
ANTICOAGULATION CONSULT NOTE - Follow up  Pharmacy Consult for Warfarin Indication: atrial fibrillation  Allergies  Allergen Reactions  . Dilaudid [Hydromorphone Hcl]     Pt unsure   . Stadol [Butorphanol]     Went crazy    Patient Measurements: Height: 5\' 1"  (154.9 cm) Weight: 219 lb (99.338 kg) IBW/kg (Calculated) : 47.8  Vital Signs: Temp: 97.5 F (36.4 C) (03/18 0532) Temp Source: Oral (03/18 0532) BP: 111/52 mmHg (03/18 1001) Pulse Rate: 58 (03/18 1001)  Labs:  Recent Labs  11/24/14 1640 11/25/14 0518 11/26/14 0510 11/27/14 0455  HGB 12.0 10.4* 9.5*  --   HCT 36.8 31.9* 29.7*  --   PLT 246 220 211  --   LABPROT  --  16.0* 18.9* 20.8*  INR  --  1.27 1.57* 1.77*  CREATININE 1.07 1.33* 1.53*  --     Estimated Creatinine Clearance: 36.4 mL/min (by C-G formula based on Cr of 1.53).   Medical History: Past Medical History  Diagnosis Date  . Type 2 diabetes mellitus   . Essential hypertension, benign   . CVA (cerebral infarction)   . Paroxysmal atrial fibrillation     Detected on tele [facility] 3/14   . Hyperlipidemia   . Carotid artery disease     Nonobstructive; < 50% bilateral ICA stenosis, 07/2007.  Marland Kitchen Coronary atherosclerosis of native coronary artery     75% distal LAD, 75% diagonal, DES to mid RCA - March 2014  . Stroke   . Skin abnormality     right wrist rea scab area    Medications:  Scheduled:  . amLODipine  5 mg Oral Daily  . atenolol  25 mg Oral BID  . celecoxib  200 mg Oral Q12H  . dexamethasone  10 mg Intravenous Once  . docusate sodium  100 mg Oral BID  . enoxaparin (LOVENOX) injection  40 mg Subcutaneous Q24H  . ferrous sulfate  325 mg Oral TID PC  . glipiZIDE  5 mg Oral QAC breakfast  . HYDROcodone-acetaminophen  1-2 tablet Oral Q4H  . insulin aspart  0-15 Units Subcutaneous TID WC  . linagliptin  5 mg Oral Q breakfast  . polyethylene glycol  17 g Oral BID  . simvastatin  10 mg Oral QHS  . Warfarin - Pharmacist Dosing Inpatient    Does not apply q1800   Infusions:  . sodium chloride 0.9 % 1,000 mL with potassium chloride 10 mEq infusion 100 mL/hr (11/25/14 0455)    Assessment: 69 yr female with left hip OA and pain.  H/O CVA, CAD, DM and PAF (on warfarin PTA).  Underwent THA 3/15 and warfarin per pharmacy dosing ordered to resume post-op. PTA on warfarin 5mg  daily except 2.5mg  on Tu/Th/Sat  . Today, 11/27/2014:  INR continues to rise but not yet therapeutic after warfarin x 3 days (see MAR for dosages).  CBC ok, post-op anemia as expected.  Plt wnl  No bleeding reported/documented.  Carb-modified diet, reportedly eating well.100% of meals  No interacting PTA meds, celecoxib in hosp.  Goal of Therapy:  INR 2-3   Plan:   Warfarin 5 mg po x 1 today as per home regimen.  Cont Lovenox 40mg  SQ daily per MD.  Check daily PT/INR while inpatient.  Clayburn Pert, PharmD, BCPS Pager: [phone] 11/27/2014  11:07 AM

## 2014-11-27 NOTE — Progress Notes (Signed)
     Subjective: 3 Days Post-Op Procedure(s) (LRB): LEFT TOTAL HIP ARTHROPLASTY ANTERIOR APPROACH (Left)   Patient reports pain as mild, pain controlled. No events throughout the night.    Objective:   VITALS:   Filed Vitals:   11/27/14  BP: 95/55  Pulse: 53  Temp: 97.5 F (36.4 C)   Resp: 16    Dorsiflexion/Plantar flexion intact Incision: dressing C/D/I No cellulitis present Compartment soft  LABS  Recent Labs  11/24/14 1640 11/25/14 0518 11/26/14 0510  HGB 12.0 10.4* 9.5*  HCT 36.8 31.9* 29.7*  WBC 15.3* 10.1 9.6  PLT 246 220 211     Recent Labs  11/24/14 1640 11/25/14 0518 11/26/14 0510  NA  --  141 139  K  --  4.9 4.6  BUN  --  28* 40*  CREATININE 1.07 1.33* 1.53*  GLUCOSE  --  261* 194*     Assessment/Plan: 3 Days Post-Op Procedure(s) (LRB): LEFT TOTAL HIP ARTHROPLASTY ANTERIOR APPROACH (Left) Up with therapy Discharge home with home health today or tomorrow, depending on how well she does with PT.  Expected ABLA  Treated with iron and will observe  Morbid Obesity (BMI >40)  Estimated body mass index is 41.4 kg/(m^2) as calculated from the following:   Height as of this encounter: 5\' 1"  (1.549 m).   Weight as of this encounter: 99.338 kg (219 lb). Patient also counseled that weight may inhibit the healing process Patient counseled that losing weight will help with future health issues      West Pugh. Amman Bartel   PAC  11/27/2014, 8:50 AM

## 2014-11-30 NOTE — Discharge Summary (Signed)
Physician Discharge Summary  Patient ID: Bianca White MRN: [id number] DOB/AGE: [birth year] 71 y.o.  Admit date: 11/24/2014 Discharge date: 11/27/2014   Procedures:  Procedure(s) (LRB): LEFT TOTAL HIP ARTHROPLASTY ANTERIOR APPROACH (Left)  Attending Physician:  Dr. Paralee Cancel   Admission Diagnoses:   Left hip primary OA / pain  Discharge Diagnoses:  Principal Problem:   S/P left THA, AA Active Problems:   Morbid obesity   Expected blood loss anemia  Past Medical History  Diagnosis Date  . Type 2 diabetes mellitus   . Essential hypertension, benign   . CVA (cerebral infarction)   . Paroxysmal atrial fibrillation     Detected on tele [facility] 3/14   . Hyperlipidemia   . Carotid artery disease     Nonobstructive; < 50% bilateral ICA stenosis, 07/2007.  Marland Kitchen Coronary atherosclerosis of native coronary artery     75% distal LAD, 75% diagonal, DES to mid RCA - March 2014  . Stroke   . Skin abnormality     right wrist rea scab area    HPI:    Bianca White, 69 y.o. female, has a history of pain and functional disability in the left hip(s) due to arthritis and patient has failed non-surgical conservative treatments for greater than 12 weeks to include NSAID's and/or analgesics, use of assistive devices and activity modification. Onset of symptoms was gradual starting ~3 years ago with gradually worsening course since that time.The patient noted no past surgery on the left hip(s). Patient currently rates pain in the left hip at 10 out of 10 with activity. Patient has night pain, worsening of pain with activity and weight bearing, trendelenberg gait, pain that interfers with activities of daily living and pain with passive range of motion. Patient has evidence of periarticular osteophytes and joint space narrowing by imaging studies. This condition presents safety issues increasing the risk of falls. There is no current active infection. Risks, benefits and expectations were  discussed with the patient. Risks including but not limited to the risk of anesthesia, blood clots, nerve damage, blood vessel damage, failure of the prosthesis, infection and up to and including death. Patient understand the risks, benefits and expectations and wishes to proceed with surgery.   PCP: ADDIS,DANIEL, DO   Discharged Condition: good  Hospital Course:  Patient underwent the above stated procedure on 11/24/2014. Patient tolerated the procedure well and brought to the recovery room in good condition and subsequently to the floor.  POD #1 BP: 100/48 ; Pulse: 59 ; Temp: 98 F (36.7 C) ; Resp: 16 Patient reports pain as mild, pain controlled. She hasn't been up on the hip yet, but looking forward to working with PT. Dorsiflexion/plantar flexion intact, incision: dressing C/D/I, no cellulitis present and compartment soft.   LABS  Basename    HGB  10.4  HCT  31.9   POD #2  BP: 114/54 ; Pulse: 52 ; Temp: 97.6 F (36.4 C) ; Resp: 18 Patient reports pain as mild, pain controlled. No events throughout the night. Working hard with PT. Expecting to possibly going home tomorrow. Dorsiflexion/plantar flexion intact, incision: dressing C/D/I, no cellulitis present and compartment soft.   LABS  Basename    HGB  9.5  HCT  29.7   POD #3  BP: 95/55 ; Pulse: 53 ; Temp: 97.5 F (36.4 C) ; Resp: 16 Patient reports pain as mild, pain controlled. No events throughout the night. Ready to be discharged home.  LABS   No new labs  Discharge Exam: General appearance: alert, cooperative and no distress Extremities: Homans sign is negative, no sign of DVT, no edema, redness or tenderness in the calves or thighs and no ulcers, gangrene or trophic changes  Disposition: Home with follow up in 2 weeks   Follow-up Information    Follow up with [facility].   Why:  home health physical therapy and nurse for INR checks   Contact information:   [phone]      Follow up with  Mauri Pole, MD.   Specialty:  Orthopedic Surgery   Contact information:   [address] [place] [address] [phone]       Follow up with [facility].   Contact information:   [place] [address] [phone]       Discharge Instructions    Call MD / Call 911    Complete by:  As directed   If you experience chest pain or shortness of breath, CALL 911 and be transported to the hospital emergency room.  If you develope a fever above 101 F, pus (white drainage) or increased drainage or redness at the wound, or calf pain, call your surgeon's office.     Change dressing    Complete by:  As directed   Maintain surgical dressing until follow up in the clinic. If the edges start to pull up, may reinforce with tape. If the dressing is no longer working, may remove and cover with gauze and tape, but must keep the area dry and clean.  Call with any questions or concerns.     Constipation Prevention    Complete by:  As directed   Drink plenty of fluids.  Prune juice may be helpful.  You may use a stool softener, such as Colace (over the counter) 100 mg twice a day.  Use MiraLax (over the counter) for constipation as needed.     Diet - low sodium heart healthy    Complete by:  As directed      Discharge instructions    Complete by:  As directed   Maintain surgical dressing until follow up in the clinic. If the edges start to pull up, may reinforce with tape. If the dressing is no longer working, may remove and cover with gauze and tape, but must keep the area dry and clean.  Follow up in 2 weeks at [facility]. Call with any questions or concerns.     Increase activity slowly as tolerated    Complete by:  As directed      TED hose    Complete by:  As directed   Use stockings (TED hose) for 2 weeks on both leg(s).  You may remove them at night for sleeping.     Weight bearing as tolerated    Complete by:  As directed    Laterality:  left  Extremity:  Lower             Medication List    TAKE these medications        amLODipine 5 MG tablet  Commonly known as:  NORVASC  Take 5 mg by mouth daily.     atenolol 25 MG tablet  Commonly known as:  TENORMIN  Take 1 tablet (25 mg total) by mouth 2 (two) times daily.     cholecalciferol 1000 UNITS tablet  Commonly known as:  VITAMIN D  Take 1,000 Units by mouth daily.     docusate sodium  100 MG capsule  Commonly known as:  COLACE  Take 1 capsule (100 mg total) by mouth 2 (two) times daily.     enoxaparin 40 MG/0.4ML injection  Commonly known as:  LOVENOX  Inject 0.4 mLs (40 mg total) into the skin daily.     febuxostat 40 MG tablet  Commonly known as:  ULORIC  Take 40 mg by mouth daily as needed (Gout).     ferrous sulfate 325 (65 FE) MG tablet  Take 1 tablet (325 mg total) by mouth 3 (three) times daily after meals.     fish oil-omega-3 fatty acids 1000 MG capsule  Take 1 g by mouth daily.     furosemide 40 MG tablet  Commonly known as:  LASIX  Take 1 tablet (40 mg total) by mouth every other day.     glipiZIDE 5 MG tablet  Commonly known as:  GLUCOTROL  Take 5 mg by mouth daily before breakfast.     HYDROcodone-acetaminophen 7.5-325 MG per tablet  Commonly known as:  NORCO  Take 1-2 tablets by mouth every 4 (four) hours as needed for moderate pain.     nitroGLYCERIN 0.4 MG SL tablet  Commonly known as:  NITROSTAT  Place 1 tablet (0.4 mg total) under the tongue every 5 (five) minutes x 3 doses as needed for chest pain.     polyethylene glycol packet  Commonly known as:  MIRALAX / GLYCOLAX  Take 17 g by mouth 2 (two) times daily.     simvastatin 10 MG tablet  Commonly known as:  ZOCOR  Take 10 mg by mouth at bedtime.     sitaGLIPtin-metformin 50-500 MG per tablet  Commonly known as:  JANUMET  Take 1 tablet by mouth 2 (two) times daily with a meal.     warfarin 5 MG tablet  Commonly known as:  COUMADIN  Take 0.5-1 tablets  (2.5-5 mg total) by mouth daily. Monday, Wednesday, friday, and Sunday takes 5 mg. Tuesdays, Thursdays and Saturdays takes 2.5mg          Signed: West Pugh. Alonna Bartling   PA-C  11/30/2014, 2:41 PM

## 2015-07-20 ENCOUNTER — Ambulatory Visit (INDEPENDENT_AMBULATORY_CARE_PROVIDER_SITE_OTHER): Payer: Medicare Other | Admitting: Nurse Practitioner

## 2015-07-20 ENCOUNTER — Encounter: Payer: Self-pay | Admitting: Nurse Practitioner

## 2015-07-20 VITALS — BP 149/72 | HR 83 | Ht 61.0 in | Wt 211.0 lb

## 2015-07-20 DIAGNOSIS — I10 Essential (primary) hypertension: Secondary | ICD-10-CM | POA: Diagnosis not present

## 2015-07-20 DIAGNOSIS — I480 Paroxysmal atrial fibrillation: Secondary | ICD-10-CM

## 2015-07-20 DIAGNOSIS — I63239 Cerebral infarction due to unspecified occlusion or stenosis of unspecified carotid arteries: Secondary | ICD-10-CM | POA: Diagnosis not present

## 2015-07-20 DIAGNOSIS — E785 Hyperlipidemia, unspecified: Secondary | ICD-10-CM

## 2015-07-20 NOTE — Patient Instructions (Addendum)
Continue to manage risk factors  Keep systolic blood pressure less than 130, today's reading 149/72 Lipids are followed by PCP, keep  cholesterol less than 200, LDLless than 80.  Continue Coumadin for atrial fibrillation and secondary stroke prevention No further stroke or TIA symptoms since 2010 If recurrent stroke symptoms occur, call 911 and proceed to the hospital Discharge from neurologic services at this time

## 2015-07-20 NOTE — Progress Notes (Signed)
[facility]  PATIENT: Bianca White DOB: Nov 21, 1945   REASON FOR VISIT: follow up for Carotid artery occlusion with infarction HISTORY FROM: Patient    HISTORY OF PRESENT ILLNESS:Ms. Bianca White, 69 year old female returns for follow up. 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 She was given two thirds dose IV TPA and underwent clot extraction by Dr. Estanislado White with complete recanalization. She has had no new TIA or stroke symptoms since that time. She denies double vision, loss of vision, focal weakness, numbness, speech or swallowing problems, confusions, blackouts. Last carotid Doppler 02/20/2012 was negative for stenosis. She also has a long history of back pain but is unable to get steroid injections. She had a DES coronary stent in March 2014 and is currently on Coumadin . She returns for reevaluation.    REVIEW OF SYSTEMS: Full 14 system review of systems performed and notable only for those listed, all others are neg:  Constitutional: neg  Cardiovascular: neg Ear/Nose/Throat: neg  Skin: neg Eyes: neg Respiratory: neg Gastroitestinal: neg  Hematology/Lymphatic: neg  Endocrine: neg Musculoskeletal: Back pain, walking difficulty Allergy/Immunology: neg Neurological: neg Psychiatric: neg Sleep : neg   ALLERGIES: Allergies  Allergen Reactions  . Dilaudid [Hydromorphone Hcl]     Pt unsure   . Stadol [Butorphanol]     Went crazy    HOME MEDICATIONS: Outpatient Prescriptions Prior to Visit  Medication Sig Dispense Refill  . atenolol (TENORMIN) 25 MG tablet Take 1 tablet (25 mg total) by mouth 2 (two) times daily. 60 tablet 6  . cholecalciferol (VITAMIN D) 1000 UNITS tablet Take 1,000 Units by mouth daily.    Marland Kitchen docusate sodium (COLACE) 100 MG capsule Take 1 capsule (100 mg total) by mouth 2 (two) times daily. 10 capsule 0  . febuxostat (ULORIC) 40 MG  tablet Take 40 mg by mouth daily as needed (Gout).     . fish oil-omega-3 fatty acids 1000 MG capsule Take 1 g by mouth daily.    . furosemide (LASIX) 40 MG tablet Take 1 tablet (40 mg total) by mouth every other day. (Patient taking differently: Take 40 mg by mouth daily as needed. ) 30 tablet 3  . glipiZIDE (GLUCOTROL) 5 MG tablet Take 5 mg by mouth daily before breakfast.    . HYDROcodone-acetaminophen (NORCO) 7.5-325 MG per tablet Take 1-2 tablets by mouth every 4 (four) hours as needed for moderate pain. 100 tablet 0  . nitroGLYCERIN (NITROSTAT) 0.4 MG SL tablet Place 1 tablet (0.4 mg total) under the tongue every 5 (five) minutes x 3 doses as needed for chest pain. 25 tablet 3  . polyethylene glycol (MIRALAX / GLYCOLAX) packet Take 17 g by mouth 2 (two) times daily. 14 each 0  . simvastatin (ZOCOR) 10 MG tablet Take 10 mg by mouth at bedtime.    Marland Kitchen warfarin (COUMADIN) 5 MG tablet Take 0.5-1 tablets (2.5-5 mg total) by mouth daily. Monday, Wednesday, friday, and Sunday takes 5 mg. Tuesdays, Thursdays and Saturdays takes 2.5mg  45 tablet 6  . amLODipine (NORVASC) 5 MG tablet Take 5 mg by mouth daily.     Marland Kitchen enoxaparin (LOVENOX) 40 MG/0.4ML injection Inject 0.4 mLs (40 mg total) into the skin daily. (Patient not taking: Reported on 07/20/2015) 6 Syringe 0  . ferrous sulfate 325 (65 FE) MG tablet Take 1 tablet (325 mg total) by mouth 3 (three) times daily after meals. (Patient not taking: Reported  on 07/20/2015)  3  . sitaGLIPtin-metformin (JANUMET) 50-500 MG per tablet Take 1 tablet by mouth 2 (two) times daily with a meal.     No facility-administered medications prior to visit.    PAST MEDICAL HISTORY: Past Medical History  Diagnosis Date  . Type 2 diabetes mellitus ([place])   . Essential hypertension, benign   . CVA (cerebral infarction)   . Paroxysmal atrial fibrillation ([place])     Detected on tele [facility] 3/14   . Hyperlipidemia   . Carotid artery disease ([facility])     Nonobstructive; < 50%  bilateral ICA stenosis, 07/2007.  Marland Kitchen Coronary atherosclerosis of native coronary artery     75% distal LAD, 75% diagonal, DES to mid RCA - March 2014  . Stroke ([place])   . Skin abnormality     right wrist rea scab area    PAST SURGICAL HISTORY: Past Surgical History  Procedure Laterality Date  . Cholecystectomy    . Bilateral carpal tunnel release    . Cervical neck fusion    . Hernia repair    . Left heart catheterization with coronary angiogram N/A 11/25/2012    Procedure: LEFT HEART CATHETERIZATION WITH CORONARY ANGIOGRAM;  Surgeon: Hillary Bow, MD;  Location: [facility] CATH LAB;  Service: Cardiovascular;  Laterality: N/A;  . Percutaneous coronary stent intervention (pci-s)  11/25/2012    Procedure: PERCUTANEOUS CORONARY STENT INTERVENTION (PCI-S);  Surgeon: Hillary Bow, MD;  Location: [facility] CATH LAB;  Service: Cardiovascular;;  . Total hip arthroplasty Left 11/24/2014    Procedure: LEFT TOTAL HIP ARTHROPLASTY ANTERIOR APPROACH;  Surgeon: Paralee Cancel, MD;  Location: [facility] ORS;  Service: Orthopedics;  Laterality: Left;    FAMILY HISTORY: Family History  Problem Relation Age of Onset  . Stroke Father   . Stroke Mother     SOCIAL HISTORY: Social History   Social History  . Marital Status: Widowed    Spouse Name: N/A  . Number of Children: 1  . Years of Education: 9   Occupational History  . Retired    Social History Main Topics  . Smoking status: Never Smoker   . Smokeless tobacco: Never Used  . Alcohol Use: No  . Drug Use: No  . Sexual Activity: Not on file   Other Topics Concern  . Not on file   Social History Narrative   Patient is a widow and lives alone.   Patient has one child.   Patient is retired.   Patient is right-handed.   Patient drinks one cup of coffee in the am, sometimes.   Patient has a high school education.     PHYSICAL EXAM  Filed Vitals:   07/20/15 1321  BP: 149/72  Pulse: 83  Height: 5\' 1"  (1.549 m)  Weight: 211 lb (95.709 kg)    Body mass index is 39.89 kg/(m^2). Generalized: Well developed, in no acute distress  Head: normocephalic and atraumatic,. Oropharynx benign  Neck: Supple, no carotid bruits  Cardiac: Regular rate rhythm, no murmur  Musculoskeletal: No deformity   Neurological examination   Mentation: Alert oriented to time, place, history taking. Follows all commands speech and language fluent  Cranial nerve II-XII: Pupils were equal round reactive to light extraocular movements were full, visual field were full on confrontational test. Facial sensation and strength were normal. hearing was intact to finger rubbing bilaterally. Uvula tongue midline. head turning and shoulder shrug were normal and symmetric.Tongue protrusion into cheek strength was normal. Motor: normal bulk and tone, full strength in  the BUE, BLE, fine finger movements normal, no pronator drift. No focal weakness Sensory: normal and symmetric to light touch, pinprick, and vibration  Coordination: finger-nose-finger, heel-to-shin bilaterally, no dysmetria Reflexes: 1+ upper lower and symmetric, plantar responses were flexor bilaterally. Gait and Station: Rising up from seated position without assistance, normal stance, moderate stride, good arm swing, smooth turning, Tandem gait is mildly unsteady. No assistive device   DIAGNOSTIC DATA (LABS, IMAGING, TESTING) - I reviewed patient records, labs, notes, testing and imaging myself where available.  Lab Results  Component Value Date   WBC 9.6 11/26/2014   HGB 9.5* 11/26/2014   HCT 29.7* 11/26/2014   MCV 91.4 11/26/2014   PLT 211 11/26/2014      Component Value Date/Time   NA 139 11/26/2014 0510   K 4.6 11/26/2014 0510   CL 102 11/26/2014 0510   CO2 21 11/26/2014 0510   GLUCOSE 194* 11/26/2014 0510   BUN 40* 11/26/2014 0510   CREATININE 1.53* 11/26/2014 0510   CALCIUM 8.1* 11/26/2014 0510   PROT 6.0 05/05/2009 0620   ALBUMIN 2.8* 05/05/2009 0620   AST 22 05/05/2009  0620   ALT 22 05/05/2009 0620   ALKPHOS 65 05/05/2009 0620   BILITOT 0.7 05/05/2009 0620   GFRNONAA 33* 11/26/2014 0510   GFRAA 23* 11/26/2014 0510     ASSESSMENT AND PLAN  68 y.o. year old female  has a past medical history of of Type 2 diabetes mellitus; Essential hypertension, benign; CVA (cerebral infarction);Hyperlipidemia; Carotid artery disease; and Coronary atherosclerosis of native coronary artery and atrial fib here to follow up.  PLAN: Continue to manage risk factors  Keep systolic blood pressure less than 130, today's reading 149/72 Lipids are followed by PCP, keep  cholesterol less than 200, LDLless than 80.  Continue Coumadin for atrial fibrillation and secondary stroke prevention Hemoglobin A1c less than 6.5 No further stroke or TIA symptoms since 2010 If recurrent stroke symptoms occur, call 911 and proceed to the hospital Discharge from neurologic services at this time Dennie Bible, [facility], [facility], [facility] [address], [place], [place] [address] [phone]

## 2015-07-20 NOTE — Progress Notes (Signed)
I agree with the above plan 

## 2016-10-19 ENCOUNTER — Other Ambulatory Visit: Payer: Self-pay | Admitting: Neurosurgery

## 2016-10-20 ENCOUNTER — Other Ambulatory Visit: Payer: Self-pay | Admitting: Neurosurgery

## 2016-10-20 ENCOUNTER — Other Ambulatory Visit (HOSPITAL_COMMUNITY): Payer: Self-pay | Admitting: Neurosurgery

## 2016-10-20 DIAGNOSIS — G9519 Other vascular myelopathies: Secondary | ICD-10-CM

## 2016-10-23 ENCOUNTER — Ambulatory Visit (HOSPITAL_COMMUNITY): Payer: Medicare Other

## 2016-10-23 ENCOUNTER — Ambulatory Visit (HOSPITAL_COMMUNITY)
Admission: RE | Admit: 2016-10-23 | Discharge: 2016-10-23 | Disposition: A | Discharge status: Home / Self Care | Payer: Medicare Other | Source: Ambulatory Visit | Attending: Neurosurgery | Admitting: Neurosurgery

## 2016-10-23 ENCOUNTER — Encounter (HOSPITAL_COMMUNITY): Payer: Self-pay | Admitting: Radiology

## 2016-10-23 DIAGNOSIS — D1809 Hemangioma of other sites: Secondary | ICD-10-CM | POA: Insufficient documentation

## 2016-10-23 DIAGNOSIS — G9519 Other vascular myelopathies: Secondary | ICD-10-CM | POA: Insufficient documentation

## 2016-10-23 DIAGNOSIS — M899 Disorder of bone, unspecified: Secondary | ICD-10-CM | POA: Insufficient documentation

## 2016-10-23 LAB — POCT I-STAT CREATININE: CREATININE: 1.4 mg/dL — AB (ref 0.44–1.00)

## 2016-10-23 MED ORDER — GADOBENATE DIMEGLUMINE 529 MG/ML IV SOLN
9.0000 mL | Freq: Once | INTRAVENOUS | Status: AC | PRN
  Administered 2016-10-23: 9 mL via INTRAVENOUS

## 2016-10-26 ENCOUNTER — Other Ambulatory Visit (HOSPITAL_COMMUNITY): Payer: Self-pay | Admitting: Neurosurgery

## 2016-10-26 DIAGNOSIS — G9519 Other vascular myelopathies: Secondary | ICD-10-CM

## 2016-10-31 ENCOUNTER — Ambulatory Visit (HOSPITAL_COMMUNITY)
Admission: RE | Admit: 2016-10-31 | Discharge: 2016-10-31 | Disposition: A | Discharge status: Home / Self Care | Payer: Medicare Other | Source: Ambulatory Visit | Attending: Neurosurgery | Admitting: Neurosurgery

## 2016-10-31 DIAGNOSIS — G9519 Other vascular myelopathies: Secondary | ICD-10-CM | POA: Diagnosis present

## 2016-10-31 LAB — CREATININE, SERUM
Creatinine, Ser: 1.29 mg/dL — ABNORMAL HIGH (ref 0.44–1.00)
GFR, EST AFRICAN AMERICAN: 46 mL/min — AB (ref >60)
GFR, EST NON AFRICAN AMERICAN: 40 mL/min — AB (ref >60)

## 2016-10-31 MED ORDER — GADOBENATE DIMEGLUMINE 529 MG/ML IV SOLN
25.0000 mL | Freq: Once | INTRAVENOUS | Status: AC
  Administered 2016-10-31: 25 mL via INTRAVENOUS

## 2019-04-29 ENCOUNTER — Other Ambulatory Visit: Payer: Self-pay

## 2019-04-29 DIAGNOSIS — I83899 Varicose veins of unspecified lower extremities with other complications: Secondary | ICD-10-CM

## 2019-05-06 ENCOUNTER — Encounter: Payer: Medicare Other | Admitting: Vascular Surgery

## 2019-05-06 ENCOUNTER — Encounter (HOSPITAL_COMMUNITY): Payer: Medicare Other

## 2019-05-07 ENCOUNTER — Encounter: Payer: Self-pay | Admitting: Family

## 2021-07-22 NOTE — Patient Instructions (Signed)
DUE TO COVID-19 ONLY ONE VISITOR IS ALLOWED TO COME WITH YOU AND STAY IN THE WAITING ROOM ONLY DURING PRE OP AND PROCEDURE DAY OF SURGERY IF YOU ARE GOING HOME AFTER SURGERY. IF YOU ARE SPENDING THE NIGHT 2 PEOPLE MAY VISIT WITH YOU IN YOUR PRIVATE ROOM AFTER SURGERY UNTIL VISITING  HOURS ARE OVER AT 800 PM AND 1  VISITOR  MAY  SPEND THE NIGHT.   YOU NEED TO HAVE A COVID 19 TEST ON__11/18_THIS TEST MUST BE DONE BEFORE SURGERY,  COVID TESTING SITE  IS LOCATED AT [place], [place]. REMAIN IN YOUR CAR THIS IS A DRIVE UP TEST. AFTER YOUR COVID TEST PLEASE WEAR A MASK OUT IN PUBLIC AND SOCIAL DISTANCE AND [place] YOUR HANDS FREQUENTLY, ALSO ASK ALL YOUR CLOSE CONTACT PERSONS TO WEAR A MASK AND SOCIAL DISTANCE AND [place] THEIR HANDS FREQUENTLY ALSO.               Bianca White     Your procedure is scheduled on: 08/02/21   Report to [facility] Main  Entrance   Report to admitting at  10:10 AM     Call this number if you have problems the morning of surgery [phone]   No food after midnight.    You may have clear liquid until 9:30 AM.    At 9:00 AM drink pre surgery drink.   Nothing by mouth after 9:30 AM.    CLEAR LIQUID DIET   Foods Allowed                                                                     Foods Excluded  Coffee and tea, regular and decaf                             liquids that you cannot  Plain Jell-O any favor except red or purple                                           see through such as: Fruit ices (not with fruit pulp)                                     milk, soups, orange juice  Iced Popsicles                                    All solid food Carbonated beverages, regular and diet                                    Cranberry, grape and apple juices Sports drinks like Gatorade Lightly seasoned clear broth or consume(fat free) Sugar    BRUSH YOUR TEETH MORNING OF SURGERY AND RINSE YOUR MOUTH OUT, NO CHEWING GUM CANDY OR  MINTS.     Take these medicines the morning of surgery with A  SIP OF WATER: Omeprazole, Atenolol        How to Manage Your Diabetes Before and After Surgery  Why is it important to control my blood sugar before and after surgery? Improving blood sugar levels before and after surgery helps healing and can limit problems. A way of improving blood sugar control is eating a healthy diet by:  Eating less sugar and carbohydrates  Increasing activity/exercise  Talking with your doctor about reaching your blood sugar goals High blood sugars (greater than 180 mg/dL) can raise your risk of infections and slow your recovery, so you will need to focus on controlling your diabetes during the weeks before surgery. Make sure that the doctor who takes care of your diabetes knows about your planned surgery including the date and location.  How do I manage my blood sugar before surgery? Check your blood sugar at least 4 times a day, starting 2 days before surgery, to make sure that the level is not too high or low. Check your blood sugar the morning of your surgery when you wake up and every 2 hours until you get to the Short Stay unit. If your blood sugar is less than 70 mg/dL, you will need to treat for low blood sugar: Do not take insulin. Treat a low blood sugar (less than 70 mg/dL) with  cup of clear juice (cranberry or apple), 4 glucose tablets, OR glucose gel. Recheck blood sugar in 15 minutes after treatment (to make sure it is greater than 70 mg/dL). If your blood sugar is not greater than 70 mg/dL on recheck, call [phone] for further instructions. Report your blood sugar to the short stay nurse when you get to Short Stay.  If you are admitted to the hospital after surgery: Your blood sugar will be checked by the staff and you will probably be given insulin after surgery (instead of oral diabetes medicines) to make sure you have good blood sugar levels. The goal for blood sugar control after  surgery is 80-180 mg/dL.   WHAT DO I DO ABOUT MY DIABETES MEDICATION?  Do not take oral diabetes medicines (pills) the morning of surgery.                           You may not have any metal on your body including hair pins and              piercings  Do not wear jewelry, make-up, lotions, powders or perfumes, deodorant             Do not wear nail polish on your fingernails.  Do not shave  48 hours prior to surgery.               Do not bring valuables to the hospital. [place].  Contacts, dentures or bridgework may not be worn into surgery.  Leave suitcase in the car. After surgery it may be brought to your room.                  [place] - Preparing for Surgery Before surgery, you can play an important role.  Because skin is not sterile, your skin needs to be as free of germs as possible.  You can reduce the number of germs on your skin by washing with CHG (chlorahexidine gluconate) soap before surgery.  CHG  is an antiseptic cleaner which kills germs and bonds with the skin to continue killing germs even after washing. Please DO NOT use if you have an allergy to CHG or antibacterial soaps.  If your skin becomes reddened/irritated stop using the CHG and inform your nurse when you arrive at Short Stay. Do not shave (including legs and underarms) for at least 48 hours prior to the first CHG shower.   Please follow these instructions carefully:  1.  Shower with CHG Soap the night before surgery and the  morning of Surgery.  2.  If you choose to wash your hair, wash your hair first as usual with your  normal  shampoo.  3.  After you shampoo, rinse your hair and body thoroughly to remove the  shampoo.                            4.  Use CHG as you would any other liquid soap.  You can apply chg directly  to the skin and wash                       Gently with a scrungie or clean washcloth.  5.  Apply the CHG Soap to your body ONLY FROM  THE NECK DOWN.   Do not use on face/ open                           Wound or open sores. Avoid contact with eyes, ears mouth and genitals (private parts).                       Wash face,  Genitals (private parts) with your normal soap.             6.  Wash thoroughly, paying special attention to the area where your surgery  will be performed.  7.  Thoroughly rinse your body with warm water from the neck down.  8.  DO NOT shower/wash with your normal soap after using and rinsing off  the CHG Soap.                9.  Pat yourself dry with a clean towel.            10.  Wear clean pajamas.            11.  Place clean sheets on your bed the night of your first shower and do not  sleep with pets. Day of Surgery : Do not apply any lotions/deodorants the morning of surgery.  Please wear clean clothes to the hospital/surgery center.  FAILURE TO FOLLOW THESE INSTRUCTIONS MAY RESULT IN THE CANCELLATION OF YOUR SURGERY PATIENT SIGNATURE_________________________________  NURSE SIGNATURE__________________________________  ________________________________________________________________________   Adam Phenix  An incentive spirometer is a tool that can help keep your lungs clear and active. This tool measures how well you are filling your lungs with each breath. Taking long deep breaths may help reverse or decrease the chance of developing breathing (pulmonary) problems (especially infection) following: A long period of time when you are unable to move or be active. BEFORE THE PROCEDURE  If the spirometer includes an indicator to show your best effort, your nurse or respiratory therapist will set it to a desired goal. If possible, sit up straight or lean slightly forward. Try not to slouch. Hold the incentive spirometer in an upright position.  INSTRUCTIONS FOR USE  Sit on the edge of your bed if possible, or sit up as far as you can in bed or on a chair. Hold the incentive spirometer in an  upright position. Breathe out normally. Place the mouthpiece in your mouth and seal your lips tightly around it. Breathe in slowly and as deeply as possible, raising the piston or the ball toward the top of the column. Hold your breath for 3-5 seconds or for as long as possible. Allow the piston or ball to fall to the bottom of the column. Remove the mouthpiece from your mouth and breathe out normally. Rest for a few seconds and repeat Steps 1 through 7 at least 10 times every 1-2 hours when you are awake. Take your time and take a few normal breaths between deep breaths. The spirometer may include an indicator to show your best effort. Use the indicator as a goal to work toward during each repetition. After each set of 10 deep breaths, practice coughing to be sure your lungs are clear. If you have an incision (the cut made at the time of surgery), support your incision when coughing by placing a pillow or rolled up towels firmly against it. Once you are able to get out of bed, walk around indoors and cough well. You may stop using the incentive spirometer when instructed by your caregiver.  RISKS AND COMPLICATIONS Take your time so you do not get dizzy or light-headed. If you are in pain, you may need to take or ask for pain medication before doing incentive spirometry. It is harder to take a deep breath if you are having pain. AFTER USE Rest and breathe slowly and easily. It can be helpful to keep track of a log of your progress. Your caregiver can provide you with a simple table to help with this. If you are using the spirometer at home, follow these instructions: [place] IF:  You are having difficultly using the spirometer. You have trouble using the spirometer as often as instructed. Your pain medication is not giving enough relief while using the spirometer. You develop fever of 100.5 F (38.1 C) or higher. SEEK IMMEDIATE MEDICAL CARE IF:  You cough up bloody sputum that had  not been present before. You develop fever of 102 F (38.9 C) or greater. You develop worsening pain at or near the incision site. MAKE SURE YOU:  Understand these instructions. Will watch your condition. Will get help right away if you are not doing well or get worse. Document Released: 01/08/2007 Document Revised: 11/20/2011 Document Reviewed: 03/11/2007 [facility] Patient Information 2014 [place], [place].   ________________________________________________________________________

## 2021-07-25 ENCOUNTER — Emergency Department (HOSPITAL_COMMUNITY): Payer: Medicare Other

## 2021-07-25 ENCOUNTER — Encounter (HOSPITAL_COMMUNITY): Payer: Self-pay

## 2021-07-25 ENCOUNTER — Inpatient Hospital Stay (HOSPITAL_COMMUNITY)
Admission: EM | Admit: 2021-07-25 | Discharge: 2021-08-02 | DRG: 189 | Disposition: A | Discharge status: Home Health | Payer: Medicare Other | Attending: Internal Medicine | Admitting: Internal Medicine

## 2021-07-25 ENCOUNTER — Encounter (HOSPITAL_COMMUNITY)
Admission: RE | Admit: 2021-07-25 | Discharge: 2021-07-25 | Disposition: A | Discharge status: Home / Self Care | Payer: Medicare Other | Source: Ambulatory Visit | Attending: Anesthesiology | Admitting: Anesthesiology

## 2021-07-25 DIAGNOSIS — D631 Anemia in chronic kidney disease: Secondary | ICD-10-CM | POA: Diagnosis present

## 2021-07-25 DIAGNOSIS — Z79899 Other long term (current) drug therapy: Secondary | ICD-10-CM

## 2021-07-25 DIAGNOSIS — Z20822 Contact with and (suspected) exposure to covid-19: Secondary | ICD-10-CM | POA: Diagnosis present

## 2021-07-25 DIAGNOSIS — E1122 Type 2 diabetes mellitus with diabetic chronic kidney disease: Secondary | ICD-10-CM | POA: Diagnosis present

## 2021-07-25 DIAGNOSIS — N179 Acute kidney failure, unspecified: Secondary | ICD-10-CM | POA: Diagnosis present

## 2021-07-25 DIAGNOSIS — E669 Obesity, unspecified: Secondary | ICD-10-CM | POA: Diagnosis present

## 2021-07-25 DIAGNOSIS — K5900 Constipation, unspecified: Secondary | ICD-10-CM | POA: Diagnosis present

## 2021-07-25 DIAGNOSIS — Z886 Allergy status to analgesic agent status: Secondary | ICD-10-CM

## 2021-07-25 DIAGNOSIS — Z8673 Personal history of transient ischemic attack (TIA), and cerebral infarction without residual deficits: Secondary | ICD-10-CM

## 2021-07-25 DIAGNOSIS — Z6839 Body mass index (BMI) 39.0-39.9, adult: Secondary | ICD-10-CM

## 2021-07-25 DIAGNOSIS — Z96642 Presence of left artificial hip joint: Secondary | ICD-10-CM | POA: Diagnosis present

## 2021-07-25 DIAGNOSIS — J984 Other disorders of lung: Secondary | ICD-10-CM | POA: Diagnosis present

## 2021-07-25 DIAGNOSIS — Z01818 Encounter for other preprocedural examination: Secondary | ICD-10-CM

## 2021-07-25 DIAGNOSIS — Z881 Allergy status to other antibiotic agents status: Secondary | ICD-10-CM

## 2021-07-25 DIAGNOSIS — I480 Paroxysmal atrial fibrillation: Secondary | ICD-10-CM | POA: Diagnosis present

## 2021-07-25 DIAGNOSIS — Z882 Allergy status to sulfonamides status: Secondary | ICD-10-CM

## 2021-07-25 DIAGNOSIS — I10 Essential (primary) hypertension: Secondary | ICD-10-CM | POA: Diagnosis present

## 2021-07-25 DIAGNOSIS — Z955 Presence of coronary angioplasty implant and graft: Secondary | ICD-10-CM

## 2021-07-25 DIAGNOSIS — J9601 Acute respiratory failure with hypoxia: Principal | ICD-10-CM | POA: Diagnosis present

## 2021-07-25 DIAGNOSIS — R109 Unspecified abdominal pain: Secondary | ICD-10-CM | POA: Diagnosis not present

## 2021-07-25 DIAGNOSIS — E785 Hyperlipidemia, unspecified: Secondary | ICD-10-CM | POA: Diagnosis present

## 2021-07-25 DIAGNOSIS — I501 Left ventricular failure: Secondary | ICD-10-CM | POA: Diagnosis present

## 2021-07-25 DIAGNOSIS — I69351 Hemiplegia and hemiparesis following cerebral infarction affecting right dominant side: Secondary | ICD-10-CM

## 2021-07-25 DIAGNOSIS — I130 Hypertensive heart and chronic kidney disease with heart failure and stage 1 through stage 4 chronic kidney disease, or unspecified chronic kidney disease: Secondary | ICD-10-CM | POA: Diagnosis present

## 2021-07-25 DIAGNOSIS — E119 Type 2 diabetes mellitus without complications: Secondary | ICD-10-CM

## 2021-07-25 DIAGNOSIS — Z794 Long term (current) use of insulin: Secondary | ICD-10-CM

## 2021-07-25 DIAGNOSIS — N184 Chronic kidney disease, stage 4 (severe): Secondary | ICD-10-CM | POA: Diagnosis present

## 2021-07-25 DIAGNOSIS — Z888 Allergy status to other drugs, medicaments and biological substances status: Secondary | ICD-10-CM

## 2021-07-25 DIAGNOSIS — I2510 Atherosclerotic heart disease of native coronary artery without angina pectoris: Secondary | ICD-10-CM | POA: Diagnosis present

## 2021-07-25 DIAGNOSIS — I272 Pulmonary hypertension, unspecified: Secondary | ICD-10-CM | POA: Diagnosis present

## 2021-07-25 DIAGNOSIS — N200 Calculus of kidney: Secondary | ICD-10-CM | POA: Diagnosis present

## 2021-07-25 DIAGNOSIS — Z981 Arthrodesis status: Secondary | ICD-10-CM

## 2021-07-25 DIAGNOSIS — Z7982 Long term (current) use of aspirin: Secondary | ICD-10-CM

## 2021-07-25 DIAGNOSIS — R7989 Other specified abnormal findings of blood chemistry: Secondary | ICD-10-CM | POA: Diagnosis present

## 2021-07-25 DIAGNOSIS — Z823 Family history of stroke: Secondary | ICD-10-CM

## 2021-07-25 DIAGNOSIS — R001 Bradycardia, unspecified: Secondary | ICD-10-CM | POA: Diagnosis present

## 2021-07-25 LAB — BASIC METABOLIC PANEL
Anion gap: 7 (ref 5–15)
BUN: 54 mg/dL — ABNORMAL HIGH (ref 8–23)
CO2: 28 mmol/L (ref 22–32)
Calcium: 9.5 mg/dL (ref 8.9–10.3)
Chloride: 105 mmol/L (ref 98–111)
Creatinine, Ser: 2.23 mg/dL — ABNORMAL HIGH (ref 0.44–1.00)
GFR, Estimated: 22 mL/min — ABNORMAL LOW (ref >60)
Glucose, Bld: 104 mg/dL — ABNORMAL HIGH (ref 70–99)
Potassium: 4.4 mmol/L (ref 3.5–5.1)
Sodium: 140 mmol/L (ref 135–145)

## 2021-07-25 LAB — CBC
HCT: 38.3 % (ref 36.0–46.0)
Hemoglobin: 12 g/dL (ref 12.0–15.0)
MCH: 28.9 pg (ref 26.0–34.0)
MCHC: 31.3 g/dL (ref 30.0–36.0)
MCV: 92.3 fL (ref 80.0–100.0)
Platelets: 195 10*3/uL (ref 150–400)
RBC: 4.15 MIL/uL (ref 3.87–5.11)
RDW: 14.5 % (ref 11.5–15.5)
WBC: 5.9 10*3/uL (ref 4.0–10.5)
nRBC: 0 % (ref 0.0–0.2)

## 2021-07-25 LAB — HEPATIC FUNCTION PANEL
ALT: 12 U/L (ref 0–44)
AST: 16 U/L (ref 15–41)
Albumin: 3.8 g/dL (ref 3.5–5.0)
Alkaline Phosphatase: 60 U/L (ref 38–126)
Bilirubin, Direct: 0.3 mg/dL — ABNORMAL HIGH (ref 0.0–0.2)
Indirect Bilirubin: 0.7 mg/dL (ref 0.3–0.9)
Total Bilirubin: 1 mg/dL (ref 0.3–1.2)
Total Protein: 7.1 g/dL (ref 6.5–8.1)

## 2021-07-25 LAB — CBG MONITORING, ED: Glucose-Capillary: 107 mg/dL — ABNORMAL HIGH (ref 70–99)

## 2021-07-25 LAB — PROTIME-INR
INR: 1.1 (ref 0.8–1.2)
Prothrombin Time: 13.7 seconds (ref 11.4–15.2)

## 2021-07-25 LAB — D-DIMER, QUANTITATIVE: D-Dimer, Quant: 1.62 ug/mL-FEU — ABNORMAL HIGH (ref 0.00–0.50)

## 2021-07-25 MED ORDER — SODIUM CHLORIDE 0.9 % IV BOLUS
500.0000 mL | Freq: Once | INTRAVENOUS | Status: AC
  Administered 2021-07-25: 500 mL via INTRAVENOUS

## 2021-07-25 MED ORDER — HYDROCODONE-ACETAMINOPHEN 5-325 MG PO TABS
1.0000 | ORAL_TABLET | Freq: Once | ORAL | Status: DC
  Filled 2021-07-25: qty 1

## 2021-07-25 MED ORDER — OXYCODONE-ACETAMINOPHEN 5-325 MG PO TABS
1.0000 | ORAL_TABLET | Freq: Once | ORAL | Status: AC
  Administered 2021-07-25: 1 via ORAL
  Filled 2021-07-25: qty 1

## 2021-07-25 MED ORDER — ONDANSETRON HCL 4 MG/2ML IJ SOLN
4.0000 mg | Freq: Once | INTRAMUSCULAR | Status: AC
  Administered 2021-07-25: 4 mg via INTRAVENOUS
  Filled 2021-07-25: qty 2

## 2021-07-25 MED ORDER — ACETAMINOPHEN 325 MG PO TABS
650.0000 mg | ORAL_TABLET | Freq: Once | ORAL | Status: AC
  Administered 2021-07-25: 650 mg via ORAL
  Filled 2021-07-25: qty 2

## 2021-07-25 NOTE — ED Provider Notes (Signed)
Emergency Medicine Provider Triage Evaluation Note  Bianca White , a 75 y.o. female  was evaluated in triage.  Pt complains of back pain.  While awaiting work-up patient found to be bradycardic in 40s.  She is asymptomatic.  She does take metoprolol twice daily.   Review of Systems  Positive: Back pain Negative: Chest pain, palpitations, shortness of breath, lightheadedness  Physical Exam  BP (!) 171/63   Pulse 82   Temp 97.8 F (36.6 C) (Oral)   Resp 14   Ht 5\' 10"  (1.778 m)   Wt 95.3 kg   SpO2 97%   BMI 30.13 kg/m  Gen:   Awake, no distress   Resp:  Normal effort  MSK:   Moves extremities without difficulty  Other:    Medical Decision Making  Medically screening exam initiated at 6:19 PM.  Appropriate orders placed.  Bianca White was informed that the remainder of the evaluation will be completed by another provider, this initial triage assessment does not replace that evaluation, and the importance of remaining in the ED until their evaluation is complete.  Discussed with Dr. Roderic Palau who recommended additional work-up.  CBC, BMP, EKG, and orthostatics ordered.  Given that she has no history of bradycardia on chart review she may require discussion with cardiology.    Evlyn Courier, PA-C 07/25/21 1821    Milton Ferguson, MD 07/29/21 [phone]

## 2021-07-25 NOTE — ED Notes (Signed)
Patient is not available for vitals at the moment.

## 2021-07-25 NOTE — Progress Notes (Signed)
Pt arrived to PAT visit  actively sick with Pain left flank. Admitting staff took her to the ED at 1:00 PM.

## 2021-07-25 NOTE — ED Triage Notes (Signed)
Pt arrived via POV, c/o back pain since Friday. Denies any injury. States worse with mvmt, or deep breathing.

## 2021-07-25 NOTE — ED Provider Notes (Signed)
Emergency Medicine Provider Triage Evaluation Note  Bianca White , a 75 y.o. female  was evaluated in triage.  Pt complains of left-sided flank and back pain starting 3 days ago.  She denies falling but states that her symptoms started when she was reaching for something with her left arm.  She had very significant pain over the past few days, worse with movement and palpation.  She is also noted a cough and is coughing up thick sputum.  No fevers.  No spine pain.  No abdominal pain.  Review of Systems  Positive: Flank pain Negative: Shortness of breath  Physical Exam  BP (!) 161/67 (BP Location: Left Arm)   Pulse (!) 43 Comment: RN Aware  Temp 97.8 F (36.6 C) (Oral)   Resp 14   Ht 5\' 10"  (1.778 m)   Wt 95.3 kg   SpO2 95%   BMI 30.13 kg/m  Gen:   Awake, no distress   Resp:  Normal effort  MSK:   Patient with tenderness to palpation over the left inferolateral and inferoposterior ribs  other:    Medical Decision Making  Medically screening exam initiated at 1:49 PM.  Appropriate orders placed.  Bianca White was informed that the remainder of the evaluation will be completed by another provider, this initial triage assessment does not replace that evaluation, and the importance of remaining in the ED until their evaluation is complete.  Chest x-ray with rib detail, Tylenol ordered.   Carlisle Cater, PA-C 07/25/21 [place], [place], DO 07/25/21 1456

## 2021-07-26 ENCOUNTER — Emergency Department (HOSPITAL_COMMUNITY): Payer: Medicare Other

## 2021-07-26 ENCOUNTER — Emergency Department (HOSPITAL_BASED_OUTPATIENT_CLINIC_OR_DEPARTMENT_OTHER): Payer: Medicare Other

## 2021-07-26 ENCOUNTER — Observation Stay (HOSPITAL_COMMUNITY): Payer: Medicare Other

## 2021-07-26 DIAGNOSIS — M79605 Pain in left leg: Secondary | ICD-10-CM

## 2021-07-26 DIAGNOSIS — J9601 Acute respiratory failure with hypoxia: Secondary | ICD-10-CM

## 2021-07-26 DIAGNOSIS — M79604 Pain in right leg: Secondary | ICD-10-CM

## 2021-07-26 LAB — BASIC METABOLIC PANEL
Anion gap: 7 (ref 5–15)
BUN: 51 mg/dL — ABNORMAL HIGH (ref 8–23)
CO2: 26 mmol/L (ref 22–32)
Calcium: 9.3 mg/dL (ref 8.9–10.3)
Chloride: 107 mmol/L (ref 98–111)
Creatinine, Ser: 2.37 mg/dL — ABNORMAL HIGH (ref 0.44–1.00)
GFR, Estimated: 21 mL/min — ABNORMAL LOW (ref >60)
Glucose, Bld: 113 mg/dL — ABNORMAL HIGH (ref 70–99)
Potassium: 4.8 mmol/L (ref 3.5–5.1)
Sodium: 140 mmol/L (ref 135–145)

## 2021-07-26 LAB — URINALYSIS, ROUTINE W REFLEX MICROSCOPIC
Bilirubin Urine: NEGATIVE
Glucose, UA: NEGATIVE mg/dL
Hgb urine dipstick: NEGATIVE
Ketones, ur: NEGATIVE mg/dL
Leukocytes,Ua: NEGATIVE
Nitrite: NEGATIVE
Protein, ur: 100 mg/dL — AB
Specific Gravity, Urine: 1.012 (ref 1.005–1.030)
pH: 5 (ref 5.0–8.0)

## 2021-07-26 LAB — RESP PANEL BY RT-PCR (FLU A&B, COVID) ARPGX2
Influenza A by PCR: NEGATIVE
Influenza B by PCR: NEGATIVE
SARS Coronavirus 2 by RT PCR: NEGATIVE

## 2021-07-26 LAB — CBG MONITORING, ED
Glucose-Capillary: 84 mg/dL (ref 70–99)
Glucose-Capillary: 107 mg/dL — ABNORMAL HIGH (ref 70–99)
Glucose-Capillary: 114 mg/dL — ABNORMAL HIGH (ref 70–99)

## 2021-07-26 LAB — HEMOGLOBIN A1C
Hgb A1c MFr Bld: 6 % — ABNORMAL HIGH (ref 4.8–5.6)
Mean Plasma Glucose: 125.5 mg/dL

## 2021-07-26 LAB — BRAIN NATRIURETIC PEPTIDE: B Natriuretic Peptide: 378.9 pg/mL — ABNORMAL HIGH (ref 0.0–100.0)

## 2021-07-26 MED ORDER — ONDANSETRON HCL 4 MG/2ML IJ SOLN
4.0000 mg | Freq: Once | INTRAMUSCULAR | Status: AC
  Administered 2021-07-26: 4 mg via INTRAVENOUS
  Filled 2021-07-26: qty 2

## 2021-07-26 MED ORDER — HYDRALAZINE HCL 25 MG PO TABS
100.0000 mg | ORAL_TABLET | Freq: Two times a day (BID) | ORAL | Status: DC
  Administered 2021-07-26 – 2021-07-27 (×2): 100 mg via ORAL
  Filled 2021-07-26 (×2): qty 4

## 2021-07-26 MED ORDER — ONDANSETRON HCL 4 MG/2ML IJ SOLN
4.0000 mg | Freq: Once | INTRAMUSCULAR | Status: AC
  Administered 2021-07-27: 4 mg via INTRAVENOUS
  Filled 2021-07-26: qty 2

## 2021-07-26 MED ORDER — SIMVASTATIN 10 MG PO TABS
10.0000 mg | ORAL_TABLET | Freq: Every day | ORAL | Status: DC
  Administered 2021-07-26 – 2021-08-01 (×7): 10 mg via ORAL
  Filled 2021-07-26 (×7): qty 1

## 2021-07-26 MED ORDER — AZITHROMYCIN 250 MG PO TABS
500.0000 mg | ORAL_TABLET | Freq: Every day | ORAL | Status: DC
  Administered 2021-07-26 – 2021-07-28 (×3): 500 mg via ORAL
  Filled 2021-07-26 (×3): qty 2

## 2021-07-26 MED ORDER — ASPIRIN EC 81 MG PO TBEC
81.0000 mg | DELAYED_RELEASE_TABLET | Freq: Every day | ORAL | Status: DC
  Administered 2021-07-27 – 2021-08-02 (×7): 81 mg via ORAL
  Filled 2021-07-26 (×7): qty 1

## 2021-07-26 MED ORDER — INSULIN ASPART 100 UNIT/ML IJ SOLN
0.0000 [IU] | Freq: Three times a day (TID) | INTRAMUSCULAR | Status: DC
  Administered 2021-07-27: 1 [IU] via SUBCUTANEOUS
  Administered 2021-07-27: 2 [IU] via SUBCUTANEOUS
  Administered 2021-07-28: 13:00:00 3 [IU] via SUBCUTANEOUS
  Administered 2021-07-29: 1 [IU] via SUBCUTANEOUS
  Administered 2021-07-29: 3 [IU] via SUBCUTANEOUS
  Administered 2021-07-30: 2 [IU] via SUBCUTANEOUS
  Administered 2021-07-30 – 2021-07-31 (×4): 1 [IU] via SUBCUTANEOUS
  Administered 2021-08-01: 2 [IU] via SUBCUTANEOUS
  Administered 2021-08-01: 1 [IU] via SUBCUTANEOUS
  Administered 2021-08-02: 5 [IU] via SUBCUTANEOUS
  Administered 2021-08-02: 1 [IU] via SUBCUTANEOUS
  Filled 2021-07-26: qty 0.09

## 2021-07-26 MED ORDER — ACETAMINOPHEN-CODEINE #3 300-30 MG PO TABS: 1.0000 | ORAL_TABLET | Freq: Once | ORAL | Status: DC

## 2021-07-26 MED ORDER — INSULIN ASPART 100 UNIT/ML IJ SOLN
0.0000 [IU] | Freq: Every day | INTRAMUSCULAR | Status: DC
Start: 2021-07-26 — End: 2021-08-02
  Filled 2021-07-26: qty 0.05

## 2021-07-26 MED ORDER — FUROSEMIDE 10 MG/ML IJ SOLN
80.0000 mg | Freq: Once | INTRAMUSCULAR | Status: DC
  Filled 2021-07-26: qty 8

## 2021-07-26 MED ORDER — HEPARIN SODIUM (PORCINE) 5000 UNIT/ML IJ SOLN
5000.0000 [IU] | Freq: Three times a day (TID) | INTRAMUSCULAR | Status: DC
  Administered 2021-07-26 – 2021-08-02 (×20): 5000 [IU] via SUBCUTANEOUS
  Filled 2021-07-26 (×20): qty 1

## 2021-07-26 MED ORDER — SODIUM CHLORIDE 0.9 % IV SOLN
1.0000 g | INTRAVENOUS | Status: AC
  Administered 2021-07-26 – 2021-07-30 (×5): 1 g via INTRAVENOUS
  Filled 2021-07-26 (×5): qty 10

## 2021-07-26 MED ORDER — METOPROLOL TARTRATE 25 MG PO TABS
25.0000 mg | ORAL_TABLET | Freq: Two times a day (BID) | ORAL | Status: DC
  Administered 2021-07-26: 25 mg via ORAL
  Filled 2021-07-26 (×2): qty 1

## 2021-07-26 MED ORDER — SODIUM BICARBONATE 650 MG PO TABS
650.0000 mg | ORAL_TABLET | Freq: Every day | ORAL | Status: DC
  Administered 2021-07-26 – 2021-08-02 (×8): 650 mg via ORAL
  Filled 2021-07-26 (×9): qty 1

## 2021-07-26 MED ORDER — LOSARTAN POTASSIUM 25 MG PO TABS
50.0000 mg | ORAL_TABLET | Freq: Every day | ORAL | Status: DC
  Administered 2021-07-26 – 2021-07-27 (×2): 50 mg via ORAL
  Filled 2021-07-26 (×2): qty 2

## 2021-07-26 MED ORDER — MORPHINE SULFATE (PF) 2 MG/ML IV SOLN
2.0000 mg | Freq: Once | INTRAVENOUS | Status: AC
  Administered 2021-07-26: 2 mg via INTRAVENOUS
  Filled 2021-07-26: qty 1

## 2021-07-26 MED ORDER — GUAIFENESIN ER 600 MG PO TB12
600.0000 mg | ORAL_TABLET | Freq: Two times a day (BID) | ORAL | Status: DC
  Administered 2021-07-26 – 2021-08-02 (×14): 600 mg via ORAL
  Filled 2021-07-26 (×14): qty 1

## 2021-07-26 MED ORDER — SODIUM CHLORIDE 0.9 % IV BOLUS
250.0000 mL | Freq: Once | INTRAVENOUS | Status: AC
  Administered 2021-07-27: 250 mL via INTRAVENOUS

## 2021-07-26 MED ORDER — TECHNETIUM TO 99M ALBUMIN AGGREGATED
4.2000 | Freq: Once | INTRAVENOUS | Status: AC
  Administered 2021-07-26: 4.2 via INTRAVENOUS

## 2021-07-26 MED ORDER — TAMSULOSIN HCL 0.4 MG PO CAPS
0.4000 mg | ORAL_CAPSULE | Freq: Every day | ORAL | Status: DC
  Administered 2021-07-26 – 2021-08-02 (×8): 0.4 mg via ORAL
  Filled 2021-07-26 (×8): qty 1

## 2021-07-26 NOTE — ED Provider Notes (Signed)
[facility] DEPT Provider Note   CSN: [id number] Arrival date & time: 07/25/21  1304     History Chief Complaint  Patient presents with   Back Pain    Bianca White is a 75 y.o. female.  Patient complains of left lateral and left mid back discomfort with inspiration.  Patient has had a mild cough no fevers no chills  The history is provided by the patient and medical records. No language interpreter was used.  Back Pain Pain location: Left flank pain. Quality:  Aching Radiates to:  Does not radiate Pain severity:  Moderate Pain is:  Worse during the day Onset quality:  Sudden Associated symptoms: no abdominal pain, no chest pain and no headaches       Past Medical History:  Diagnosis Date   Carotid artery disease ([place])    Nonobstructive; < 50% bilateral ICA stenosis, 07/2007.   Coronary atherosclerosis of native coronary artery    75% distal LAD, 75% diagonal, DES to mid RCA - March 2014   CVA (cerebral infarction)    Essential hypertension, benign    Hyperlipidemia    Paroxysmal atrial fibrillation (HCC)    Detected on tele [facility] 3/14    Skin abnormality    right wrist rea scab area   Stroke [facility])    Type 2 diabetes mellitus [facility])     Patient Active Problem List   Diagnosis Date Noted   Expected blood loss anemia 11/27/2014   Morbid obesity ([place]) 11/25/2014   S/P left THA, AA 11/24/2014   Atherosclerosis of native arteries of the extremities with intermittent claudication 12/09/2013   Encounter for therapeutic drug monitoring 10/14/2013   Edema 09/16/2013   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[place]) 01/10/2013   Neuralgia, neuritis, and radiculitis, unspecified 01/10/2013   Long term (current) use of anticoagulants 01/07/2013   Hyperlipidemia 11/26/2012   History of CVA (cerebrovascular accident) 11/26/2012   Paroxysmal atrial fibrillation ([place]) 11/26/2012   CAD (coronary artery disease), native coronary artery  11/26/2012   Type 2 diabetes mellitus ([place]) 11/26/2012   Normocytic anemia 11/26/2012   Essential hypertension, benign     Past Surgical History:  Procedure Laterality Date   Bilateral carpal tunnel release     Cervical neck fusion     CHOLECYSTECTOMY     HERNIA REPAIR     LEFT HEART CATHETERIZATION WITH CORONARY ANGIOGRAM N/A 11/25/2012   Procedure: LEFT HEART CATHETERIZATION WITH CORONARY ANGIOGRAM;  Surgeon: Hillary Bow, MD;  Location: [facility] CATH LAB;  Service: Cardiovascular;  Laterality: N/A;   PERCUTANEOUS CORONARY STENT INTERVENTION (PCI-S)  11/25/2012   Procedure: PERCUTANEOUS CORONARY STENT INTERVENTION (PCI-S);  Surgeon: Hillary Bow, MD;  Location: [facility] CATH LAB;  Service: Cardiovascular;;   TOTAL HIP ARTHROPLASTY Left 11/24/2014   Procedure: LEFT TOTAL HIP ARTHROPLASTY ANTERIOR APPROACH;  Surgeon: Paralee Cancel, MD;  Location: [facility] ORS;  Service: Orthopedics;  Laterality: Left;     OB History   No obstetric history on file.     Family History  Problem Relation Age of Onset   Stroke Father    Stroke Mother     Social History   Tobacco Use   Smoking status: Never   Smokeless tobacco: Never  Substance Use Topics   Alcohol use: No   Drug use: No    Home Medications Prior to Admission medications   Medication Sig Start Date End Date Taking? Authorizing Provider  atenolol (TENORMIN) 25 MG tablet Take 1 tablet (25 mg total)  by mouth 2 (two) times daily. 08/17/14   Satira Sark, MD  cholecalciferol (VITAMIN D) 1000 UNITS tablet Take 1,000 Units by mouth daily.    [provider]  docusate sodium (COLACE) 100 MG capsule Take 1 capsule (100 mg total) by mouth 2 (two) times daily. 11/27/14   Danae Orleans, PA-C  febuxostat (ULORIC) 40 MG tablet Take 40 mg by mouth daily as needed (Gout).     [provider]  fish oil-omega-3 fatty acids 1000 MG capsule Take 1 g by mouth daily.    [provider]  furosemide (LASIX) 40 MG tablet Take 1  tablet (40 mg total) by mouth every other day. Patient taking differently: Take 40 mg by mouth daily as needed.  06/24/14   Satira Sark, MD  glipiZIDE (GLUCOTROL) 5 MG tablet Take 5 mg by mouth daily before breakfast.    [provider]  HYDROcodone-acetaminophen (NORCO) 7.5-325 MG per tablet Take 1-2 tablets by mouth every 4 (four) hours as needed for moderate pain. 11/27/14   Danae Orleans, PA-C  JANUMET 50-1000 MG tablet Take 1 tablet by mouth 2 (two) times daily with a meal.  06/14/15   [provider]  losartan (COZAAR) 50 MG tablet TAKE 1 TABLET BY MOUTH ONCE A DAY AS DIRECTED 04/19/15   [provider]  nitroGLYCERIN (NITROSTAT) 0.4 MG SL tablet Place 1 tablet (0.4 mg total) under the tongue every 5 (five) minutes x 3 doses as needed for chest pain. 12/05/13   Satira Sark, MD  omeprazole (PRILOSEC) 40 MG capsule  05/27/15   [provider]  polyethylene glycol (MIRALAX / GLYCOLAX) packet Take 17 g by mouth 2 (two) times daily. 11/27/14   Danae Orleans, PA-C  simvastatin (ZOCOR) 10 MG tablet Take 10 mg by mouth at bedtime.    [provider]  warfarin (COUMADIN) 5 MG tablet Take 0.5-1 tablets (2.5-5 mg total) by mouth daily. Monday, Wednesday, friday, and Sunday takes 5 mg. Tuesdays, Thursdays and Saturdays takes 2.5mg  11/27/14   Danae Orleans, PA-C    Allergies    Dilaudid [hydromorphone hcl] and Stadol [butorphanol]  Review of Systems   Review of Systems  Constitutional:  Negative for appetite change and fatigue.  HENT:  Negative for congestion, ear discharge and sinus pressure.   Eyes:  Negative for discharge.  Respiratory:  Negative for cough.   Cardiovascular:  Negative for chest pain.  Gastrointestinal:  Negative for abdominal pain and diarrhea.  Genitourinary:  Negative for frequency and hematuria.  Musculoskeletal:  Positive for back pain.  Skin:  Negative for rash.  Neurological:  Negative for seizures and headaches.   Psychiatric/Behavioral:  Negative for hallucinations.    Physical Exam Updated Vital Signs BP (!) 165/94   Pulse 73   Temp 97.8 F (36.6 C) (Oral)   Resp 18   Ht 5\' 10"  (1.778 m)   Wt 95.3 kg   SpO2 99%   BMI 30.13 kg/m   Physical Exam Vitals and nursing note reviewed.  Constitutional:      Appearance: She is well-developed.  HENT:     Head: Normocephalic.     Nose: Nose normal.  Eyes:     General: No scleral icterus.    Conjunctiva/sclera: Conjunctivae normal.  Neck:     Thyroid: No thyromegaly.  Cardiovascular:     Rate and Rhythm: Normal rate and regular rhythm.     Heart sounds: No murmur heard.   No friction rub. No gallop.  Pulmonary:     Breath sounds: No stridor. No wheezing or rales.  Chest:     Chest wall: No tenderness.  Abdominal:     General: There is no distension.     Tenderness: There is no abdominal tenderness. There is no rebound.  Musculoskeletal:        General: Normal range of motion.     Cervical back: Neck supple.  Lymphadenopathy:     Cervical: No cervical adenopathy.  Skin:    Findings: No erythema or rash.  Neurological:     Mental Status: She is alert and oriented to person, place, and time.     Motor: No abnormal muscle tone.     Coordination: Coordination normal.  Psychiatric:        Behavior: Behavior normal.    ED Results / Procedures / Treatments   Labs (all labs ordered are listed, but only abnormal results are displayed) Labs Reviewed  BASIC METABOLIC PANEL - Abnormal; Notable for the following components:      Result Value   Glucose, Bld 104 (*)    BUN 54 (*)    Creatinine, Ser 2.23 (*)    GFR, Estimated 22 (*)    All other components within normal limits  HEPATIC FUNCTION PANEL - Abnormal; Notable for the following components:   Bilirubin, Direct 0.3 (*)    All other components within normal limits  D-DIMER, QUANTITATIVE - Abnormal; Notable for the following components:   D-Dimer, Quant 1.62 (*)    All other  components within normal limits  CBG MONITORING, ED - Abnormal; Notable for the following components:   Glucose-Capillary 107 (*)    All other components within normal limits  CBC  PROTIME-INR    EKG None  Radiology DG Ribs Unilateral W/Chest Left  Result Date: 07/25/2021 CLINICAL DATA:  Left posterior rib/flank pain for 3 days EXAM: LEFT RIBS AND CHEST - 3+ VIEW COMPARISON:  Chest radiograph 05/04/2009 FINDINGS: The heart is enlarged. The mediastinal contours are within normal limits. Linear opacities in the left base likely reflect atelectasis or scar. There is no other focal consolidation. There is no pulmonary edema. There is no pleural effusion or pneumothorax. No displaced rib fracture or other acute osseous abnormality is identified. Cervical spine fusion hardware is noted. IMPRESSION: 1. No displaced rib fracture or other acute osseous abnormality identified. 2. Cardiomegaly. Electronically Signed   By: Valetta Mole M.D.   On: 07/25/2021 14:47   CT Chest Wo Contrast  Result Date: 07/25/2021 CLINICAL DATA:  Abnormal xray - lung nodule, < 1 cm, mod-high risk EXAM: CT CHEST WITHOUT CONTRAST TECHNIQUE: Multidetector CT imaging of the chest was performed following the standard protocol without IV contrast. COMPARISON:  Rib series today FINDINGS: Cardiovascular: Diffuse coronary artery calcifications and moderate aortic calcifications. Heart is mildly enlarged. No evidence of aortic aneurysm. Mediastinum/Nodes: No mediastinal, hilar, or axillary adenopathy. Trachea and esophagus are unremarkable. Thyroid unremarkable. Lungs/Pleura: Linear atelectasis or scarring at the left lung base. No confluent opacities or effusions. Few scattered ground-glass nodular areas in the left upper lobe/apex and in the right upper lobe, likely infectious/inflammatory small airways disease/alveolitis. Upper Abdomen: Imaging into the upper abdomen demonstrates no acute findings. Musculoskeletal: Chest wall soft  tissues are unremarkable. No acute bony abnormality. 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 Cardiomegaly, coronary artery disease. Aortic Atherosclerosis (ICD10-I70.0). Electronically Signed   By: Rolm Baptise M.D.   On: 07/25/2021 20:15  Procedures Procedures   Medications Ordered in ED Medications  HYDROcodone-acetaminophen (NORCO/VICODIN) 5-325 MG per tablet 1 tablet (1 tablet Oral Patient Refused/Not Given 07/25/21 1930)  acetaminophen (TYLENOL) tablet 650 mg (650 mg Oral Given 07/25/21 1517)  oxyCODONE-acetaminophen (PERCOCET/ROXICET) 5-325 MG per tablet 1 tablet (1 tablet Oral Given 07/25/21 1754)  ondansetron (ZOFRAN) injection 4 mg (4 mg Intravenous Given 07/25/21 1941)  sodium chloride 0.9 % bolus 500 mL (0 mLs Intravenous Stopped 07/25/21 2209)  sodium chloride 0.9 % bolus 500 mL (500 mLs Intravenous New Bag/Given 07/25/21 2316)    ED Course  I have reviewed the triage vital signs and the nursing notes.  Pertinent labs & imaging results that were available during my care of the patient were reviewed by me and considered in my medical decision making (see chart for details). Patient with pleuritic chest pain on the left.  And positive D-dimer.  Patient cannot get a CT angio.  I discussed the patient with the hospitalist and hospitalist recommending getting a VQ scan tomorrow morning along with Doppler studies and dispositioning the patient after that   MDM Rules/Calculators/A&P                           Pleuritic chest pain Final Clinical Impression(s) / ED Diagnoses Final diagnoses:  None    Rx / DC Orders ED Discharge Orders     None        Milton Ferguson, MD 07/29/21 0945

## 2021-07-26 NOTE — H&P (Signed)
History and Physical    Bianca White ZOX:[id number] DOB: September 28, 1945 DOA: 07/25/2021  PCP: Michell Heinrich, DO   Patient coming from: Home    Chief Complaint: Back pain, pleuritic chest pain  HPI: Bianca White is a 75 y.o. female with medical history significant of coronary artery disease status post stent placement to mid RCA, paroxysmal A. fib not on anticoagulation due to history of spinal cord bleed, CKD stage IV, history of CVA with residual right-sided weakness, intermittent bradycardia while on beta-blocker, insulin-dependent diabetes type 2, hypertension, hyperlipidemia who presented to the emergency department with complaint of back pain and pleuritic chest pain.  She was complaining of left lower back/flank pain and complained about radiation to the left abdominal area.  She also reported shortness of breath along with her left flank pain. Chest x-ray did not show any pneumonia.  CT chest without contrast showed few scattered small vague groundglass nodular density in both upper lobes consistent with small airway disease/alveolitis, cardiomegaly. She was found to have mildly  elevated D-dimer.  CT chest for PE could not be done due to CKD status.  VQ scan was obtained which did not show any ventilation/perfusion mismatch.  Patient was deferred admission by our team but as per ED physician, she persistently desaturated on room air requiring 2 L of oxygen.  Patient was admitted for further evaluation and management for hypoxia. She denies any fever, chills, chest pain, vomiting, diarrhea, dysuria.  She complains of COVID cough with orange colored sputum.  ED Course: Patient was complaining of left flank pain.  CT renal study showed small nonobstructing left renal calculus. No evidence of ureteral calculus or hydronephrosis,moderate bladder distension without apparent focal abnormality.  She remained mildly hypertensive during emergency department stay.  She was requiring 2 L of oxygen  to maintain her saturation.  Lab work showed creatinine of 2.23, currently around baseline.  D-dimer of 1.62 .she was rapidly desaturating without oxygen.  Review of Systems: As per HPI otherwise 10 point review of systems negative.    Past Medical History:  Diagnosis Date   Carotid artery disease ([place])    Nonobstructive; < 50% bilateral ICA stenosis, 07/2007.   Coronary atherosclerosis of native coronary artery    75% distal LAD, 75% diagonal, DES to mid RCA - March 2014   CVA (cerebral infarction)    Essential hypertension, benign    Hyperlipidemia    Paroxysmal atrial fibrillation ([place])    Detected on tele [facility] 3/14    Skin abnormality    right wrist rea scab area   Stroke ([place])    Type 2 diabetes mellitus ([place])     Past Surgical History:  Procedure Laterality Date   Bilateral carpal tunnel release     Cervical neck fusion     CHOLECYSTECTOMY     HERNIA REPAIR     LEFT HEART CATHETERIZATION WITH CORONARY ANGIOGRAM N/A 11/25/2012   Procedure: LEFT HEART CATHETERIZATION WITH CORONARY ANGIOGRAM;  Surgeon: Hillary Bow, MD;  Location: [facility] CATH LAB;  Service: Cardiovascular;  Laterality: N/A;   PERCUTANEOUS CORONARY STENT INTERVENTION (PCI-S)  11/25/2012   Procedure: PERCUTANEOUS CORONARY STENT INTERVENTION (PCI-S);  Surgeon: Hillary Bow, MD;  Location: [facility] C[facility] LAB;  Service: Cardiovascular;;   TOTAL HIP ARTHROPLASTY Left 11/24/2014   Procedure: LEFT TOTAL HIP ARTHROPLASTY ANTERIOR APPROACH;  Surgeon: Paralee Cancel, MD;  Location: [facility] ORS;  Service: Orthopedics;  Laterality: Left;     reports that she has never smoked. She has never used  smokeless tobacco. She reports that she does not drink alcohol and does not use drugs.  Allergies  Allergen Reactions   Ciprofloxacin Other (See Comments) and Palpitations   Insulin Detemir Swelling and Other (See Comments)   Dilaudid [Hydromorphone Hcl]     Unknown reaction   Nsaids     Due to CKD   Stadol [Butorphanol]     Went crazy  - hallucinations   Sulfa Antibiotics Other (See Comments)    Due to CKD    Family History  Problem Relation Age of Onset   Stroke Father    Stroke Mother      Prior to Admission medications   Medication Sig Start Date End Date Taking? Authorizing Provider  aspirin EC 81 MG tablet Take 81 mg by mouth daily. Swallow whole.   Yes [provider]  docusate sodium (COLACE) 100 MG capsule Take 1 capsule (100 mg total) by mouth 2 (two) times daily. Patient taking differently: Take 100 mg by mouth daily as needed for mild constipation. 11/27/14  Yes Babish, Rodman Key, PA-C  hydrALAZINE (APRESOLINE) 50 MG tablet Take 100 mg by mouth 2 (two) times daily. 06/08/21  Yes [provider]  losartan (COZAAR) 50 MG tablet Take 50 mg by mouth daily. 04/19/15  Yes [provider]  metoprolol tartrate (LOPRESSOR) 25 MG tablet Take 25 mg by mouth 2 (two) times daily. 02/04/21  Yes [provider]  simvastatin (ZOCOR) 10 MG tablet Take 10 mg by mouth at bedtime.   Yes [provider]  sodium bicarbonate 650 MG tablet Take 650 mg by mouth daily. 06/14/21  Yes [provider]  TRESIBA FLEXTOUCH 100 UNIT/ML FlexTouch Pen Inject 30 Units into the skin daily. 05/26/21  Yes [provider]  VITAMIN D PO Take 4,000 Units by mouth daily.   Yes [provider]    Physical Exam: Vitals:   07/26/21 1400 07/26/21 1415 07/26/21 1600 07/26/21 1602  BP: (!) 161/68 (!) 147/67 (!) 157/64   Pulse: (!) 53 70 62   Resp: 18 15 15    Temp:    97.9 F (36.6 C)  TempSrc:    Oral  SpO2: 98% 99% 100%   Weight:      Height:        Constitutional: NAD, calm, comfortable,obese Vitals:   07/26/21 1400 07/26/21 1415 07/26/21 1600 07/26/21 1602  BP: (!) 161/68 (!) 147/67 (!) 157/64   Pulse: (!) 53 70 62   Resp: 18 15 15    Temp:    97.9 F (36.6 C)  TempSrc:    Oral  SpO2: 98% 99% 100%   Weight:      Height:       Eyes: PERRL, lids and conjunctivae  normal ENMT: Mucous membranes are moist.  Neck: normal, supple, no masses, no thyromegaly Respiratory: Mildly diminished air sounds bilaterally, no wheezing, no crackles. Normal respiratory effort. No accessory muscle use.  Cardiovascular: Irregularly irregular rhythm, no murmurs / rubs / gallops. No extremity edema.  Abdomen: no tenderness, no masses palpated. No hepatosplenomegaly. Bowel sounds positive.  Musculoskeletal: no clubbing / cyanosis. No joint deformity upper and lower extremities.  2+ bilateral pitting edema Skin: no rashes,ulcers. No induration.  Multiple scattered purpuric lesions on the skin mainly on the abdomen Neurologic: CN 2-12 grossly intact.  Strength 5/5 in all 4.  Psychiatric: Normal judgment and insight. Alert and oriented x 3. Normal mood.   Foley Catheter:None  Labs on Admission: I have personally reviewed following labs and  imaging studies  CBC: Recent Labs  Lab 07/25/21 1802  WBC 5.9  HGB 12.0  HCT 38.3  MCV 92.3  PLT 409   Basic Metabolic Panel: Recent Labs  Lab 07/25/21 1802  NA 140  K 4.4  CL 105  CO2 28  GLUCOSE 104*  BUN 54*  CREATININE 2.23*  CALCIUM 9.5   GFR: Estimated Creatinine Clearance: 26.4 mL/min (A) (by C-G formula based on SCr of 2.23 mg/dL (H)). Liver Function Tests: Recent Labs  Lab 07/25/21 1846  AST 16  ALT 12  ALKPHOS 60  BILITOT 1.0  PROT 7.1  ALBUMIN 3.8   No results for input(s): LIPASE, AMYLASE in the last 168 hours. No results for input(s): AMMONIA in the last 168 hours. Coagulation Profile: Recent Labs  Lab 07/25/21 1846  INR 1.1   Cardiac Enzymes: No results for input(s): CKTOTAL, CKMB, CKMBINDEX, TROPONINI in the last 168 hours. BNP (last 3 results) No results for input(s): PROBNP in the last 8760 hours. HbA1C: No results for input(s): HGBA1C in the last 72 hours. CBG: Recent Labs  Lab 07/25/21 2312  GLUCAP 107*   Lipid Profile: No results for input(s): CHOL, HDL, LDLCALC, TRIG,  CHOLHDL, LDLDIRECT in the last 72 hours. Thyroid Function Tests: No results for input(s): TSH, T4TOTAL, FREET4, T3FREE, THYROIDAB in the last 72 hours. Anemia Panel: No results for input(s): VITAMINB12, FOLATE, FERRITIN, TIBC, IRON, RETICCTPCT in the last 72 hours. Urine analysis:    Component Value Date/Time   COLORURINE YELLOW 07/26/2021 0100   APPEARANCEUR CLEAR 07/26/2021 0100   LABSPEC 1.012 07/26/2021 0100   PHURINE 5.0 07/26/2021 0100   GLUCOSEU NEGATIVE 07/26/2021 0100   HGBUR NEGATIVE 07/26/2021 0100   BILIRUBINUR NEGATIVE 07/26/2021 0100   KETONESUR NEGATIVE 07/26/2021 0100   PROTEINUR 100 (A) 07/26/2021 0100   UROBILINOGEN 0.2 11/18/2014 1145   NITRITE NEGATIVE 07/26/2021 0100   LEUKOCYTESUR NEGATIVE 07/26/2021 0100    Radiological Exams on Admission: DG Ribs Unilateral W/Chest Left  Result Date: 07/25/2021 CLINICAL DATA:  Left posterior rib/flank pain for 3 days EXAM: LEFT RIBS AND CHEST - 3+ VIEW COMPARISON:  Chest radiograph 05/04/2009 FINDINGS: The heart is enlarged. The mediastinal contours are within normal limits. Linear opacities in the left base likely reflect atelectasis or scar. There is no other focal consolidation. There is no pulmonary edema. There is no pleural effusion or pneumothorax. No displaced rib fracture or other acute osseous abnormality is identified. Cervical spine fusion hardware is noted. IMPRESSION: 1. No displaced rib fracture or other acute osseous abnormality identified. 2. Cardiomegaly. Electronically Signed   By: Valetta Mole M.D.   On: 07/25/2021 14:47   CT Chest Wo Contrast  Result Date: 07/25/2021 CLINICAL DATA:  Abnormal xray - lung nodule, < 1 cm, mod-high risk EXAM: CT CHEST WITHOUT CONTRAST TECHNIQUE: Multidetector CT imaging of the chest was performed following the standard protocol without IV contrast. COMPARISON:  Rib series today FINDINGS: Cardiovascular: Diffuse coronary artery calcifications and moderate aortic calcifications.  Heart is mildly enlarged. No evidence of aortic aneurysm. Mediastinum/Nodes: No mediastinal, hilar, or axillary adenopathy. Trachea and esophagus are unremarkable. Thyroid unremarkable. Lungs/Pleura: Linear atelectasis or scarring at the left lung base. No confluent opacities or effusions. Few scattered ground-glass nodular areas in the left upper lobe/apex and in the right upper lobe, likely infectious/inflammatory small airways disease/alveolitis. Upper Abdomen: Imaging into the upper abdomen demonstrates no acute findings. Musculoskeletal: Chest wall soft tissues are unremarkable. No acute bony abnormality. 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 Cardiomegaly, coronary artery disease. Aortic Atherosclerosis (ICD10-I70.0). Electronically Signed   By: Rolm Baptise M.D.   On: 07/25/2021 20:15   NM PULMONARY VENT AND PERF (V/Q Scan)  Result Date: 07/26/2021 CLINICAL DATA:  75 year old female with shortness of breath and chest pain. History of blood clot. EXAM: NUCLEAR MEDICINE PERFUSION LUNG SCAN TECHNIQUE: Perfusion images were obtained in multiple projections after intravenous injection of radiopharmaceutical. Ventilation scans intentionally deferred if perfusion scan and chest x-ray adequate for interpretation during COVID 19 epidemic. RADIOPHARMACEUTICALS:  4.2 mCi Tc-93m MAA IV COMPARISON:  Noncontrast Chest CT yesterday. FINDINGS: Fairly homogeneous perfusion radiotracer activity in both lungs. Expected mediastinal photopenia. No convincing perfusion defect. IMPRESSION: Negative, no evidence of pulmonary embolus Electronically Signed   By: Genevie Ann M.D.   On: 07/26/2021 11:41   CT Renal Stone Study  Result Date: 07/26/2021 CLINICAL DATA:  Flank and back pain.  Kidney stone suspected. EXAM: CT ABDOMEN AND PELVIS WITHOUT CONTRAST TECHNIQUE: Multidetector CT imaging of the abdomen and pelvis was performed  following the standard protocol without IV contrast. COMPARISON:  Abdominopelvic CT 10/25/2005. Chest CT 07/25/2021. Abdominopelvic CT 09/18/2018 unavailable. FINDINGS: Lower chest: Cardiomegaly and atherosclerosis of the aorta and coronary arteries again noted. Trace pleural fluid on the right with patchy linear atelectasis or scarring in both lung bases, similar to recent chest CT. Hepatobiliary: No focal hepatic abnormalities are identified on noncontrast imaging. There is stable extrahepatic biliary dilatation post cholecystectomy, likely physiologic. No evidence of calcified intraductal calculus. Pancreas: Mildly atrophy. No evidence of pancreatic mass, pancreatic ductal dilatation or surrounding inflammation. Spleen: Normal in size without focal abnormality. Adrenals/Urinary Tract: Stable mild prominence of both adrenal glands and a 1.7 cm low-density left adrenal nodule consistent with an adenoma. No suspicious adrenal nodularity. Mild renal cortical thinning and perinephric soft tissue stranding bilaterally, similar to previous studies. There is a tiny nonobstructing calculus in the lower pole of the left kidney. No evidence of ureteral or bladder calculus. There is no hydronephrosis. There is a probable small exophytic cyst projecting from the lower pole of the left kidney. The bladder appears moderately distended without focal abnormality. Stomach/Bowel: No enteric contrast administered. The stomach appears unremarkable for its degree of distension. No evidence of bowel wall thickening, distention or surrounding inflammatory change. The appendix appears normal. There are diverticular changes throughout the colon, greatest within the sigmoid colon. Vascular/Lymphatic: There are no enlarged abdominal or pelvic lymph nodes. Aortic and branch vessel atherosclerosis without aneurysm. Reproductive: Scattered uterine calcifications, likely due to calcified fibroids. No suspicious adnexal findings. Other: Previous  ventral hernia repair without recurrent hernia, ascites or focal extraluminal fluid collection. Musculoskeletal: No acute or significant osseous findings. Grossly stable right paraspinal nodularity at L4-5. Mild multilevel lumbar spondylosis. Previous left total hip arthroplasty with moderate right hip degenerative changes. IMPRESSION: 1. Small nonobstructing left renal calculus. No evidence of ureteral calculus or hydronephrosis. 2. Moderate bladder distension without apparent focal abnormality. Chronic renal cortical thinning and perinephric soft tissue stranding bilaterally. 3. Diffuse colonic diverticulosis without evidence of recurrent acute inflammation. 4. Stable additional incidental findings including chronic biliary dilatation post cholecystectomy, previous ventral hernia repair, scattered small calcified uterine fibroids and Aortic Atherosclerosis (ICD10-I70.0). Electronically Signed   By: Richardean Sale M.D.   On: 07/26/2021 15:00   VAS [place] LOWER EXTREMITY VENOUS (DVT) (ONLY [facility] & [facility])  Result Date: 07/26/2021  Lower Venous DVT Study Patient Name:  Carlia A Gertsch  Date of  Exam:   07/26/2021 Medical Rec #: [id number]         Accession #:    [id number] Date of Birth: 08-Aug-1946         Patient Gender: F Patient Age:   71 years Exam Location:  [facility] Procedure:      VAS [place] LOWER EXTREMITY VENOUS (DVT) Referring Phys: JOSEPH ZAMMIT --------------------------------------------------------------------------------  Indications: Pain.  Risk Factors: None identified. Limitations: Body habitus and poor ultrasound/tissue interface. Comparison Study: No prior studies. Performing Technologist: Oliver Hum RVT  Examination Guidelines: A complete evaluation includes B-mode imaging, spectral Doppler, color Doppler, and power Doppler as needed of all accessible portions of each vessel. Bilateral testing is considered an integral part of a complete examination. Limited examinations for reoccurring  indications may be performed as noted. The reflux portion of the exam is performed with the patient in reverse Trendelenburg.  +---------+---------------+---------+-----------+----------+--------------+ RIGHT    CompressibilityPhasicitySpontaneityPropertiesThrombus Aging +---------+---------------+---------+-----------+----------+--------------+ CFV      Full           Yes      Yes                                 +---------+---------------+---------+-----------+----------+--------------+ SFJ      Full                                                        +---------+---------------+---------+-----------+----------+--------------+ FV Prox  Full                                                        +---------+---------------+---------+-----------+----------+--------------+ FV Mid                  Yes      Yes                                 +---------+---------------+---------+-----------+----------+--------------+ FV Distal               Yes      Yes                                 +---------+---------------+---------+-----------+----------+--------------+ PFV      Full                                                        +---------+---------------+---------+-----------+----------+--------------+ POP      Full           Yes      Yes                                 +---------+---------------+---------+-----------+----------+--------------+ PTV      Full                                                        +---------+---------------+---------+-----------+----------+--------------+  PERO     Full                                                        +---------+---------------+---------+-----------+----------+--------------+   +---------+---------------+---------+-----------+----------+--------------+ LEFT     CompressibilityPhasicitySpontaneityPropertiesThrombus Aging  +---------+---------------+---------+-----------+----------+--------------+ CFV      Full           Yes      Yes                                 +---------+---------------+---------+-----------+----------+--------------+ SFJ      Full                                                        +---------+---------------+---------+-----------+----------+--------------+ FV Prox  Full                                                        +---------+---------------+---------+-----------+----------+--------------+ FV Mid   Full                                                        +---------+---------------+---------+-----------+----------+--------------+ FV DistalFull                                                        +---------+---------------+---------+-----------+----------+--------------+ PFV      Full                                                        +---------+---------------+---------+-----------+----------+--------------+ POP      Full           Yes      Yes                                 +---------+---------------+---------+-----------+----------+--------------+ PTV      Full                                                        +---------+---------------+---------+-----------+----------+--------------+ PERO     Full                                                        +---------+---------------+---------+-----------+----------+--------------+  Summary: RIGHT: - There is no evidence of deep vein thrombosis in the lower extremity. However, portions of this examination were limited- see technologist comments above.  - No cystic structure found in the popliteal fossa.  LEFT: - There is no evidence of deep vein thrombosis in the lower extremity. However, portions of this examination were limited- see technologist comments above.  - No cystic structure found in the popliteal fossa.  *See table(s) above for measurements and observations.  Electronically signed by Deitra Mayo MD on 07/26/2021 at 1:56:12 PM.    Final      Assessment/Plan Principal Problem:   Acute respiratory failure with hypoxia ([place]) Active Problems:   Essential hypertension, benign   Hyperlipidemia   History of CVA (cerebrovascular accident)   Paroxysmal atrial fibrillation ([place])   CAD (coronary artery disease), native coronary artery   Acute respiratory failure with hypoxia: Currently unknown etiology.  Does not use oxygen at home.  She was complain of shortness of breath on minimal exertion.  Chest x-ray done on 07/25/2021 did not show any pneumonia or pulmonary edema.  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 VQ scan did not suggest PE.  Venous Doppler did not show any DVT. Will continue supplemental oxygen for now.  We will do physical therapy assessment.  We will empirically treat for community-acquired pneumonia, low suspicion for pneumonia is low.  The CT scan showed features of alveolitis .since VQ scan is negative, no indication for starting on anticoagulation.  She also has history of spinal cord bleeding, so she is not currently taking any anticoagulants for paroxysmal A. fib. Patient looks volume overloaded.  Will check BNP, if elevated, will start on IV Lasix which might help with oxygenation. Will repeat chest x-ray.  We will check echocardiogram.  Left flank pain: CT renal study showed small nonobstructing left renal calculus. No evidence of ureteral calculus.  Continue supportive care, pain management .  History of hypertension: Takes hydralazine, losartan, metoprolol at home.  We will continue this medication.  Monitor blood pressure  History of CKD stage IV: Currently kidney function at baseline.  Continue sodium bicarb tablets that she takes at home.  Monitor kidney function.She is s/p right radial aVF created 11/17/2020.  She  follows with [facility]  Diabetes type 2: On Tresiba at home.  Monitor blood sugars.  Continue sliding scale insulin here.  History of paroxysmal A. fib: Currently in normal sinus rhythm.  On metoprolol for rate control.  Does not take anticoagulant due to history of spinal cord bleeding  History of nonhemorrhagic stroke: Has residual weakness on the right side.  Takes aspirin at home.  History of hyperlipidemia: Takes simvastatin   Severity of Illness: The appropriate patient status for this patient is OBSERVATION. Observation status is judged to be reasonable and necessary in order to provide the required intensity of service to ensure the patient's safety. The patient's presenting symptoms, physical exam findings, and initial radiographic and laboratory data in the context of their medical condition is felt to place them at decreased risk for further clinical deterioration. Furthermore, it is anticipated that the patient will be medically stable for discharge from the hospital within 2 midnights of admission.    DVT prophylaxis: Heparin subcu Code Status: Full Family Communication: Husband at bedside Consults called: None     Shelly Coss MD [facility]  07/26/2021, 4:24 PM

## 2021-07-26 NOTE — ED Notes (Signed)
Xray at the bedside.

## 2021-07-26 NOTE — Progress Notes (Signed)
Bilateral lower extremity venous duplex has been completed. Preliminary results can be found in CV Proc through chart review.  Results were given to Dr. Kathrynn Humble.  07/26/21 10:02 AM Bianca White RVT

## 2021-07-26 NOTE — ED Notes (Signed)
Pt ambulated 35ft with two assist. O2 dropped down to 74% pulse rate elevated to 106.

## 2021-07-26 NOTE — ED Notes (Signed)
Pt back from CT at this time 

## 2021-07-26 NOTE — ED Notes (Signed)
Pt in [place] [facility] at this time.

## 2021-07-26 NOTE — ED Notes (Signed)
Pt in CT at this time.

## 2021-07-26 NOTE — Consult Note (Signed)
I was called for potential admission for this patient.   Ms. Bianca White is a 75 year old female with history of CAD s/p DES to mid RCA, paroxysmal atrial fibrillation (no longer on anticoagulation due to history of spinal cord bleed), CKD stage IV (s/p right radial aVF created 11/17/2020), history of CVA with residual right-sided weakness, intermittent bradycardia while on beta-blocker, insulin-dependent type 2 diabetes, hypertension, hyperlipidemia came to the ED for evaluation of back pain and pleuritic type chest discomfort.  Patient seen in the ED.  She was out walking back into her room from the bathroom with assistance from nurse while using a cane.  She is complaining of left lower back/flank pain.  She says this radiates to her left abdominal area.  She denies any chest pain to me.  She says she only feels short of breath when her left flank pain flares up.  She reports good urine output without dysuria.  She tells me that she has not been on anticoagulation for almost a decade due to a history of spinal bleed.  This is despite having prior strokes in the setting of atrial fibrillation.  Physical exam: General: Appears somewhat uncomfortable HEENT: PERRL, EOMI, no scleral icterus Cardiac: Irregularly irregular, no rubs, murmurs or gallops Pulm: clear to auscultation bilaterally, moving normal volumes of air Abd: soft, nontender, nondistended, BS present Ext: warm and well perfused, no pedal edema Neuro: alert and oriented X3, cranial nerves II-XII grossly intact  Left chest x-ray did not show displaced rib fracture or other acute osseous abnormality.  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  Cardiomegaly, CAD, aortic atherosclerosis noted.  Labs show BUN 54, creatinine 2.23 which are near patient's baseline.  CBC within normal limits.  D-dimer 1.62.  Due to elevated D-dimer  the hospitalist service was consulted for potential admission to obtain VQ scan.  At this time, feel that admission to obtain VQ scan is not warranted.  This can either be obtained as an outpatient or in the ED.  Would also consider lower extremity Dopplers if concern for PE as these can assess for DVT.  If she is found to have DVT or PE, then she will need serious discussions on risks versus benefits of anticoagulation given her history of spinal bleed while on Coumadin.  Can be considered for IVC filter.  That being said, patient's main complaint on my discussion is left flank pain.  Would recommend assessing for UTI and/or potential kidney stone or other intra-abdominal pathology versus musculoskeletal issue.  TR[facility] will sign off, please call us back if needed.  Zada Finders, MD [facility]

## 2021-07-26 NOTE — ED Provider Notes (Signed)
Care assumed from Dr. Roderic Palau, patient with pleuritic chest pain and elevated d-dimer. Creatinine is too high for CT angiogram, she is being held for V/Q scan and bilateral venous dopplers in the morning.  Urinalysis is come back negative for infection.  Case is signed out to Dr. Kathrynn Humble.   Delora Fuel, MD 91/67/56 [phone]

## 2021-07-26 NOTE — ED Notes (Signed)
Pt care taken, no complaints at this time, waiting for MRI

## 2021-07-26 NOTE — ED Notes (Addendum)
Pt back from Nuc Med at this time.

## 2021-07-26 NOTE — ED Notes (Signed)
Pt attached to cardiac monitor x3. A&O x4. VSS. NAD.  °

## 2021-07-26 NOTE — ED Provider Notes (Addendum)
  Physical Exam  BP (!) 147/67   Pulse 70   Temp 97.9 F (36.6 C) (Oral)   Resp 15   Ht 5\' 10"  (1.778 m)   Wt 95.3 kg   SpO2 99%   BMI 30.13 kg/m   Physical Exam  ED Course/Procedures     .Critical Care Performed by: Varney Biles, MD Authorized by: Varney Biles, MD   Critical care provider statement:    Critical care time (minutes):  30   Critical care was necessary to treat or prevent imminent or life-threatening deterioration of the following conditions:  Respiratory failure   Critical care was time spent personally by me on the following activities:  Development of treatment plan with patient or surrogate, discussions with consultants, evaluation of patient's response to treatment, examination of patient, ordering and review of laboratory studies, ordering and review of radiographic studies, ordering and performing treatments and interventions, pulse oximetry, re-evaluation of patient's condition and review of old charts  MDM  Assuming care of patient from Dr. Roxanne Mins.   Patient in the ED for for flank pain with elevated dimer and elevated Cr   Concerning findings are as following : none Important pending results are VQ scan and US dopplers.  According to Dr. Roxanne Mins, plan is to d/c the patients if the workup is neg.   Patient had no complains, no concerns from the nursing side. Will continue to monitor.    Varney Biles, MD 07/26/21 0730  Reassessment: Patient reassessed after her ultrasound and VQ scan, both of which were negative. She continues to complain of left-sided flank pain.  She also reports right-sided hip pain, for which she is supposed to get arthroplasty on 22nd. No abdominal pain on my exam. She has tenderness over the left hip, right femur.  I will add a CT abdomen and pelvis without contrast however my suspicion for occult fracture is low.  Patient's O2 sats was 85% on room air when I turn off the oxygen.  Normally she is not on any  oxygen. Although VQ scan is negative, she appears to have acute hypoxia.  Upon ambulation, patient's O2 sats dropped to the 70s.  She will need admission for acute hypoxia.   Varney Biles, MD 07/26/21 1434

## 2021-07-26 NOTE — ED Notes (Signed)
Pt's BG 84 at this time. Pt given 8oz apple juice. Hospitalist notified. No additional orders at this time. Will re-assess.

## 2021-07-27 ENCOUNTER — Other Ambulatory Visit: Payer: Self-pay

## 2021-07-27 ENCOUNTER — Observation Stay (HOSPITAL_COMMUNITY): Payer: Medicare Other

## 2021-07-27 DIAGNOSIS — Z882 Allergy status to sulfonamides status: Secondary | ICD-10-CM | POA: Diagnosis not present

## 2021-07-27 DIAGNOSIS — I517 Cardiomegaly: Secondary | ICD-10-CM | POA: Diagnosis not present

## 2021-07-27 DIAGNOSIS — Z881 Allergy status to other antibiotic agents status: Secondary | ICD-10-CM | POA: Diagnosis not present

## 2021-07-27 DIAGNOSIS — Z20822 Contact with and (suspected) exposure to covid-19: Secondary | ICD-10-CM | POA: Diagnosis present

## 2021-07-27 DIAGNOSIS — Z6839 Body mass index (BMI) 39.0-39.9, adult: Secondary | ICD-10-CM | POA: Diagnosis not present

## 2021-07-27 DIAGNOSIS — K5900 Constipation, unspecified: Secondary | ICD-10-CM | POA: Diagnosis present

## 2021-07-27 DIAGNOSIS — I69351 Hemiplegia and hemiparesis following cerebral infarction affecting right dominant side: Secondary | ICD-10-CM | POA: Diagnosis not present

## 2021-07-27 DIAGNOSIS — J984 Other disorders of lung: Secondary | ICD-10-CM | POA: Diagnosis present

## 2021-07-27 DIAGNOSIS — Z886 Allergy status to analgesic agent status: Secondary | ICD-10-CM | POA: Diagnosis not present

## 2021-07-27 DIAGNOSIS — I2510 Atherosclerotic heart disease of native coronary artery without angina pectoris: Secondary | ICD-10-CM | POA: Diagnosis present

## 2021-07-27 DIAGNOSIS — I272 Pulmonary hypertension, unspecified: Secondary | ICD-10-CM | POA: Diagnosis present

## 2021-07-27 DIAGNOSIS — N184 Chronic kidney disease, stage 4 (severe): Secondary | ICD-10-CM | POA: Diagnosis present

## 2021-07-27 DIAGNOSIS — J9601 Acute respiratory failure with hypoxia: Secondary | ICD-10-CM | POA: Diagnosis present

## 2021-07-27 DIAGNOSIS — E785 Hyperlipidemia, unspecified: Secondary | ICD-10-CM | POA: Diagnosis present

## 2021-07-27 DIAGNOSIS — I480 Paroxysmal atrial fibrillation: Secondary | ICD-10-CM | POA: Diagnosis present

## 2021-07-27 DIAGNOSIS — Z955 Presence of coronary angioplasty implant and graft: Secondary | ICD-10-CM | POA: Diagnosis not present

## 2021-07-27 DIAGNOSIS — N179 Acute kidney failure, unspecified: Secondary | ICD-10-CM | POA: Diagnosis present

## 2021-07-27 DIAGNOSIS — D631 Anemia in chronic kidney disease: Secondary | ICD-10-CM | POA: Diagnosis present

## 2021-07-27 DIAGNOSIS — I130 Hypertensive heart and chronic kidney disease with heart failure and stage 1 through stage 4 chronic kidney disease, or unspecified chronic kidney disease: Secondary | ICD-10-CM | POA: Diagnosis present

## 2021-07-27 DIAGNOSIS — R109 Unspecified abdominal pain: Secondary | ICD-10-CM | POA: Diagnosis present

## 2021-07-27 DIAGNOSIS — I501 Left ventricular failure: Secondary | ICD-10-CM | POA: Diagnosis present

## 2021-07-27 DIAGNOSIS — R7989 Other specified abnormal findings of blood chemistry: Secondary | ICD-10-CM | POA: Diagnosis present

## 2021-07-27 DIAGNOSIS — Z888 Allergy status to other drugs, medicaments and biological substances status: Secondary | ICD-10-CM | POA: Diagnosis not present

## 2021-07-27 DIAGNOSIS — E1122 Type 2 diabetes mellitus with diabetic chronic kidney disease: Secondary | ICD-10-CM | POA: Diagnosis present

## 2021-07-27 DIAGNOSIS — E669 Obesity, unspecified: Secondary | ICD-10-CM | POA: Diagnosis present

## 2021-07-27 DIAGNOSIS — N200 Calculus of kidney: Secondary | ICD-10-CM | POA: Diagnosis present

## 2021-07-27 LAB — BASIC METABOLIC PANEL
Anion gap: 8 (ref 5–15)
BUN: 52 mg/dL — ABNORMAL HIGH (ref 8–23)
CO2: 25 mmol/L (ref 22–32)
Calcium: 9.1 mg/dL (ref 8.9–10.3)
Chloride: 109 mmol/L (ref 98–111)
Creatinine, Ser: 2.29 mg/dL — ABNORMAL HIGH (ref 0.44–1.00)
GFR, Estimated: 21 mL/min — ABNORMAL LOW (ref >60)
Glucose, Bld: 148 mg/dL — ABNORMAL HIGH (ref 70–99)
Potassium: 4.5 mmol/L (ref 3.5–5.1)
Sodium: 142 mmol/L (ref 135–145)

## 2021-07-27 LAB — CBC
HCT: 35.6 % — ABNORMAL LOW (ref 36.0–46.0)
Hemoglobin: 10.6 g/dL — ABNORMAL LOW (ref 12.0–15.0)
MCH: 28.3 pg (ref 26.0–34.0)
MCHC: 29.8 g/dL — ABNORMAL LOW (ref 30.0–36.0)
MCV: 94.9 fL (ref 80.0–100.0)
Platelets: 157 10*3/uL (ref 150–400)
RBC: 3.75 MIL/uL — ABNORMAL LOW (ref 3.87–5.11)
RDW: 14.6 % (ref 11.5–15.5)
WBC: 7 10*3/uL (ref 4.0–10.5)
nRBC: 0 % (ref 0.0–0.2)

## 2021-07-27 LAB — CBG MONITORING, ED
Glucose-Capillary: 125 mg/dL — ABNORMAL HIGH (ref 70–99)
Glucose-Capillary: 171 mg/dL — ABNORMAL HIGH (ref 70–99)
Glucose-Capillary: 101 mg/dL — ABNORMAL HIGH (ref 70–99)

## 2021-07-27 LAB — ECHOCARDIOGRAM COMPLETE
AR max vel: 2.62 cm2
AV Area VTI: 2.81 cm2
AV Area mean vel: 2.53 cm2
AV Mean grad: 4.7 mmHg
AV Peak grad: 9.6 mmHg
Ao pk vel: 1.55 m/s
Area-P 1/2: 3.43 cm2
Height: 70 in
S' Lateral: 2.8 cm
Weight: 3360 oz

## 2021-07-27 LAB — GLUCOSE, CAPILLARY: Glucose-Capillary: 140 mg/dL — ABNORMAL HIGH (ref 70–99)

## 2021-07-27 MED ORDER — ACETAMINOPHEN 325 MG PO TABS
650.0000 mg | ORAL_TABLET | Freq: Four times a day (QID) | ORAL | Status: AC | PRN
  Administered 2021-07-27: 650 mg via ORAL
  Filled 2021-07-27: qty 2

## 2021-07-27 MED ORDER — FUROSEMIDE 10 MG/ML IJ SOLN
60.0000 mg | Freq: Two times a day (BID) | INTRAMUSCULAR | Status: DC
Start: 2021-07-27 — End: 2021-07-27
  Administered 2021-07-27: 60 mg via INTRAVENOUS
  Filled 2021-07-27: qty 8

## 2021-07-27 MED ORDER — METOPROLOL TARTRATE 25 MG PO TABS
25.0000 mg | ORAL_TABLET | Freq: Two times a day (BID) | ORAL | Status: DC
  Administered 2021-07-27 – 2021-07-28 (×2): 25 mg via ORAL
  Filled 2021-07-27 (×3): qty 1

## 2021-07-27 MED ORDER — OXYCODONE HCL 5 MG PO TABS
5.0000 mg | ORAL_TABLET | Freq: Four times a day (QID) | ORAL | Status: DC | PRN
  Administered 2021-07-27 – 2021-08-01 (×14): 5 mg via ORAL
  Filled 2021-07-27 (×15): qty 1

## 2021-07-27 MED ORDER — HYDRALAZINE HCL 50 MG PO TABS
50.0000 mg | ORAL_TABLET | Freq: Two times a day (BID) | ORAL | Status: DC
  Administered 2021-07-27: 50 mg via ORAL
  Filled 2021-07-27 (×2): qty 1

## 2021-07-27 MED ORDER — ONDANSETRON HCL 4 MG/2ML IJ SOLN
4.0000 mg | Freq: Four times a day (QID) | INTRAMUSCULAR | Status: AC | PRN
  Administered 2021-07-27 – 2021-07-28 (×2): 4 mg via INTRAVENOUS
  Filled 2021-07-27 (×2): qty 2

## 2021-07-27 MED ORDER — LOSARTAN POTASSIUM 25 MG PO TABS
25.0000 mg | ORAL_TABLET | Freq: Every day | ORAL | Status: DC
  Filled 2021-07-27: qty 1

## 2021-07-27 MED ORDER — PERFLUTREN LIPID MICROSPHERE
1.0000 mL | INTRAVENOUS | Status: AC | PRN
Start: 2021-07-27 — End: 2021-07-27
  Administered 2021-07-27: 2 mL via INTRAVENOUS
  Filled 2021-07-27: qty 10

## 2021-07-27 MED ORDER — FUROSEMIDE 10 MG/ML IJ SOLN
80.0000 mg | Freq: Two times a day (BID) | INTRAMUSCULAR | Status: DC
Start: 2021-07-27 — End: 2021-07-28
  Administered 2021-07-27: 80 mg via INTRAVENOUS
  Filled 2021-07-27: qty 8

## 2021-07-27 NOTE — ED Notes (Signed)
Patient used the bedside commode with the assistance of 2 nurses. Patient is very short of breath and winded when ambulating small distances. Patient is back in bed. 2L [place] continued.

## 2021-07-27 NOTE — ED Notes (Signed)
Patient became hypotensive and started complaining of nausea. Bianca [name]. NP was contacted and orders placed for nausea medication and fluids. Patients IV was no longer working. IV team consult placed and new IV started. Patient is not in bed, medications given. BP 121/63 (80)

## 2021-07-27 NOTE — Evaluation (Addendum)
Physical Therapy Evaluation Patient Details Name: Bianca [name] MRN: [id number] DOB: [birth year] Today's Date: 07/27/2021  History of Present Illness  Bianca [name] is a 75 y.o. female with medical history significant of coronary artery disease status post stent placement to mid RCA, paroxysmal A. fib not on anticoagulation due to history of spinal cord bleed, CKD stage IV, history of CVA with residual right-sided weakness, intermittent bradycardia while on beta-blocker, insulin-dependent diabetes type 2, hypertension, hyperlipidemia who presented to the emergency department with complaint of back pain and pleuritic chest pain. Negative for PE, CT -ground glass opacities  Clinical Impression  The patient reports resides alone, uses cane.  Patient currently required +2 mod assistance for mobility and transfer to [facility] with difficulty getting back onto gurney due to height.  Patient may progress to return home if has 24/7 caregivers.  Patient's BP 116/43 HR 71, SPO2 1005 on 2 L. Pt admitted with above diagnosis.  Pt currently with functional limitations due to the deficits listed below (see PT Problem List). Pt will benefit from skilled PT to increase their independence and safety with mobility to allow discharge to the venue listed below.        Recommendations for follow up therapy are one component of a multi-disciplinary discharge planning process, led by the attending physician.  Recommendations may be updated based on patient status, additional functional criteria and insurance authorization.  Follow Up Recommendations Skilled nursing-short term rehab (<3 hours/day)    Assistance Recommended at Discharge Frequent or constant Supervision/Assistance  Functional Status Assessment Patient has had a recent decline in their functional status and demonstrates the ability to make significant improvements in function in a reasonable and predictable amount of time.  Equipment Recommendations  None  recommended by PT    Recommendations for Other Services       Precautions / Restrictions Precautions Precautions: Fall      Mobility  Bed Mobility Overal bed mobility: Needs Assistance Bed Mobility: Supine to Sit;Sit to Supine     Supine to sit: Mod assist Sit to supine: Max assist;+2 for safety/equipment   General bed mobility comments: mod assist with trunk. patient   moved legs to bed edge. max of 2 to return to supine, patient short instature and unable to push self back onto gurney    Transfers Overall transfer level: Needs assistance Equipment used: 2 person hand held assist Transfers: Sit to/from Stand;Bed to chair/wheelchair/BSC Sit to Stand: +2 physical assistance;Mod assist   Step pivot transfers: Mod assist;+2 physical assistance       General transfer comment: patient required steady support to stand, extra time and support to step to [facility] then back to gurney.    Ambulation/Gait               General Gait Details: TBA  Stairs            Wheelchair Mobility    Modified Rankin (Stroke Patients Only)       Balance Overall balance assessment: Needs assistance Sitting-balance support: Feet supported;Bilateral upper extremity supported Sitting balance-Leahy Scale: Fair     Standing balance support: During functional activity;Single extremity supported Standing balance-Leahy Scale: Poor Standing balance comment: reliant on UE support                             Pertinent Vitals/Pain Pain Assessment: Faces Faces Pain Scale: Hurts even more Pain Location: under left lower ribs when moving Pain Descriptors /  Indicators: Grimacing;Guarding;Pressure;Sharp Pain Intervention(s): Limited activity within patient's tolerance;Monitored during session    Home Living Family/patient expects to be discharged to:: Private residence Living Arrangements: Alone Available Help at Discharge: Family;Friend(s);Available PRN/intermittently Type  of Home: House Home Access: Stairs to enter   [employer] of Steps: 1   Home Layout: One level Home Equipment: Cane - single point Additional Comments: patient states friend assisting at times    Prior Function Prior Level of Function : Independent/Modified Independent  Reports sleeps in recliner frequently              Mobility Comments: uses cane, independnet in home ADLs Comments: independnet     Hand Dominance        Extremity/Trunk Assessment   Upper Extremity Assessment Upper Extremity Assessment: RUE deficits/detail RUE Deficits / Details: mild decreased fine motor    Lower Extremity Assessment Lower Extremity Assessment: Generalized weakness    Cervical / Trunk Assessment Cervical / Trunk Assessment: Normal  Communication   Communication: No difficulties  Cognition Arousal/Alertness: Awake/alert Behavior During Therapy: WFL for tasks assessed/performed Overall Cognitive Status: Within Functional Limits for tasks assessed                                          General Comments      Exercises     Assessment/Plan    PT Assessment Patient needs continued PT services  PT Problem List Decreased strength;Decreased mobility;Decreased safety awareness;Decreased knowledge of precautions;Decreased activity tolerance;Decreased balance       PT Treatment Interventions DME instruction;Therapeutic activities;Gait training;Therapeutic exercise;Patient/family education;Functional mobility training;Balance training    PT Goals (Current goals can be found in the Care Plan section)  Acute Rehab PT Goals Patient Stated Goal: to get better PT Goal Formulation: With patient Time For Goal Achievement: 08/10/21 Potential to Achieve Goals: Fair    Frequency Min 2X/week   Barriers to discharge Decreased caregiver support      Co-evaluation               AM-PAC PT "6 Clicks" Mobility  Outcome Measure Help needed turning  from your back to your side while in a flat bed without using bedrails?: A Lot Help needed moving from lying on your back to sitting on the side of a flat bed without using bedrails?: A Lot Help needed moving to and from a bed to a chair (including a wheelchair)?: Total Help needed standing up from a chair using your arms (e.g., wheelchair or bedside chair)?: Total Help needed to walk in hospital room?: Total Help needed climbing 3-5 steps with a railing? : Total 6 Click Score: 8    End of Session   Activity Tolerance: Patient limited by fatigue Patient left: in bed;with call bell/phone within reach;with family/visitor present Nurse Communication: Mobility status PT Visit Diagnosis: Unsteadiness on feet (R26.81);Difficulty in walking, not elsewhere classified (R26.2)    Time: 1610-9604 PT Time Calculation (min) (ACUTE ONLY): 30 min   Charges:   PT Evaluation $PT Eval Low Complexity: 1 Low PT Treatments $Self Care/Home Management: [place] Pager [phone] Office [phone]   Claretha Cooper 07/27/2021, 2:45 PM

## 2021-07-27 NOTE — Consult Note (Signed)
[facility] Renal Consultation Note    Indication for Consultation:  Management of CKD4; fluid overload  GLO:VFIEP, Quillian Quince, DO  HPI: Bianca [name] is a 75 y.o. female with a past medical history of Chronic Kidney Disease Stage 4 and we were consulted. She also has a past medical history significant for hypertension, type 2 diabetes, hyperlipidemia, stroke, and paroxysmal afib. She presented to the ED on 11/14 with SHOB, left flank pain, left lower chest pains worse upon inspiration.  ED initial VS O2 89%, RR 17, HR 54, BP 152/69. ED labwork revealed Hgb 12.0, Na 140, K 4.4, BUN 54 (baseline 44-47 in 2022), Crt 2.23 (baseline 2.01-2.70 in 2022), GFR 22. Urinalysis revealed proteinuria. ECG revealed Afib. Respiratory panel negative. CXR revealed cardiomegaly, no active disease. CT Chest WO Contrast revealed scattered small ground-glass nodular densities in bilateral UL, cardiomegaly and CAD. NM Pulmonary negative for PE. CT Renal Stone revealed nonobstructing left renal calculus w/o evidnce of ureteral calculus or hydronephrosis, moderate bladder distension, chronic renal cortical thinning and bilatral perinephric soft tissue stranding.   Pt was diagnosed with CKD4 in the setting of T2DM and HTN in 05/2020. She had a right AVF created on 11/17/20, angiogram/angioplasty performed on 03/17/21 and 06/27/21 due to poor maturation. She has upcoming appointment with her nephrologist on 12/13.    Pt seen this morning resting in the bed. O2 100% on 2L [place]. Pt is normally not on O2 at home. She did not appear to be in acute distress. She reports that her CKD is being managed by her nephrologist in [place] every 3 months. She reports still having left flank pain, left lower chest pains worse upon inspiration, and coughing up thick orange-colored sputum. She did not report any other acute complaints.   Past Medical History:  Diagnosis Date   Carotid artery disease ([place])    Nonobstructive; < 50%  bilateral ICA stenosis, 07/2007.   Coronary atherosclerosis of native coronary artery    75% distal LAD, 75% diagonal, DES to mid RCA - March 2014   CVA (cerebral infarction)    Essential hypertension, benign    Hyperlipidemia    Paroxysmal atrial fibrillation ([place])    Detected on tele [facility] 3/14    Skin abnormality    right wrist rea scab area   Stroke ([place])    Type 2 diabetes mellitus ([place])    Past Surgical History:  Procedure Laterality Date   Bilateral carpal tunnel release     Cervical neck fusion     CHOLECYSTECTOMY     HERNIA REPAIR     LEFT HEART CATHETERIZATION WITH CORONARY ANGIOGRAM N/A 11/25/2012   Procedure: LEFT HEART CATHETERIZATION WITH CORONARY ANGIOGRAM;  Surgeon: Hillary Bow, MD;  Location: [facility] CATH LAB;  Service: Cardiovascular;  Laterality: N/A;   PERCUTANEOUS CORONARY STENT INTERVENTION (PCI-S)  11/25/2012   Procedure: PERCUTANEOUS CORONARY STENT INTERVENTION (PCI-S);  Surgeon: Hillary Bow, MD;  Location: [facility] CATH LAB;  Service: Cardiovascular;;   TOTAL HIP ARTHROPLASTY Left 11/24/2014   Procedure: LEFT TOTAL HIP ARTHROPLASTY ANTERIOR APPROACH;  Surgeon: Paralee Cancel, MD;  Location: [facility] ORS;  Service: Orthopedics;  Laterality: Left;   Family History  Problem Relation Age of Onset   Stroke Father    Stroke Mother    Social History:  reports that she has never smoked. She has never used smokeless tobacco. She reports that she does not drink alcohol and does not use drugs. Allergies  Allergen Reactions   Ciprofloxacin Other (See Comments)  and Palpitations   Insulin Detemir Swelling and Other (See Comments)   Dilaudid [Hydromorphone Hcl]     Unknown reaction   Nsaids     Due to CKD   Stadol [Butorphanol]     Went crazy - hallucinations   Sulfa Antibiotics Other (See Comments)    Due to CKD   Prior to Admission medications   Medication Sig Start Date End Date Taking? Authorizing Provider  aspirin EC 81 MG tablet Take 81 mg by mouth daily. Swallow  whole.   Yes [provider]  docusate sodium (COLACE) 100 MG capsule Take 1 capsule (100 mg total) by mouth 2 (two) times daily. Patient taking differently: Take 100 mg by mouth daily as needed for mild constipation. 11/27/14  Yes Babish, Rodman Key, PA-C  hydrALAZINE (APRESOLINE) 50 MG tablet Take 100 mg by mouth 2 (two) times daily. 06/08/21  Yes [provider]  losartan (COZAAR) 50 MG tablet Take 50 mg by mouth daily. 04/19/15  Yes [provider]  metoprolol tartrate (LOPRESSOR) 25 MG tablet Take 25 mg by mouth 2 (two) times daily. 02/04/21  Yes [provider]  simvastatin (ZOCOR) 10 MG tablet Take 10 mg by mouth at bedtime.   Yes [provider]  sodium bicarbonate 650 MG tablet Take 650 mg by mouth daily. 06/14/21  Yes [provider]  TRESIBA FLEXTOUCH 100 UNIT/ML FlexTouch Pen Inject 30 Units into the skin daily. 05/26/21  Yes [provider]  VITAMIN D PO Take 4,000 Units by mouth daily.   Yes [provider]   Current Facility-Administered Medications  Medication Dose Route Frequency Provider Last Rate Last Admin   acetaminophen-codeine (TYLENOL #3) 300-30 MG per tablet 1 tablet  1 tablet Oral Once Shelly Coss, MD       aspirin EC tablet 81 mg  81 mg Oral Daily Adhikari, Amrit, MD   81 mg at 07/27/21 0915   azithromycin (ZITHROMAX) tablet 500 mg  500 mg Oral Daily Adhikari, Amrit, MD   500 mg at 07/26/21 1756   cefTRIAXone (ROCEPHIN) 1 g in sodium chloride 0.9 % 100 mL IVPB  1 g Intravenous Q24H Shelly Coss, MD   Stopped at 07/26/21 2018   furosemide (LASIX) injection 60 mg  60 mg Intravenous Q12H Adhikari, Amrit, MD   60 mg at 07/27/21 0914   furosemide (LASIX) injection 80 mg  80 mg Intravenous Once Shelly Coss, MD       guaiFENesin (MUCINEX) 12 hr tablet 600 mg  600 mg Oral BID Shelly Coss, MD   600 mg at 07/27/21 0913   heparin injection 5,000 Units  5,000 Units Subcutaneous Q8H Adhikari, Amrit, MD    5,000 Units at 07/27/21 0530   hydrALAZINE (APRESOLINE) tablet 100 mg  100 mg Oral BID Shelly Coss, MD   100 mg at 07/27/21 1638   HYDROcodone-acetaminophen (NORCO/VICODIN) 5-325 MG per tablet 1 tablet  1 tablet Oral Once Adhikari, Amrit, MD       insulin aspart (novoLOG) injection 0-5 Units  0-5 Units Subcutaneous QHS Adhikari, Amrit, MD       insulin aspart (novoLOG) injection 0-9 Units  0-9 Units Subcutaneous TID WC Shelly Coss, MD   1 Units at 07/27/21 0926   losartan (COZAAR) tablet 50 mg  50 mg Oral Daily Shelly Coss, MD   50 mg at 07/27/21 0913   metoprolol tartrate (LOPRESSOR) tablet 25 mg  25 mg Oral BID Shelly Coss, MD   25 mg at 07/26/21 2134  oxyCODONE (Oxy IR/ROXICODONE) immediate release tablet 5 mg  5 mg Oral Q6H PRN Shelly Coss, MD   5 mg at 07/27/21 1024   simvastatin (ZOCOR) tablet 10 mg  10 mg Oral QHS Shelly Coss, MD   10 mg at 07/26/21 2134   sodium bicarbonate tablet 650 mg  650 mg Oral Daily Shelly Coss, MD   650 mg at 07/27/21 1024   tamsulosin (FLOMAX) capsule 0.4 mg  0.4 mg Oral Daily Shelly Coss, MD   0.4 mg at 07/27/21 0913   Current Outpatient Medications  Medication Sig Dispense Refill   aspirin EC 81 MG tablet Take 81 mg by mouth daily. Swallow whole.     docusate sodium (COLACE) 100 MG capsule Take 1 capsule (100 mg total) by mouth 2 (two) times daily. (Patient taking differently: Take 100 mg by mouth daily as needed for mild constipation.) 10 capsule 0   hydrALAZINE (APRESOLINE) 50 MG tablet Take 100 mg by mouth 2 (two) times daily.     losartan (COZAAR) 50 MG tablet Take 50 mg by mouth daily.  3   metoprolol tartrate (LOPRESSOR) 25 MG tablet Take 25 mg by mouth 2 (two) times daily.     simvastatin (ZOCOR) 10 MG tablet Take 10 mg by mouth at bedtime.     sodium bicarbonate 650 MG tablet Take 650 mg by mouth daily.     TRESIBA FLEXTOUCH 100 UNIT/ML FlexTouch Pen Inject 30 Units into the skin daily.     VITAMIN D PO Take 4,000  Units by mouth daily.     Labs: Basic Metabolic Panel: Recent Labs  Lab 07/25/21 1802 07/26/21 1653 07/27/21 0408  NA 140 140 142  K 4.4 4.8 4.5  CL 105 107 109  CO2 28 26 25   GLUCOSE 104* 113* 148*  BUN 54* 51* 52*  CREATININE 2.23* 2.37* 2.29*  CALCIUM 9.5 9.3 9.1   Liver Function Tests: Recent Labs  Lab 07/25/21 1846  AST 16  ALT 12  ALKPHOS 60  BILITOT 1.0  PROT 7.1  ALBUMIN 3.8    CBC: Recent Labs  Lab 07/25/21 1802 07/27/21 0408  WBC 5.9 7.0  HGB 12.0 10.6*  HCT 38.3 35.6*  MCV 92.3 94.9  PLT 195 157   CBG: Recent Labs  Lab 07/26/21 1700 07/26/21 1802 07/26/21 2111 07/27/21 0811 07/27/21 1207  GLUCAP 84 107* 114* 125* 171*   Studies/Results: DG Ribs Unilateral W/Chest Left  Result Date: 07/25/2021 CLINICAL DATA:  Left posterior rib/flank pain for 3 days EXAM: LEFT RIBS AND CHEST - 3+ VIEW COMPARISON:  Chest radiograph 05/04/2009 FINDINGS: The heart is enlarged. The mediastinal contours are within normal limits. Linear opacities in the left base likely reflect atelectasis or scar. There is no other focal consolidation. There is no pulmonary edema. There is no pleural effusion or pneumothorax. No displaced rib fracture or other acute osseous abnormality is identified. Cervical spine fusion hardware is noted. IMPRESSION: 1. No displaced rib fracture or other acute osseous abnormality identified. 2. Cardiomegaly. Electronically Signed   By: Valetta Mole M.D.   On: 07/25/2021 14:47   CT Chest Wo Contrast  Result Date: 07/25/2021 CLINICAL DATA:  Abnormal xray - lung nodule, < 1 cm, mod-high risk EXAM: CT CHEST WITHOUT CONTRAST TECHNIQUE: Multidetector CT imaging of the chest was performed following the standard protocol without IV contrast. COMPARISON:  Rib series today FINDINGS: Cardiovascular: Diffuse coronary artery calcifications and moderate aortic calcifications. Heart is mildly enlarged. No evidence of aortic aneurysm. Mediastinum/Nodes: No  mediastinal, hilar, or axillary adenopathy. Trachea and esophagus are unremarkable. Thyroid unremarkable. Lungs/Pleura: Linear atelectasis or scarring at the left lung base. No confluent opacities or effusions. Few scattered ground-glass nodular areas in the left upper lobe/apex and in the right upper lobe, likely infectious/inflammatory small airways disease/alveolitis. Upper Abdomen: Imaging into the upper abdomen demonstrates no acute findings. Musculoskeletal: Chest wall soft tissues are unremarkable. No acute bony abnormality. 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 Cardiomegaly, coronary artery disease. Aortic Atherosclerosis (ICD10-I70.0). Electronically Signed   By: Rolm Baptise M.D.   On: 07/25/2021 20:15   NM PULMONARY VENT AND PERF (V/Q Scan)  Result Date: 07/26/2021 CLINICAL DATA:  75 year old female with shortness of breath and chest pain. History of blood clot. EXAM: NUCLEAR MEDICINE PERFUSION LUNG SCAN TECHNIQUE: Perfusion images were obtained in multiple projections after intravenous injection of radiopharmaceutical. Ventilation scans intentionally deferred if perfusion scan and chest x-ray adequate for interpretation during COVID 19 epidemic. RADIOPHARMACEUTICALS:  4.2 mCi Tc-78m MAA IV COMPARISON:  Noncontrast Chest CT yesterday. FINDINGS: Fairly homogeneous perfusion radiotracer activity in both lungs. Expected mediastinal photopenia. No convincing perfusion defect. IMPRESSION: Negative, no evidence of pulmonary embolus Electronically Signed   By: Genevie Ann M.D.   On: 07/26/2021 11:41   DG CHEST PORT 1 VIEW  Result Date: 07/26/2021 CLINICAL DATA:  Shortness of breath. EXAM: PORTABLE CHEST 1 VIEW COMPARISON:  Chest x-ray 07/25/2021. FINDINGS: The heart is enlarged. The lungs are clear. There is no pleural effusion or pneumothorax. No acute fractures are seen. Cervical spinal fusion plate is  present. IMPRESSION: Stable cardiomegaly.  No active disease. Electronically Signed   By: Ronney Asters M.D.   On: 07/26/2021 17:08   CT Renal Stone Study  Result Date: 07/26/2021 CLINICAL DATA:  Flank and back pain.  Kidney stone suspected. EXAM: CT ABDOMEN AND PELVIS WITHOUT CONTRAST TECHNIQUE: Multidetector CT imaging of the abdomen and pelvis was performed following the standard protocol without IV contrast. COMPARISON:  Abdominopelvic CT 10/25/2005. Chest CT 07/25/2021. Abdominopelvic CT 09/18/2018 unavailable. FINDINGS: Lower chest: Cardiomegaly and atherosclerosis of the aorta and coronary arteries again noted. Trace pleural fluid on the right with patchy linear atelectasis or scarring in both lung bases, similar to recent chest CT. Hepatobiliary: No focal hepatic abnormalities are identified on noncontrast imaging. There is stable extrahepatic biliary dilatation post cholecystectomy, likely physiologic. No evidence of calcified intraductal calculus. Pancreas: Mildly atrophy. No evidence of pancreatic mass, pancreatic ductal dilatation or surrounding inflammation. Spleen: Normal in size without focal abnormality. Adrenals/Urinary Tract: Stable mild prominence of both adrenal glands and a 1.7 cm low-density left adrenal nodule consistent with an adenoma. No suspicious adrenal nodularity. Mild renal cortical thinning and perinephric soft tissue stranding bilaterally, similar to previous studies. There is a tiny nonobstructing calculus in the lower pole of the left kidney. No evidence of ureteral or bladder calculus. There is no hydronephrosis. There is a probable small exophytic cyst projecting from the lower pole of the left kidney. The bladder appears moderately distended without focal abnormality. Stomach/Bowel: No enteric contrast administered. The stomach appears unremarkable for its degree of distension. No evidence of bowel wall thickening, distention or surrounding inflammatory change. The appendix  appears normal. There are diverticular changes throughout the colon, greatest within the sigmoid colon. Vascular/Lymphatic: There are no enlarged abdominal or pelvic lymph nodes. Aortic and branch vessel atherosclerosis without aneurysm. Reproductive: Scattered uterine calcifications, likely due to calcified fibroids. No suspicious adnexal findings. Other:  Previous ventral hernia repair without recurrent hernia, ascites or focal extraluminal fluid collection. Musculoskeletal: No acute or significant osseous findings. Grossly stable right paraspinal nodularity at L4-5. Mild multilevel lumbar spondylosis. Previous left total hip arthroplasty with moderate right hip degenerative changes. IMPRESSION: 1. Small nonobstructing left renal calculus. No evidence of ureteral calculus or hydronephrosis. 2. Moderate bladder distension without apparent focal abnormality. Chronic renal cortical thinning and perinephric soft tissue stranding bilaterally. 3. Diffuse colonic diverticulosis without evidence of recurrent acute inflammation. 4. Stable additional incidental findings including chronic biliary dilatation post cholecystectomy, previous ventral hernia repair, scattered small calcified uterine fibroids and Aortic Atherosclerosis (ICD10-I70.0). Electronically Signed   By: Richardean Sale M.D.   On: 07/26/2021 15:00   VAS [place] LOWER EXTREMITY VENOUS (DVT) (ONLY [facility] & [facility])  Result Date: 07/26/2021  Lower Venous DVT Study Patient Name:  Bianca [name]  Date of Exam:   07/26/2021 Medical Rec #: [id number]         Accession #:    [id number] Date of Birth: 12-24-1945         Patient Gender: F Patient Age:   78 years Exam Location:  [facility] Procedure:      VAS [place] LOWER EXTREMITY VENOUS (DVT) Referring Phys: JOSEPH ZAMMIT --------------------------------------------------------------------------------  Indications: Pain.  Risk Factors: None identified. Limitations: Body habitus and poor ultrasound/tissue interface.  Comparison Study: No prior studies. Performing Technologist: Oliver Hum RVT  Examination Guidelines: A complete evaluation includes B-mode imaging, spectral Doppler, color Doppler, and power Doppler as needed of all accessible portions of each vessel. Bilateral testing is considered an integral part of a complete examination. Limited examinations for reoccurring indications may be performed as noted. The reflux portion of the exam is performed with the patient in reverse Trendelenburg.  +---------+---------------+---------+-----------+----------+--------------+ RIGHT    CompressibilityPhasicitySpontaneityPropertiesThrombus Aging +---------+---------------+---------+-----------+----------+--------------+ CFV      Full           Yes      Yes                                 +---------+---------------+---------+-----------+----------+--------------+ SFJ      Full                                                        +---------+---------------+---------+-----------+----------+--------------+ FV Prox  Full                                                        +---------+---------------+---------+-----------+----------+--------------+ FV Mid                  Yes      Yes                                 +---------+---------------+---------+-----------+----------+--------------+ FV Distal               Yes      Yes                                 +---------+---------------+---------+-----------+----------+--------------+  PFV      Full                                                        +---------+---------------+---------+-----------+----------+--------------+ POP      Full           Yes      Yes                                 +---------+---------------+---------+-----------+----------+--------------+ PTV      Full                                                        +---------+---------------+---------+-----------+----------+--------------+ PERO      Full                                                        +---------+---------------+---------+-----------+----------+--------------+   +---------+---------------+---------+-----------+----------+--------------+ LEFT     CompressibilityPhasicitySpontaneityPropertiesThrombus Aging +---------+---------------+---------+-----------+----------+--------------+ CFV      Full           Yes      Yes                                 +---------+---------------+---------+-----------+----------+--------------+ SFJ      Full                                                        +---------+---------------+---------+-----------+----------+--------------+ FV Prox  Full                                                        +---------+---------------+---------+-----------+----------+--------------+ FV Mid   Full                                                        +---------+---------------+---------+-----------+----------+--------------+ FV DistalFull                                                        +---------+---------------+---------+-----------+----------+--------------+ PFV      Full                                                        +---------+---------------+---------+-----------+----------+--------------+  POP      Full           Yes      Yes                                 +---------+---------------+---------+-----------+----------+--------------+ PTV      Full                                                        +---------+---------------+---------+-----------+----------+--------------+ PERO     Full                                                        +---------+---------------+---------+-----------+----------+--------------+     Summary: RIGHT: - There is no evidence of deep vein thrombosis in the lower extremity. However, portions of this examination were limited- see technologist comments above.  - No cystic structure found in  the popliteal fossa.  LEFT: - There is no evidence of deep vein thrombosis in the lower extremity. However, portions of this examination were limited- see technologist comments above.  - No cystic structure found in the popliteal fossa.  *See table(s) above for measurements and observations. Electronically signed by Deitra Mayo MD on 07/26/2021 at 1:56:12 PM.    Final     ROS: Denies CP, no joint pain, no HA, no blurry vision, no rash, no diarrhea, no nausea/vomiting, no dysuria, no difficulty voiding.    Physical Exam: Vitals:   07/27/21 0900 07/27/21 0925 07/27/21 0942 07/27/21 1030  BP: (!) 117/42 (!) 117/42  (!) 119/42  Pulse: (!) 47  (!) 55 62  Resp: 13   20  Temp:      TempSrc:      SpO2: 100%   100%  Weight:      Height:         General: WDWN NAD Head: NCAT sclera not icteric MMM Neck: Supple. No lymphadenopathy Lungs: Decreased breath sounds bilaterally, No wheeze, rales or rhonchi. Breathing is unlabored. Heart: RRR. No murmur, rubs or gallops, no JVD.  Abdomen: soft, nontender, nondistended, +BS, M/S:  Equal strength b/l in upper and lower extremities.  Lower extremities: 1+ pretibial edema in LLE, 2+ pretibial and hip edema in RLE. Hyperpigmentation in RLE. Neuro: AAOx3. Moves all extremities spontaneously. Psych:  Responds to questions appropriately with a normal affect.   Assessment/Plan: CKD4-  BUN 52 (baseline 44-47 in 2022), Crt 2.29 (baseline 2.01-2.70 in 2022), GFR 21 on 11/16. Electrolytes currently stable. Trend BMP daily.  Followup with [facility] upon discharge.  Acute respiratory failure with hypoxia- CXR revealed no acute processes. BNP 378.9 on 11/15. Continue O2 [place].   Volume overload- as noted on PE. She refused Lasix per her nephrologist. We educated pt on the importance of using Lasix to aid in relieving volume overload and was in agreement to IV Lasix twice daily. Hypertension- BP soft currently. Hold parameters and decrease dose on Losartan,  Hydralazine while giving Lasix. Hold parameters on metoprolol.  Hx of PAF- bradycardia during encounter. Hold parameters on metoprolol. 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  Hx of CVA- some  right sided residual weakness, uses a cane. Continue Aspirin. Hyperlipidemia- Continue Simvastatin  Zacharia Chipps, PA-S [facility] Physician Assistant Studies 07/27/2021, 12:20 PM    Pt seen, examined and agree w assess/plan as above with additions as indicated.  [facility] 07/27/2021, 4:04 PM

## 2021-07-27 NOTE — ED Notes (Signed)
Pt resting comfortably in bed at this time, all comfort ans safety measures in place with bed in lowest position.

## 2021-07-27 NOTE — Progress Notes (Signed)
PROGRESS NOTE    Bianca [name]  [name] DOB: [name] DOA: 07/25/2021 PCP: Bianca Heinrich, DO   Chief Complain:Right flank pain  Brief Narrative: Bianca [name] is a 75 y.o. female with medical history significant of coronary artery disease status post stent placement to mid RCA, paroxysmal A. fib not on anticoagulation due to history of spinal cord bleed, CKD stage IV, history of CVA with residual right-sided weakness, intermittent bradycardia while on beta-blocker, insulin-dependent diabetes type 2, hypertension, hyperlipidemia who presented to the emergency department with complaint of back pain and pleuritic chest pain.  She was complaining of left lower back/flank pain and complained about radiation to the left abdominal area.  She also reported shortness of breath along with her left flank pain. Chest x-ray did not show any pneumonia.  CT chest without contrast showed few scattered small vague groundglass nodular density in both upper lobes consistent with small airway disease/alveolitis, cardiomegaly. She was found to have mildly  elevated D-dimer.  CT chest for PE could not be done due to CKD status.  VQ scan was obtained which did not show any ventilation/perfusion mismatch.  Patient was deferred admission by our team but as per ED physician, she persistently desaturated on room air requiring 2 L of oxygen.  Patient was admitted for further evaluation and management for hypoxia.  Assessment & Plan:   Principal Problem:   Acute respiratory failure with hypoxia (HCC) Active Problems:   Essential hypertension, benign   Hyperlipidemia   History of CVA (cerebrovascular accident)   Paroxysmal atrial fibrillation (HCC)   CAD (coronary artery disease), native coronary artery  Acute respiratory failure with hypoxia: Currently unknown etiology.  Does not use oxygen at home.  She was complain of shortness of breath on minimal exertion.  Chest x-ray done on 07/25/2021 did not show  any pneumonia or pulmonary edema.  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 VQ scan did not suggest PE.  Venous Doppler did not show any DVT. Will continue supplemental oxygen for now.  We will do physical therapy assessment.  We are  empirically treating for community-acquired pneumonia, though suspicion for pneumonia is low.  The CT scan showed features of alveolitis .since VQ scan is negative, no indication for starting on anticoagulation.  She also has history of spinal cord bleeding, so she is not currently taking any anticoagulants for paroxysmal A. fib. Patient looks volume overloaded.  BNP elevated,started on IV Lasix which might help with oxygenation. repeat chest x-ray did not show any acute findings.  We are checking echocardiogram.   Left flank pain: CT renal study showed small nonobstructing left renal calculus. No evidence of ureteral calculus.  Continue supportive care, pain management .Started on tamsulosin.   History of hypertension: Takes hydralazine, losartan, metoprolol at home.  We will continue this medication.  Monitor blood pressure   History of CKD stage IV: Currently kidney function at baseline.  Continue sodium bicarb tablets that she takes at home.  Monitor kidney function.She is s/p right radial aVF created 11/17/2020.  She follows with [facility].  Nephrology consulted   Diabetes type 2: On Tresiba at home.  Monitor blood sugars.  Continue sliding scale insulin here.   History of paroxysmal A. fib:  On metoprolol for rate control.  Does not take anticoagulant due to history of spinal cord bleeding   History of nonhemorrhagic stroke: Has residual weakness on the right side.  Takes  aspirin at home.   History of hyperlipidemia: Takes simvastatin  Obesity: BMI of 30          DVT prophylaxis:Heparin [place] Code Status: Full Family Communication: Husband  at bedside on 07/26/21 Status is: Observation     Consultants: Nephrology   Procedures:None  Antimicrobials:  Anti-infectives (From admission, onward)    Start     Dose/Rate Route Frequency Ordered Stop   07/26/21 1700  azithromycin (ZITHROMAX) tablet 500 mg        500 mg Oral Daily 07/26/21 1653     07/26/21 1700  cefTRIAXone (ROCEPHIN) 1 g in sodium chloride 0.9 % 100 mL IVPB        1 g 200 mL/hr over 30 Minutes Intravenous Every 24 hours 07/26/21 1653         Subjective:  Patient seen and examined the bedside this morning.  Hemodynamically stable.  She was on 2 L of oxygen per minute.  Still complaining of mild left-sided flank pain.   Objective: Vitals:   07/27/21 0900 07/27/21 0925 07/27/21 0942 07/27/21 1030  BP: (!) 117/42 (!) 117/42  (!) 119/42  Pulse: (!) 47  (!) 55 62  Resp: 13   20  Temp:      TempSrc:      SpO2: 100%   100%  Weight:      Height:        Intake/Output Summary (Last 24 hours) at 07/27/2021 1310 Last data filed at 07/27/2021 0251 Gross per 24 hour  Intake --  Output 675 ml  Net -675 ml   Filed Weights   07/25/21 1321  Weight: 95.3 kg    Examination:  General exam: Overall comfortable, not in distress,obese HEENT: PERRL Respiratory system: Mild diminished air sounds bilaterally, no wheezes or crackles  Cardiovascular system: Irregularly irregular rhythm.  Gastrointestinal system: Abdomen is nondistended, soft and nontender. Central nervous system: Alert and oriented Extremities: Bilateral lower extremity edema, no clubbing ,no cyanosis Skin: No rashes, no ulcers,no icterus       Data Reviewed: I have personally reviewed following labs and imaging studies  CBC: Recent Labs  Lab 07/25/21 1802 07/27/21 0408  WBC 5.9 7.0  HGB 12.0 10.6*  HCT 38.3 35.6*  MCV 92.3 94.9  PLT 195 637   Basic Metabolic Panel: Recent Labs  Lab 07/25/21 1802 07/26/21 1653 07/27/21 0408  NA 140 140 142  K 4.4 4.8 4.5  CL 105 107 109   CO2 28 26 25   GLUCOSE 104* 113* 148*  BUN 54* 51* 52*  CREATININE 2.23* 2.37* 2.29*  CALCIUM 9.5 9.3 9.1   GFR: Estimated Creatinine Clearance: 25.7 mL/min (A) (by C-G formula based on SCr of 2.29 mg/dL (H)). Liver Function Tests: Recent Labs  Lab 07/25/21 1846  AST 16  ALT 12  ALKPHOS 60  BILITOT 1.0  PROT 7.1  ALBUMIN 3.8   No results for input(s): LIPASE, AMYLASE in the last 168 hours. No results for input(s): AMMONIA in the last 168 hours. Coagulation Profile: Recent Labs  Lab 07/25/21 1846  INR 1.1   Cardiac Enzymes: No results for input(s): CKTOTAL, CKMB, CKMBINDEX, TROPONINI in the last 168 hours. BNP (last 3 results) No results for input(s): PROBNP in the last 8760 hours. HbA1C: Recent Labs    07/25/21 1802  HGBA1C 6.0*   CBG: Recent Labs  Lab 07/26/21 1700 07/26/21 1802 07/26/21 2111 07/27/21 0811 07/27/21 1207  GLUCAP 84 107* 114* 125* 171*   Lipid Profile: No results for input(s):  CHOL, HDL, LDLCALC, TRIG, CHOLHDL, LDLDIRECT in the last 72 hours. Thyroid Function Tests: No results for input(s): TSH, T4TOTAL, FREET4, T3FREE, THYROIDAB in the last 72 hours. Anemia Panel: No results for input(s): VITAMINB12, FOLATE, FERRITIN, TIBC, IRON, RETICCTPCT in the last 72 hours. Sepsis Labs: No results for input(s): PROCALCITON, LATICACIDVEN in the last 168 hours.  Recent Results (from the past 240 hour(s))  Resp Panel by RT-PCR (Flu A&B, Covid) Nasopharyngeal Swab     Status: None   Collection Time: 07/26/21  1:00 PM   Specimen: Nasopharyngeal Swab; Nasopharyngeal(NP) swabs in vial transport medium  Result Value Ref Range Status   SARS Coronavirus 2 by RT PCR NEGATIVE NEGATIVE Final    Comment: (NOTE) SARS-CoV-2 target nucleic acids are NOT DETECTED.  The SARS-CoV-2 RNA is generally detectable in upper respiratory specimens during the acute phase of infection. The lowest concentration of SARS-CoV-2 viral copies this assay can detect is 138  copies/mL. A negative result does not preclude SARS-Cov-2 infection and should not be used as the sole basis for treatment or other patient management decisions. A negative result may occur with  improper specimen collection/handling, submission of specimen other than nasopharyngeal swab, presence of viral mutation(s) within the areas targeted by this assay, and inadequate number of viral copies(<138 copies/mL). A negative result must be combined with clinical observations, patient history, and epidemiological information. The expected result is Negative.  Fact Sheet for Patients:  EntrepreneurPulse.com.au  Fact Sheet for Healthcare Providers:  IncredibleEmployment.be  This test is no t yet approved or cleared by the [place] FDA and  has been authorized for detection and/or diagnosis of SARS-CoV-2 by FDA under an Emergency Use Authorization (EUA). This EUA will remain  in effect (meaning this test can be used) for the duration of the COVID-19 declaration under Section 564(b)(1) of the Act, 21 U.S.C.section 360bbb-3(b)(1), unless the authorization is terminated  or revoked sooner.       Influenza A by PCR NEGATIVE NEGATIVE Final   Influenza B by PCR NEGATIVE NEGATIVE Final    Comment: (NOTE) The Xpert Xpress SARS-CoV-2/FLU/RSV plus assay is intended as an aid in the diagnosis of influenza from Nasopharyngeal swab specimens and should not be used as a sole basis for treatment. Nasal washings and aspirates are unacceptable for Xpert Xpress SARS-CoV-2/FLU/RSV testing.  Fact Sheet for Patients: EntrepreneurPulse.com.au  Fact Sheet for Healthcare Providers: IncredibleEmployment.be  This test is not yet approved or cleared by the [place] FDA and has been authorized for detection and/or diagnosis of SARS-CoV-2 by FDA under an Emergency Use Authorization (EUA). This EUA will remain in effect (meaning  this test can be used) for the duration of the COVID-19 declaration under Section 564(b)(1) of the Act, 21 U.S.C. section 360bbb-3(b)(1), unless the authorization is terminated or revoked.  Performed at [facility], [place] [address], [place], [place] [address]          Radiology Studies: DG Ribs Unilateral W/Chest Left  Result Date: 07/25/2021 CLINICAL DATA:  Left posterior rib/flank pain for 3 days EXAM: LEFT RIBS AND CHEST - 3+ VIEW COMPARISON:  Chest radiograph 05/04/2009 FINDINGS: The heart is enlarged. The mediastinal contours are within normal limits. Linear opacities in the left base likely reflect atelectasis or scar. There is no other focal consolidation. There is no pulmonary edema. There is no pleural effusion or pneumothorax. No displaced rib fracture or other acute osseous abnormality is identified. Cervical spine fusion hardware is noted. IMPRESSION: 1. No displaced rib fracture or  other acute osseous abnormality identified. 2. Cardiomegaly. Electronically Signed   By: Valetta Mole M.D.   On: 07/25/2021 14:47   CT Chest Wo Contrast  Result Date: 07/25/2021 CLINICAL DATA:  Abnormal xray - lung nodule, < 1 cm, mod-high risk EXAM: CT CHEST WITHOUT CONTRAST TECHNIQUE: Multidetector CT imaging of the chest was performed following the standard protocol without IV contrast. COMPARISON:  Rib series today FINDINGS: Cardiovascular: Diffuse coronary artery calcifications and moderate aortic calcifications. Heart is mildly enlarged. No evidence of aortic aneurysm. Mediastinum/Nodes: No mediastinal, hilar, or axillary adenopathy. Trachea and esophagus are unremarkable. Thyroid unremarkable. Lungs/Pleura: Linear atelectasis or scarring at the left lung base. No confluent opacities or effusions. Few scattered ground-glass nodular areas in the left upper lobe/apex and in the right upper lobe, likely infectious/inflammatory small airways disease/alveolitis. Upper Abdomen:  Imaging into the upper abdomen demonstrates no acute findings. Musculoskeletal: Chest wall soft tissues are unremarkable. No acute bony abnormality. 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 Cardiomegaly, coronary artery disease. Aortic Atherosclerosis (ICD10-I70.0). Electronically Signed   By: Rolm Baptise M.D.   On: 07/25/2021 20:15   NM PULMONARY VENT AND PERF (V/Q Scan)  Result Date: 07/26/2021 CLINICAL DATA:  75 year old female with shortness of breath and chest pain. History of blood clot. EXAM: NUCLEAR MEDICINE PERFUSION LUNG SCAN TECHNIQUE: Perfusion images were obtained in multiple projections after intravenous injection of radiopharmaceutical. Ventilation scans intentionally deferred if perfusion scan and chest x-ray adequate for interpretation during COVID 19 epidemic. RADIOPHARMACEUTICALS:  4.2 mCi Tc-3m MAA IV COMPARISON:  Noncontrast Chest CT yesterday. FINDINGS: Fairly homogeneous perfusion radiotracer activity in both lungs. Expected mediastinal photopenia. No convincing perfusion defect. IMPRESSION: Negative, no evidence of pulmonary embolus Electronically Signed   By: Genevie Ann M.D.   On: 07/26/2021 11:41   DG CHEST PORT 1 VIEW  Result Date: 07/26/2021 CLINICAL DATA:  Shortness of breath. EXAM: PORTABLE CHEST 1 VIEW COMPARISON:  Chest x-ray 07/25/2021. FINDINGS: The heart is enlarged. The lungs are clear. There is no pleural effusion or pneumothorax. No acute fractures are seen. Cervical spinal fusion plate is present. IMPRESSION: Stable cardiomegaly.  No active disease. Electronically Signed   By: Ronney Asters M.D.   On: 07/26/2021 17:08   CT Renal Stone Study  Result Date: 07/26/2021 CLINICAL DATA:  Flank and back pain.  Kidney stone suspected. EXAM: CT ABDOMEN AND PELVIS WITHOUT CONTRAST TECHNIQUE: Multidetector CT imaging of the abdomen and pelvis was performed following the standard  protocol without IV contrast. COMPARISON:  Abdominopelvic CT 10/25/2005. Chest CT 07/25/2021. Abdominopelvic CT 09/18/2018 unavailable. FINDINGS: Lower chest: Cardiomegaly and atherosclerosis of the aorta and coronary arteries again noted. Trace pleural fluid on the right with patchy linear atelectasis or scarring in both lung bases, similar to recent chest CT. Hepatobiliary: No focal hepatic abnormalities are identified on noncontrast imaging. There is stable extrahepatic biliary dilatation post cholecystectomy, likely physiologic. No evidence of calcified intraductal calculus. Pancreas: Mildly atrophy. No evidence of pancreatic mass, pancreatic ductal dilatation or surrounding inflammation. Spleen: Normal in size without focal abnormality. Adrenals/Urinary Tract: Stable mild prominence of both adrenal glands and a 1.7 cm low-density left adrenal nodule consistent with an adenoma. No suspicious adrenal nodularity. Mild renal cortical thinning and perinephric soft tissue stranding bilaterally, similar to previous studies. There is a tiny nonobstructing calculus in the lower pole of the left kidney. No evidence of ureteral or bladder calculus. There is no hydronephrosis. There is a probable small  exophytic cyst projecting from the lower pole of the left kidney. The bladder appears moderately distended without focal abnormality. Stomach/Bowel: No enteric contrast administered. The stomach appears unremarkable for its degree of distension. No evidence of bowel wall thickening, distention or surrounding inflammatory change. The appendix appears normal. There are diverticular changes throughout the colon, greatest within the sigmoid colon. Vascular/Lymphatic: There are no enlarged abdominal or pelvic lymph nodes. Aortic and branch vessel atherosclerosis without aneurysm. Reproductive: Scattered uterine calcifications, likely due to calcified fibroids. No suspicious adnexal findings. Other: Previous ventral hernia repair  without recurrent hernia, ascites or focal extraluminal fluid collection. Musculoskeletal: No acute or significant osseous findings. Grossly stable right paraspinal nodularity at L4-5. Mild multilevel lumbar spondylosis. Previous left total hip arthroplasty with moderate right hip degenerative changes. IMPRESSION: 1. Small nonobstructing left renal calculus. No evidence of ureteral calculus or hydronephrosis. 2. Moderate bladder distension without apparent focal abnormality. Chronic renal cortical thinning and perinephric soft tissue stranding bilaterally. 3. Diffuse colonic diverticulosis without evidence of recurrent acute inflammation. 4. Stable additional incidental findings including chronic biliary dilatation post cholecystectomy, previous ventral hernia repair, scattered small calcified uterine fibroids and Aortic Atherosclerosis (ICD10-I70.0). Electronically Signed   By: Richardean Sale M.D.   On: 07/26/2021 15:00   VAS [place] LOWER EXTREMITY VENOUS (DVT) (ONLY [facility] & [facility])  Result Date: 07/26/2021  Lower Venous DVT Study Patient Name:  LASASHA BROPHY Pound  Date of Exam:   07/26/2021 Medical Rec #: [id number]         Accession #:    [id number] Date of Birth: 12/27/1945         Patient Gender: F Patient Age:   60 years Exam Location:  [facility] Procedure:      VAS [place] LOWER EXTREMITY VENOUS (DVT) Referring Phys: JOSEPH ZAMMIT --------------------------------------------------------------------------------  Indications: Pain.  Risk Factors: None identified. Limitations: Body habitus and poor ultrasound/tissue interface. Comparison Study: No prior studies. Performing Technologist: Oliver Hum RVT  Examination Guidelines: A complete evaluation includes B-mode imaging, spectral Doppler, color Doppler, and power Doppler as needed of all accessible portions of each vessel. Bilateral testing is considered an integral part of a complete examination. Limited examinations for reoccurring indications may be  performed as noted. The reflux portion of the exam is performed with the patient in reverse Trendelenburg.  +---------+---------------+---------+-----------+----------+--------------+ RIGHT    CompressibilityPhasicitySpontaneityPropertiesThrombus Aging +---------+---------------+---------+-----------+----------+--------------+ CFV      Full           Yes      Yes                                 +---------+---------------+---------+-----------+----------+--------------+ SFJ      Full                                                        +---------+---------------+---------+-----------+----------+--------------+ FV Prox  Full                                                        +---------+---------------+---------+-----------+----------+--------------+ FV Mid  Yes      Yes                                 +---------+---------------+---------+-----------+----------+--------------+ FV Distal               Yes      Yes                                 +---------+---------------+---------+-----------+----------+--------------+ PFV      Full                                                        +---------+---------------+---------+-----------+----------+--------------+ POP      Full           Yes      Yes                                 +---------+---------------+---------+-----------+----------+--------------+ PTV      Full                                                        +---------+---------------+---------+-----------+----------+--------------+ PERO     Full                                                        +---------+---------------+---------+-----------+----------+--------------+   +---------+---------------+---------+-----------+----------+--------------+ LEFT     CompressibilityPhasicitySpontaneityPropertiesThrombus Aging +---------+---------------+---------+-----------+----------+--------------+ CFV      Full            Yes      Yes                                 +---------+---------------+---------+-----------+----------+--------------+ SFJ      Full                                                        +---------+---------------+---------+-----------+----------+--------------+ FV Prox  Full                                                        +---------+---------------+---------+-----------+----------+--------------+ FV Mid   Full                                                        +---------+---------------+---------+-----------+----------+--------------+ FV DistalFull                                                        +---------+---------------+---------+-----------+----------+--------------+  PFV      Full                                                        +---------+---------------+---------+-----------+----------+--------------+ POP      Full           Yes      Yes                                 +---------+---------------+---------+-----------+----------+--------------+ PTV      Full                                                        +---------+---------------+---------+-----------+----------+--------------+ PERO     Full                                                        +---------+---------------+---------+-----------+----------+--------------+     Summary: RIGHT: - There is no evidence of deep vein thrombosis in the lower extremity. However, portions of this examination were limited- see technologist comments above.  - No cystic structure found in the popliteal fossa.  LEFT: - There is no evidence of deep vein thrombosis in the lower extremity. However, portions of this examination were limited- see technologist comments above.  - No cystic structure found in the popliteal fossa.  *See table(s) above for measurements and observations. Electronically signed by Deitra Mayo MD on 07/26/2021 at 1:56:12 PM.    Final          Scheduled Meds:  acetaminophen-codeine  1 tablet Oral Once   aspirin EC  81 mg Oral Daily   azithromycin  500 mg Oral Daily   furosemide  60 mg Intravenous Q12H   furosemide  80 mg Intravenous Once   guaiFENesin  600 mg Oral BID   heparin  5,000 Units Subcutaneous Q8H   hydrALAZINE  100 mg Oral BID   HYDROcodone-acetaminophen  1 tablet Oral Once   insulin aspart  0-5 Units Subcutaneous QHS   insulin aspart  0-9 Units Subcutaneous TID WC   losartan  50 mg Oral Daily   metoprolol tartrate  25 mg Oral BID   simvastatin  10 mg Oral QHS   sodium bicarbonate  650 mg Oral Daily   tamsulosin  0.4 mg Oral Daily   Continuous Infusions:  cefTRIAXone (ROCEPHIN)  IV Stopped (07/26/21 2018)     LOS: 0 days    Time spent:25 mins. More than 50% of that time was spent in counseling and/or coordination of care.      Shelly Coss, MD [facility] P11/16/2022, 1:10 PM

## 2021-07-28 ENCOUNTER — Inpatient Hospital Stay (HOSPITAL_COMMUNITY): Payer: Medicare Other

## 2021-07-28 LAB — BASIC METABOLIC PANEL
Anion gap: 9 (ref 5–15)
BUN: 55 mg/dL — ABNORMAL HIGH (ref 8–23)
CO2: 25 mmol/L (ref 22–32)
Calcium: 9.3 mg/dL (ref 8.9–10.3)
Chloride: 106 mmol/L (ref 98–111)
Creatinine, Ser: 2.81 mg/dL — ABNORMAL HIGH (ref 0.44–1.00)
GFR, Estimated: 17 mL/min — ABNORMAL LOW (ref >60)
Glucose, Bld: 142 mg/dL — ABNORMAL HIGH (ref 70–99)
Potassium: 4.6 mmol/L (ref 3.5–5.1)
Sodium: 140 mmol/L (ref 135–145)

## 2021-07-28 LAB — GLUCOSE, CAPILLARY
Glucose-Capillary: 119 mg/dL — ABNORMAL HIGH (ref 70–99)
Glucose-Capillary: 201 mg/dL — ABNORMAL HIGH (ref 70–99)
Glucose-Capillary: 116 mg/dL — ABNORMAL HIGH (ref 70–99)
Glucose-Capillary: 147 mg/dL — ABNORMAL HIGH (ref 70–99)

## 2021-07-28 LAB — CREATININE, URINE, RANDOM: Creatinine, Urine: 32.98 mg/dL

## 2021-07-28 LAB — SODIUM, URINE, RANDOM: Sodium, Ur: 102 mmol/L

## 2021-07-28 MED ORDER — HYDRALAZINE HCL 25 MG PO TABS: 25.0000 mg | ORAL_TABLET | Freq: Two times a day (BID) | ORAL | Status: DC

## 2021-07-28 MED ORDER — ONDANSETRON HCL 4 MG/2ML IJ SOLN
4.0000 mg | Freq: Four times a day (QID) | INTRAMUSCULAR | Status: DC | PRN
  Administered 2021-07-28 – 2021-07-31 (×8): 4 mg via INTRAVENOUS
  Filled 2021-07-28 (×8): qty 2

## 2021-07-28 MED ORDER — METHOCARBAMOL 1000 MG/10ML IJ SOLN
500.0000 mg | Freq: Four times a day (QID) | INTRAVENOUS | Status: DC | PRN
  Filled 2021-07-28: qty 5

## 2021-07-28 MED ORDER — HYDROMORPHONE HCL 1 MG/ML IJ SOLN: 0.5000 mg | INTRAMUSCULAR | Status: DC | PRN

## 2021-07-28 MED ORDER — HYDRALAZINE HCL 10 MG PO TABS
10.0000 mg | ORAL_TABLET | Freq: Two times a day (BID) | ORAL | Status: DC
  Administered 2021-07-28: 22:00:00 10 mg via ORAL
  Filled 2021-07-28: qty 1

## 2021-07-28 MED ORDER — FUROSEMIDE 10 MG/ML IJ SOLN
80.0000 mg | Freq: Two times a day (BID) | INTRAMUSCULAR | Status: DC
  Administered 2021-07-28: 13:00:00 80 mg via INTRAVENOUS
  Filled 2021-07-28 (×3): qty 8

## 2021-07-28 MED ORDER — BARIUM SULFATE 2.1 % PO SUSP: 450.0000 mL | Freq: Two times a day (BID) | ORAL | Status: DC

## 2021-07-28 NOTE — Progress Notes (Signed)
PROGRESS NOTE    Nija Koopman Crigler  HKV:[id number] DOB: [birth year] DOA: 07/25/2021 PCP: Michell Heinrich, DO   Chief Complain:Right flank pain  Brief Narrative: Debby Clyne Caltagirone is a 75 y.o. female with medical history significant of coronary artery disease status post stent placement to mid RCA, paroxysmal A. fib not on anticoagulation due to history of spinal cord bleed, CKD stage IV, history of CVA with residual right-sided weakness, intermittent bradycardia while on beta-blocker, insulin-dependent diabetes type 2, hypertension, hyperlipidemia who presented to the emergency department with complaint of back pain and pleuritic chest pain.  She was complaining of left lower back/flank pain and complained about radiation to the left abdominal area.  She also reported shortness of breath along with her left flank pain. Chest x-ray did not show any pneumonia.  CT chest without contrast showed few scattered small vague groundglass nodular density in both upper lobes consistent with small airway disease/alveolitis, cardiomegaly. She was found to have mildly  elevated D-dimer.  CT chest for PE could not be done due to CKD status.  VQ scan was obtained which did not show any ventilation/perfusion mismatch.  Patient was deferred admission by our team but as per ED physician, she persistently desaturated on room air requiring 2 L of oxygen.  Patient was admitted for further evaluation and management for hypoxia.  Assessment & Plan:   Principal Problem:   Acute respiratory failure with hypoxia (HCC) Active Problems:   Essential hypertension, benign   Hyperlipidemia   History of CVA (cerebrovascular accident)   Paroxysmal atrial fibrillation (HCC)   CAD (coronary artery disease), native coronary artery  Acute respiratory failure with hypoxia: Currently unknown etiology.  Does not use oxygen at home.  She was complain of shortness of breath on minimal exertion.  Chest x-ray done on 07/25/2021 did not show  any pneumonia or pulmonary edema.  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 VQ scan did not suggest PE.  Venous Doppler did not show any DVT. Will continue supplemental oxygen for now.  We will do physical therapy assessment.  We are  empirically treating for community-acquired pneumonia, though suspicion for pneumonia is low.  The CT scan showed features of alveolitis .since VQ scan is negative, no indication for starting on anticoagulation.  She also has history of spinal cord bleeding, so she is not currently taking any anticoagulants for paroxysmal A. fib. Patient looks volume overloaded.  BNP elevated,started on IV Lasix but now held because creatinine creeped up repeat chest x-ray did not show any acute findings.  Echo shows EF of 60 to 65%, indeterminate left ventricular diastolic parameters, Mildly elevated pulmonary artery systolic pressure.   Left flank pain: CT renal study showed small nonobstructing left renal calculus. No evidence of ureteral calculus. Started on tamsulosin.  She complains of persistent left flank pain.  I discussed the case with Dr. Abner Greenspan, urology, he said there is nothing to offer from urology and is unlikely that the small stone is contributing to the pain.  We are repeating CT abdomen/pelvis without contrast.  Continue supportive care, pain management   History of hypertension: Takes hydralazine, losartan, metoprolol at home.  We will continue this medication.  Monitor blood pressure   History of CKD stage IV: Currently kidney function at baseline.  Baseline creatinine around 2.5.  Continue sodium bicarb tablets that she takes at home.  Monitor kidney function.She is s/p right radial aVF created 11/17/2020.  She follows with [facility].  Nephrology consulted and following   Diabetes type 2: On Tresiba at home.  Monitor blood sugars.  Continue sliding scale  insulin here.   History of paroxysmal A. fib:  On metoprolol for rate control.  Does not take anticoagulant due to history of spinal cord bleeding   History of nonhemorrhagic stroke: Has residual weakness on the right side.  Takes aspirin at home.   History of hyperlipidemia: Takes simvastatin  Obesity: BMI of 39  Debility/deconditioning: Patient seen by PT/OT and recommended skilled nursing facility on discharge.  Patient and family are agreeable          DVT prophylaxis:Heparin [place] Code Status: Full Family Communication: Husband at bedside on 07/26/21 Status is: Observation     Consultants: Nephrology   Procedures:None  Antimicrobials:  Anti-infectives (From admission, onward)    Start     Dose/Rate Route Frequency Ordered Stop   07/26/21 1700  azithromycin (ZITHROMAX) tablet 500 mg        500 mg Oral Daily 07/26/21 1653     07/26/21 1700  cefTRIAXone (ROCEPHIN) 1 g in sodium chloride 0.9 % 100 mL IVPB        1 g 200 mL/hr over 30 Minutes Intravenous Every 24 hours 07/26/21 1653         Subjective:  Patient seen and examined at the bedside this morning.  Hemodynamically stable.  She was still complaining of left-sided flank pain and was uncomfortable.  Husband at the bedside.  She was still on 2 L of oxygen per minute.  Objective: Vitals:   07/27/21 1819 07/27/21 2239 07/28/21 0140 07/28/21 0537  BP: (!) 121/48 (!) 103/49 (!) 101/44 (!) 116/55  Pulse: 76 62 (!) 56 64  Resp: 18 20 17 18   Temp: 98 F (36.7 C) 98.9 F (37.2 C) (!) 97.5 F (36.4 C) 98 F (36.7 C)  TempSrc: Oral Oral Oral Oral  SpO2: 95% 100% 100% 100%  Weight: 95.2 kg     Height: 5\' 1"  (1.549 m)      No intake or output data in the 24 hours ending 07/28/21 0746  Filed Weights   07/25/21 1321 07/27/21 1819  Weight: 95.3 kg 95.2 kg    Examination:  General exam: In mild discomfort due to left flank pain, obese HEENT: PERRL Respiratory system: Mildly diminished air entry  bilaterally, no wheezes or crackles  Cardiovascular system: Irregularly irregular rhythm  gastrointestinal system: Abdomen is nondistended, soft and nontender. Central nervous system: Alert and oriented Extremities: Bilateral lower extremity edema, no clubbing ,no cyanosis Skin: No rashes, no ulcers,no icterus     Data Reviewed: I have personally reviewed following labs and imaging studies  CBC: Recent Labs  Lab 07/25/21 1802 07/27/21 0408  WBC 5.9 7.0  HGB 12.0 10.6*  HCT 38.3 35.6*  MCV 92.3 94.9  PLT 195 009   Basic Metabolic Panel: Recent Labs  Lab 07/25/21 1802 07/26/21 1653 07/27/21 0408 07/28/21 0339  NA 140 140 142 140  K 4.4 4.8 4.5 4.6  CL 105 107 109 106  CO2 28 26 25 25   GLUCOSE 104* 113* 148* 142*  BUN 54* 51* 52* 55*  CREATININE 2.23* 2.37* 2.29* 2.81*  CALCIUM 9.5 9.3 9.1 9.3   GFR: Estimated Creatinine Clearance: 17.7 mL/min (A) (by C-G formula based on SCr of 2.81 mg/dL (H)). Liver Function Tests: Recent Labs  Lab 07/25/21 1846  AST 16  ALT 12  ALKPHOS 60  BILITOT 1.0  PROT  7.1  ALBUMIN 3.8   No results for input(s): LIPASE, AMYLASE in the last 168 hours. No results for input(s): AMMONIA in the last 168 hours. Coagulation Profile: Recent Labs  Lab 07/25/21 1846  INR 1.1   Cardiac Enzymes: No results for input(s): CKTOTAL, CKMB, CKMBINDEX, TROPONINI in the last 168 hours. BNP (last 3 results) No results for input(s): PROBNP in the last 8760 hours. HbA1C: Recent Labs    07/25/21 1802  HGBA1C 6.0*   CBG: Recent Labs  Lab 07/27/21 0811 07/27/21 1207 07/27/21 1717 07/27/21 2149 07/28/21 0736  GLUCAP 125* 171* 101* 140* 119*   Lipid Profile: No results for input(s): CHOL, HDL, LDLCALC, TRIG, CHOLHDL, LDLDIRECT in the last 72 hours. Thyroid Function Tests: No results for input(s): TSH, T4TOTAL, FREET4, T3FREE, THYROIDAB in the last 72 hours. Anemia Panel: No results for input(s): VITAMINB12, FOLATE, FERRITIN, TIBC, IRON,  RETICCTPCT in the last 72 hours. Sepsis Labs: No results for input(s): PROCALCITON, LATICACIDVEN in the last 168 hours.  Recent Results (from the past 240 hour(s))  Resp Panel by RT-PCR (Flu A&B, Covid) Nasopharyngeal Swab     Status: None   Collection Time: 07/26/21  1:00 PM   Specimen: Nasopharyngeal Swab; Nasopharyngeal(NP) swabs in vial transport medium  Result Value Ref Range Status   SARS Coronavirus 2 by RT PCR NEGATIVE NEGATIVE Final    Comment: (NOTE) SARS-CoV-2 target nucleic acids are NOT DETECTED.  The SARS-CoV-2 RNA is generally detectable in upper respiratory specimens during the acute phase of infection. The lowest concentration of SARS-CoV-2 viral copies this assay can detect is 138 copies/mL. A negative result does not preclude SARS-Cov-2 infection and should not be used as the sole basis for treatment or other patient management decisions. A negative result may occur with  improper specimen collection/handling, submission of specimen other than nasopharyngeal swab, presence of viral mutation(s) within the areas targeted by this assay, and inadequate number of viral copies(<138 copies/mL). A negative result must be combined with clinical observations, patient history, and epidemiological information. The expected result is Negative.  Fact Sheet for Patients:  EntrepreneurPulse.com.au  Fact Sheet for Healthcare Providers:  IncredibleEmployment.be  This test is no t yet approved or cleared by the [place] FDA and  has been authorized for detection and/or diagnosis of SARS-CoV-2 by FDA under an Emergency Use Authorization (EUA). This EUA will remain  in effect (meaning this test can be used) for the duration of the COVID-19 declaration under Section 564(b)(1) of the Act, 21 U.S.C.section 360bbb-3(b)(1), unless the authorization is terminated  or revoked sooner.       Influenza A by PCR NEGATIVE NEGATIVE Final   Influenza  B by PCR NEGATIVE NEGATIVE Final    Comment: (NOTE) The Xpert Xpress SARS-CoV-2/FLU/RSV plus assay is intended as an aid in the diagnosis of influenza from Nasopharyngeal swab specimens and should not be used as a sole basis for treatment. Nasal washings and aspirates are unacceptable for Xpert Xpress SARS-CoV-2/FLU/RSV testing.  Fact Sheet for Patients: EntrepreneurPulse.com.au  Fact Sheet for Healthcare Providers: IncredibleEmployment.be  This test is not yet approved or cleared by the [place] FDA and has been authorized for detection and/or diagnosis of SARS-CoV-2 by FDA under an Emergency Use Authorization (EUA). This EUA will remain in effect (meaning this test can be used) for the duration of the COVID-19 declaration under Section 564(b)(1) of the Act, 21 U.S.C. section 360bbb-3(b)(1), unless the authorization is terminated or revoked.  Performed at [facility], [address] [place], [place] [address]          Radiology Studies: NM PULMONARY VENT AND PERF (V/Q Scan)  Result Date: 07/26/2021 CLINICAL DATA:  75 year old female with shortness of breath and chest pain. History of blood clot. EXAM: NUCLEAR MEDICINE PERFUSION LUNG SCAN TECHNIQUE: Perfusion images were obtained in multiple projections after intravenous injection of radiopharmaceutical. Ventilation scans intentionally deferred if perfusion scan and chest x-ray adequate for interpretation during COVID 19 epidemic. RADIOPHARMACEUTICALS:  4.2 mCi Tc-69m MAA IV COMPARISON:  Noncontrast Chest CT yesterday. FINDINGS: Fairly homogeneous perfusion radiotracer activity in both lungs. Expected mediastinal photopenia. No convincing perfusion defect. IMPRESSION: Negative, no evidence of pulmonary embolus Electronically Signed   By: Genevie Ann M.D.   On: 07/26/2021 11:41   DG CHEST PORT 1 VIEW  Result Date: 07/26/2021 CLINICAL DATA:  Shortness of breath. EXAM:  PORTABLE CHEST 1 VIEW COMPARISON:  Chest x-ray 07/25/2021. FINDINGS: The heart is enlarged. The lungs are clear. There is no pleural effusion or pneumothorax. No acute fractures are seen. Cervical spinal fusion plate is present. IMPRESSION: Stable cardiomegaly.  No active disease. Electronically Signed   By: Ronney Asters M.D.   On: 07/26/2021 17:08   ECHOCARDIOGRAM COMPLETE  Result Date: 07/27/2021    ECHOCARDIOGRAM REPORT   Patient Name:   MEGGIN OLA Rosenstock Date of Exam: 07/27/2021 Medical Rec #:  [id number]        Height:       70.0 in Accession #:    [id number]       Weight:       210.0 lb Date of Birth:  [birth year]        BSA:          2.131 m Patient Age:    23 years         BP:           117/42 mmHg Patient Gender: F                HR:           58 bpm. Exam Location:  Inpatient Procedure: 2D Echo, Cardiac Doppler, Color Doppler and Intracardiac            Opacification Agent Indications:    I51.7 Cardiomegaly  History:        Patient has no prior history of Echocardiogram examinations.                 CAD, Stroke; Risk Factors:Hypertension.  Sonographer:    Glo Herring Referring Phys: [id number] Florinda Taflinger IMPRESSIONS  1. Left ventricular ejection fraction, by estimation, is 60 to 65%. The left ventricle has normal function. The left ventricle has no regional wall motion abnormalities. There is mild left ventricular hypertrophy. Left ventricular diastolic parameters are indeterminate.  2. Right ventricular systolic function is normal. The right ventricular size is mildly enlarged. There is mildly elevated pulmonary artery systolic pressure. The estimated right ventricular systolic pressure is 73.2 mmHg.  3. Left atrial size was moderately dilated.  4. Right atrial size was severely dilated.  5. The mitral valve is normal in structure. Trivial mitral valve regurgitation. No evidence of mitral stenosis.  6. The aortic valve is tricuspid. Aortic valve regurgitation is not visualized. No aortic stenosis  is present.  7. The inferior vena cava is dilated in size with >50% respiratory variability, suggesting right atrial pressure of 8 mmHg.  8. The patient was in atrial fibrillation. FINDINGS  Left Ventricle: Left ventricular ejection fraction, by estimation,  is 60 to 65%. The left ventricle has normal function. The left ventricle has no regional wall motion abnormalities. The left ventricular internal cavity size was normal in size. There is  mild left ventricular hypertrophy. Left ventricular diastolic parameters are indeterminate. Right Ventricle: The right ventricular size is mildly enlarged. No increase in right ventricular wall thickness. Right ventricular systolic function is normal. There is mildly elevated pulmonary artery systolic pressure. The tricuspid regurgitant velocity is 2.93 m/s, and with an assumed right atrial pressure of 8 mmHg, the estimated right ventricular systolic pressure is 94.8 mmHg. Left Atrium: Left atrial size was moderately dilated. Right Atrium: Right atrial size was severely dilated. Pericardium: There is no evidence of pericardial effusion. Mitral Valve: The mitral valve is normal in structure. There is mild calcification of the mitral valve leaflet(s). Mild mitral annular calcification. Trivial mitral valve regurgitation. No evidence of mitral valve stenosis. Tricuspid Valve: The tricuspid valve is normal in structure. Tricuspid valve regurgitation is mild. Aortic Valve: The aortic valve is tricuspid. Aortic valve regurgitation is not visualized. No aortic stenosis is present. Aortic valve mean gradient measures 4.7 mmHg. Aortic valve peak gradient measures 9.6 mmHg. Aortic valve area, by VTI measures 2.81 cm. Pulmonic Valve: The pulmonic valve was normal in structure. Pulmonic valve regurgitation is trivial. Aorta: The aortic root is normal in size and structure. Venous: The inferior vena cava is dilated in size with greater than 50% respiratory variability, suggesting right  atrial pressure of 8 mmHg. IAS/Shunts: No atrial level shunt detected by color flow Doppler.  LEFT VENTRICLE PLAX 2D LVIDd:         4.30 cm   Diastology LVIDs:         2.80 cm   LV e' medial:    6.45 cm/s LV PW:         1.40 cm   LV E/e' medial:  22.7 LV IVS:        1.40 cm   LV e' lateral:   11.27 cm/s LVOT diam:     2.00 cm   LV E/e' lateral: 13.0 LV SV:         92 LV SV Index:   43 LVOT Area:     3.14 cm  RIGHT VENTRICLE             IVC RV Basal diam:  4.40 cm     IVC diam: 2.80 cm RV S prime:     10.03 cm/s LEFT ATRIUM             Index        RIGHT ATRIUM           Index LA diam:        4.30 cm 2.02 cm/m   RA Area:     29.10 cm LA Vol (A2C):   66.3 ml 31.11 ml/m  RA Volume:   106.00 ml 49.74 ml/m LA Vol (A4C):   85.8 ml 40.26 ml/m LA Biplane Vol: 81.4 ml 38.20 ml/m  AORTIC VALVE                     PULMONIC VALVE AV Area (Vmax):    2.62 cm      PV Vmax:       1.38 m/s AV Area (Vmean):   2.53 cm      PV Peak grad:  7.6 mmHg AV Area (VTI):     2.81 cm AV Vmax:  155.00 cm/s AV Vmean:          103.500 cm/s AV VTI:            0.327 m AV Peak Grad:      9.6 mmHg AV Mean Grad:      4.7 mmHg LVOT Vmax:         129.33 cm/s LVOT Vmean:        83.200 cm/s LVOT VTI:          0.293 m LVOT/AV VTI ratio: 0.90  AORTA Ao Root diam: 3.30 cm Ao Asc diam:  3.40 cm MITRAL VALVE                TRICUSPID VALVE MV Area (PHT): 3.43 cm     TR Peak grad:   34.3 mmHg MV Decel Time: 221 msec     TR Vmax:        293.00 cm/s MV E velocity: 146.67 cm/s                             SHUNTS                             Systemic VTI:  0.29 m                             Systemic Diam: 2.00 cm Dalton McleanMD Electronically signed by Franki Monte Signature Date/Time: 07/27/2021/4:51:25 PM    Final    CT Renal Stone Study  Result Date: 07/26/2021 CLINICAL DATA:  Flank and back pain.  Kidney stone suspected. EXAM: CT ABDOMEN AND PELVIS WITHOUT CONTRAST TECHNIQUE: Multidetector CT imaging of the abdomen and pelvis was  performed following the standard protocol without IV contrast. COMPARISON:  Abdominopelvic CT 10/25/2005. Chest CT 07/25/2021. Abdominopelvic CT 09/18/2018 unavailable. FINDINGS: Lower chest: Cardiomegaly and atherosclerosis of the aorta and coronary arteries again noted. Trace pleural fluid on the right with patchy linear atelectasis or scarring in both lung bases, similar to recent chest CT. Hepatobiliary: No focal hepatic abnormalities are identified on noncontrast imaging. There is stable extrahepatic biliary dilatation post cholecystectomy, likely physiologic. No evidence of calcified intraductal calculus. Pancreas: Mildly atrophy. No evidence of pancreatic mass, pancreatic ductal dilatation or surrounding inflammation. Spleen: Normal in size without focal abnormality. Adrenals/Urinary Tract: Stable mild prominence of both adrenal glands and a 1.7 cm low-density left adrenal nodule consistent with an adenoma. No suspicious adrenal nodularity. Mild renal cortical thinning and perinephric soft tissue stranding bilaterally, similar to previous studies. There is a tiny nonobstructing calculus in the lower pole of the left kidney. No evidence of ureteral or bladder calculus. There is no hydronephrosis. There is a probable small exophytic cyst projecting from the lower pole of the left kidney. The bladder appears moderately distended without focal abnormality. Stomach/Bowel: No enteric contrast administered. The stomach appears unremarkable for its degree of distension. No evidence of bowel wall thickening, distention or surrounding inflammatory change. The appendix appears normal. There are diverticular changes throughout the colon, greatest within the sigmoid colon. Vascular/Lymphatic: There are no enlarged abdominal or pelvic lymph nodes. Aortic and branch vessel atherosclerosis without aneurysm. Reproductive: Scattered uterine calcifications, likely due to calcified fibroids. No suspicious adnexal findings.  Other: Previous ventral hernia repair without recurrent hernia, ascites or focal extraluminal fluid collection. Musculoskeletal: No acute or significant osseous findings. Grossly stable right paraspinal nodularity  at L4-5. Mild multilevel lumbar spondylosis. Previous left total hip arthroplasty with moderate right hip degenerative changes. IMPRESSION: 1. Small nonobstructing left renal calculus. No evidence of ureteral calculus or hydronephrosis. 2. Moderate bladder distension without apparent focal abnormality. Chronic renal cortical thinning and perinephric soft tissue stranding bilaterally. 3. Diffuse colonic diverticulosis without evidence of recurrent acute inflammation. 4. Stable additional incidental findings including chronic biliary dilatation post cholecystectomy, previous ventral hernia repair, scattered small calcified uterine fibroids and Aortic Atherosclerosis (ICD10-I70.0). Electronically Signed   By: Richardean Sale M.D.   On: 07/26/2021 15:00   VAS [place] LOWER EXTREMITY VENOUS (DVT) (ONLY [facility] & [facility])  Result Date: 07/26/2021  Lower Venous DVT Study Patient Name:  HALY FEHER Slagter  Date of Exam:   07/26/2021 Medical Rec #: [id number]         Accession #:    [id number] Date of Birth: Dec 20, 1945         Patient Gender: F Patient Age:   54 years Exam [facility] Procedure:      VAS [place] LOWER EXTREMITY VENOUS (DVT) Referring Phys: JOSEPH ZAMMIT --------------------------------------------------------------------------------  Indications: Pain.  Risk Factors: None identified. Limitations: Body habitus and poor ultrasound/tissue interface. Comparison Study: No prior studies. Performing Technologist: Oliver Hum RVT  Examination Guidelines: A complete evaluation includes B-mode imaging, spectral Doppler, color Doppler, and power Doppler as needed of all accessible portions of each vessel. Bilateral testing is considered an integral part of a complete examination. Limited examinations for  reoccurring indications may be performed as noted. The reflux portion of the exam is performed with the patient in reverse Trendelenburg.  +---------+---------------+---------+-----------+----------+--------------+ RIGHT    CompressibilityPhasicitySpontaneityPropertiesThrombus Aging +---------+---------------+---------+-----------+----------+--------------+ CFV      Full           Yes      Yes                                 +---------+---------------+---------+-----------+----------+--------------+ SFJ      Full                                                        +---------+---------------+---------+-----------+----------+--------------+ FV Prox  Full                                                        +---------+---------------+---------+-----------+----------+--------------+ FV Mid                  Yes      Yes                                 +---------+---------------+---------+-----------+----------+--------------+ FV Distal               Yes      Yes                                 +---------+---------------+---------+-----------+----------+--------------+ PFV      Full                                                        +---------+---------------+---------+-----------+----------+--------------+  POP      Full           Yes      Yes                                 +---------+---------------+---------+-----------+----------+--------------+ PTV      Full                                                        +---------+---------------+---------+-----------+----------+--------------+ PERO     Full                                                        +---------+---------------+---------+-----------+----------+--------------+   +---------+---------------+---------+-----------+----------+--------------+ LEFT     CompressibilityPhasicitySpontaneityPropertiesThrombus Aging  +---------+---------------+---------+-----------+----------+--------------+ CFV      Full           Yes      Yes                                 +---------+---------------+---------+-----------+----------+--------------+ SFJ      Full                                                        +---------+---------------+---------+-----------+----------+--------------+ FV Prox  Full                                                        +---------+---------------+---------+-----------+----------+--------------+ FV Mid   Full                                                        +---------+---------------+---------+-----------+----------+--------------+ FV DistalFull                                                        +---------+---------------+---------+-----------+----------+--------------+ PFV      Full                                                        +---------+---------------+---------+-----------+----------+--------------+ POP      Full           Yes      Yes                                 +---------+---------------+---------+-----------+----------+--------------+  PTV      Full                                                        +---------+---------------+---------+-----------+----------+--------------+ PERO     Full                                                        +---------+---------------+---------+-----------+----------+--------------+     Summary: RIGHT: - There is no evidence of deep vein thrombosis in the lower extremity. However, portions of this examination were limited- see technologist comments above.  - No cystic structure found in the popliteal fossa.  LEFT: - There is no evidence of deep vein thrombosis in the lower extremity. However, portions of this examination were limited- see technologist comments above.  - No cystic structure found in the popliteal fossa.  *See table(s) above for measurements and observations.  Electronically signed by Deitra Mayo MD on 07/26/2021 at 1:56:12 PM.    Final         Scheduled Meds:  acetaminophen-codeine  1 tablet Oral Once   aspirin EC  81 mg Oral Daily   azithromycin  500 mg Oral Daily   guaiFENesin  600 mg Oral BID   heparin  5,000 Units Subcutaneous Q8H   hydrALAZINE  50 mg Oral BID   HYDROcodone-acetaminophen  1 tablet Oral Once   insulin aspart  0-5 Units Subcutaneous QHS   insulin aspart  0-9 Units Subcutaneous TID WC   losartan  25 mg Oral Daily   metoprolol tartrate  25 mg Oral BID   simvastatin  10 mg Oral QHS   sodium bicarbonate  650 mg Oral Daily   tamsulosin  0.4 mg Oral Daily   Continuous Infusions:  cefTRIAXone (ROCEPHIN)  IV 1 g (07/27/21 1708)     LOS: 1 day    Time spent:25 mins. More than 50% of that time was spent in counseling and/or coordination of care.      Shelly Coss, MD [facility] P11/17/2022, 7:46 AM

## 2021-07-28 NOTE — Progress Notes (Signed)
Report received from K. Seay,RN. No change in assessment, will continue plan of care. Zigmond Trela, Laurel Dimmer, RN

## 2021-07-28 NOTE — Progress Notes (Signed)
Pt's O2 Sats dropped to 83% RA at rest. 2L/[place] reapplied. MD aware, will continue to monitor.

## 2021-07-28 NOTE — Progress Notes (Signed)
[facility] Progress Note  Subjective: seen in room, voided 4 times, 650 cc UOP recorded. Still desat's today w/ O2 off. No new c/o's. Bp's are 100- 115/ 45-65 range.   Vitals:   07/28/21 0537 07/28/21 0832 07/28/21 1051 07/28/21 1055  BP: (!) 116/55   (!) 117/53  Pulse: 64   72  Resp: 18     Temp: 98 F (36.7 C)     TempSrc: Oral     SpO2: 100% 100% 99%   Weight:      Height:        Exam: General: WDWN NAD Head: NCAT sclera not icteric MMM Neck: Supple. No lymphadenopathy Lungs: Decreased breath sounds bilaterally, No wheeze, rales or rhonchi. Breathing is unlabored. Heart: RRR. No murmur, rubs or gallops, no JVD.  Abdomen: soft, nontender, nondistended, +BS, M/S:  Equal strength b/l in upper and lower extremities.  Lower extremities: 1+ pretibial edema in LLE, 2+ pretibial and hip edema in RLE. Hyperpigmentation in RLE. Neuro: AAOx3. Moves all extremities spontaneously. Psych:  Responds to questions appropriately with a normal affect.   UA 11/15 - 100 prot, rare bact, 0-5 rbc, 6-10 wbc   Alb 3.8  LFT's ok   Na 130 BUN 55, Cr 2.81 today    BP's 105- 120/ 44- 60   Assessment/ Plan: CKD4-  BUN 52 (baseline 44-47 in 2022), Crt 2.29 (baseline 2.01-2.70 in 2022), GFR 21 on 11/16. Creat up w/ diuresis, cont diuresis. Still hypoxic.  Acute respiratory failure with hypoxia- CXR revealed no acute processes.  To our read there was vasc congestion.  BNP 378.9 on 11/15. Continue O2 [place].   Volume overload- LE edema and R basilar rales on exam. Pt has been pushing fluids at home per advice of her kidney doctor (6 quarts per day).  Hypertension- BP soft again today, will dc losartan, lower hydralazine to 25 bid. Also getting metoprolol 25 bid.  Hx of PAF- bradycardia during encounter. Hold parameters on metoprolol. 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  Hx of CVA- some right sided residual weakness, uses a cane. Continue Aspirin. Hyperlipidemia- Continue  Simvastatin     Rob Breckan Cafiero 07/28/2021, 11:42 AM   Recent Labs  Lab 07/25/21 1802 07/26/21 1653 07/27/21 0408 07/28/21 0339  K 4.4   < > 4.5 4.6  BUN 54*   < > 52* 55*  CREATININE 2.23*   < > 2.29* 2.81*  CALCIUM 9.5   < > 9.1 9.3  HGB 12.0  --  10.6*  --    < > = values in this interval not displayed.   Inpatient medications:  acetaminophen-codeine  1 tablet Oral Once   aspirin EC  81 mg Oral Daily   azithromycin  500 mg Oral Daily   Barium Sulfate  450 mL Oral BID   guaiFENesin  600 mg Oral BID   heparin  5,000 Units Subcutaneous Q8H   hydrALAZINE  50 mg Oral BID   HYDROcodone-acetaminophen  1 tablet Oral Once   insulin aspart  0-5 Units Subcutaneous QHS   insulin aspart  0-9 Units Subcutaneous TID WC   losartan  25 mg Oral Daily   metoprolol tartrate  25 mg Oral BID   simvastatin  10 mg Oral QHS   sodium bicarbonate  650 mg Oral Daily   tamsulosin  0.4 mg Oral Daily    cefTRIAXone (ROCEPHIN)  IV 1 g (07/27/21 1708)   methocarbamol (ROBAXIN) IV     HYDROmorphone (DILAUDID) injection,  methocarbamol (ROBAXIN) IV, oxyCODONE

## 2021-07-29 ENCOUNTER — Inpatient Hospital Stay (HOSPITAL_COMMUNITY): Payer: Medicare Other

## 2021-07-29 DIAGNOSIS — J9601 Acute respiratory failure with hypoxia: Secondary | ICD-10-CM | POA: Diagnosis not present

## 2021-07-29 LAB — RESPIRATORY PANEL BY PCR

## 2021-07-29 LAB — BASIC METABOLIC PANEL
Anion gap: 6 (ref 5–15)
BUN: 63 mg/dL — ABNORMAL HIGH (ref 8–23)
CO2: 29 mmol/L (ref 22–32)
Calcium: 8.8 mg/dL — ABNORMAL LOW (ref 8.9–10.3)
Chloride: 103 mmol/L (ref 98–111)
Creatinine, Ser: 3.73 mg/dL — ABNORMAL HIGH (ref 0.44–1.00)
GFR, Estimated: 12 mL/min — ABNORMAL LOW (ref >60)
Glucose, Bld: 130 mg/dL — ABNORMAL HIGH (ref 70–99)
Potassium: 4.3 mmol/L (ref 3.5–5.1)
Sodium: 138 mmol/L (ref 135–145)

## 2021-07-29 LAB — CBC
HCT: 31.6 % — ABNORMAL LOW (ref 36.0–46.0)
Hemoglobin: 9.5 g/dL — ABNORMAL LOW (ref 12.0–15.0)
MCH: 28.9 pg (ref 26.0–34.0)
MCHC: 30.1 g/dL (ref 30.0–36.0)
MCV: 96 fL (ref 80.0–100.0)
Platelets: 162 10*3/uL (ref 150–400)
RBC: 3.29 MIL/uL — ABNORMAL LOW (ref 3.87–5.11)
RDW: 14.5 % (ref 11.5–15.5)
WBC: 4.6 10*3/uL (ref 4.0–10.5)
nRBC: 0 % (ref 0.0–0.2)

## 2021-07-29 LAB — GLUCOSE, CAPILLARY
Glucose-Capillary: 113 mg/dL — ABNORMAL HIGH (ref 70–99)
Glucose-Capillary: 212 mg/dL — ABNORMAL HIGH (ref 70–99)
Glucose-Capillary: 139 mg/dL — ABNORMAL HIGH (ref 70–99)
Glucose-Capillary: 179 mg/dL — ABNORMAL HIGH (ref 70–99)

## 2021-07-29 LAB — BRAIN NATRIURETIC PEPTIDE: B Natriuretic Peptide: 245.6 pg/mL — ABNORMAL HIGH (ref 0.0–100.0)

## 2021-07-29 MED ORDER — METOPROLOL TARTRATE 25 MG PO TABS
12.5000 mg | ORAL_TABLET | Freq: Two times a day (BID) | ORAL | Status: DC
  Administered 2021-07-29 – 2021-08-02 (×6): 12.5 mg via ORAL
  Filled 2021-07-29 (×8): qty 1

## 2021-07-29 NOTE — Care Management Important Message (Signed)
Important Message  Patient Details IM Letter given to the Patient. Name: Bianca White MRN: [id number] Date of Birth: 02/21/1946   Medicare Important Message Given:  Yes     Kerin Salen 07/29/2021, 12:34 PM

## 2021-07-29 NOTE — Progress Notes (Signed)
Pt O2 Sats dropped to 87% RA at rest, 2L/[place] reapplied. Pt also took off her O2 briefly while OOB bathing- O2 Sats dropped to 78% RA. MD made aware.

## 2021-07-29 NOTE — Progress Notes (Signed)
[facility] Progress Note  Subjective: seen in room, 1100 cc UOP yesterday w/ IV lasix, but creat up to 3.8 this am.  SoO2's better today, per RN at bedside.  Up in chair.   Vitals:   07/29/21 0504 07/29/21 0505 07/29/21 0910 07/29/21 1400  BP:  (!) 103/52 (!) 122/55 128/61  Pulse:  (!) 56 (!) 50 63  Resp:  20  20  Temp:  97.8 F (36.6 C)  98.4 F (36.9 C)  TempSrc:  Oral  Oral  SpO2:  100%  98%  Weight: 95.7 kg     Height:        Exam: General: WDWN NAD Lungs: Decreased breath sounds bilaterally. Breathing is unlabored. Heart: RRR. No murmur, rubs or gallops, no JVD.  Abdomen: soft, nontender, nondistended, +BS, M/S:  Equal strength b/l in upper and lower extremities.  Lower extremities: 1+ pretibial edema in LLE, 2+ pretibial and hip edema in RLE.   UA 11/15 - 100 prot, rare bact, 0-5 rbc, 6-10 wbc   Alb 3.8  LFT's ok   Na 130 BUN 55, Cr 2.81 today    BP's 105- 120/ 44- 60   Assessment/ Plan: CKD4-  BUN 52 (baseline 44-47 in 2022), Crt 2.29 (baseline 2.01-2.70 in 2022), GFR 21 on 11/16. Good UOP w/ IV lasix, although creat up 2.9 yest and 3.8 today.  Will hold further diuresis for now. O2 sat's may be better today, they are titrating her O2 down per RN.  May need higher creatinine for euvolemia. Will follow.  Acute respiratory failure with hypoxia- CXR to our read there was possible vasc congestion.  BNP 378.9 on 11/15. Continue O2 [place]. As above.  Volume overload- +leg edema, hypoxia presumed due to [place] water. Pt has been pushing fluids at home per advice of her kidney doctor (6 quarts per day). See above as well.  Hypertension- BP soft again today, we dc'd the losartan, lowered hydralazine to 10 bid. Also getting metoprolol 25 bid.  Hx of PAF- bradycardia during encounter. Hold parameters on metoprolol. 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  Hx of CVA- some right sided residual weakness, uses a cane. Continue Aspirin. Hyperlipidemia-  Continue Simvastatin     Rob Anivea Velasques 07/29/2021, 3:38 PM   Recent Labs  Lab 07/27/21 0408 07/28/21 0339 07/29/21 0339 07/29/21 1200  K 4.5 4.6 4.3  --   BUN 52* 55* 63*  --   CREATININE 2.29* 2.81* 3.73*  --   CALCIUM 9.1 9.3 8.8*  --   HGB 10.6*  --   --  9.5*    Inpatient medications:  acetaminophen-codeine  1 tablet Oral Once   aspirin EC  81 mg Oral Daily   Barium Sulfate  450 mL Oral BID   guaiFENesin  600 mg Oral BID   heparin  5,000 Units Subcutaneous Q8H   HYDROcodone-acetaminophen  1 tablet Oral Once   insulin aspart  0-5 Units Subcutaneous QHS   insulin aspart  0-9 Units Subcutaneous TID WC   metoprolol tartrate  12.5 mg Oral BID   simvastatin  10 mg Oral QHS   sodium bicarbonate  650 mg Oral Daily   tamsulosin  0.4 mg Oral Daily    cefTRIAXone (ROCEPHIN)  IV 1 g (07/28/21 1744)   methocarbamol (ROBAXIN) IV     HYDROmorphone (DILAUDID) injection, methocarbamol (ROBAXIN) IV, ondansetron (ZOFRAN) IV, oxyCODONE

## 2021-07-29 NOTE — Progress Notes (Signed)
PT Cancellation Note  Patient Details Name: Bianca White MRN: [id number] DOB: Nov 24, 1945   Cancelled Treatment:    Reason Eval/Treat Not Completed: Medical issues which prohibited therapy  Pt desaturates on room air and having left flank pain per nursing.  Pt to have chest CT today (r/o PE) so will await results prior to increasing mobility.  Pt is currently at least transferring in room with nursing staff today.    Kati L Payson 07/29/2021, 2:06 PM Arlyce Dice, DPT Acute Rehabilitation Services Pager: [phone] Office: [phone]

## 2021-07-29 NOTE — Progress Notes (Signed)
PROGRESS NOTE    Bianca White  IRW:[id number] DOB: 1946/02/22 DOA: 07/25/2021 PCP: Michell Heinrich, DO   Chief Complain:Right flank pain  Brief Narrative: Bianca White is a 75 y.o. female with medical history significant of coronary artery disease status post stent placement to mid RCA, paroxysmal A. fib not on anticoagulation due to history of spinal cord bleed, CKD stage IV, history of CVA with residual right-sided weakness, intermittent bradycardia while on beta-blocker, insulin-dependent diabetes type 2, hypertension, hyperlipidemia who presented to the emergency department with complaint of back pain and pleuritic chest pain.  She was complaining of left lower back/flank pain and complained about radiation to the left abdominal area.  She also reported shortness of breath along with her left flank pain. Chest x-ray did not show any pneumonia.  CT chest without contrast showed few scattered small vague groundglass nodular density in both upper lobes consistent with small airway disease/alveolitis, cardiomegaly. She was found to have mildly  elevated D-dimer.  CT chest for PE could not be done due to CKD status.  VQ scan was obtained which did not show any ventilation/perfusion mismatch.  Patient was deferred admission by our team but as per ED physician, she persistently desaturated on room air requiring 2 L of oxygen.  Patient was admitted for further evaluation and management for hypoxia.  Assessment & Plan:   Principal Problem:   Acute respiratory failure with hypoxia (HCC) Active Problems:   Essential hypertension, benign   Hyperlipidemia   History of CVA (cerebrovascular accident)   Paroxysmal atrial fibrillation (HCC)   CAD (coronary artery disease), native coronary artery  Acute respiratory failure with hypoxia: Currently unknown etiology.  Does not use oxygen at home.  She was complain of shortness of breath on minimal exertion.  Chest x-ray done on 07/25/2021 did not show  any pneumonia or pulmonary edema.  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 VQ scan did not suggest PE.  Venous Doppler did not show any DVT. Will continue supplemental oxygen for now..  We are  empirically treating for community-acquired pneumonia, though suspicion for pneumonia is low.  The CT scan showed features of alveolitis .since VQ scan is negative, no indication for starting on anticoagulation.  She also has history of spinal cord bleeding, so she is not currently taking any anticoagulants for paroxysmal A. fib. Patient looked volume overloaded.  BNP elevated,started on IV Lasix but now held because creatinine creeped up repeat chest x-ray did not show any acute findings.  Echo shows EF of 60 to 65%, indeterminate left ventricular diastolic parameters, Mildly elevated pulmonary artery systolic pressure. Ideally we need to do CT angiogram to rule out PE for investigation of her hypoxia but her kidney function does not allow that.   Left flank pain: CT renal study showed small nonobstructing left renal calculus. No evidence of ureteral calculus. Started on tamsulosin.  She complains of persistent left flank pain.  I discussed the case with Dr. Abner Greenspan, urology, he said there is nothing to offer from urology and is unlikely that the small stone is contributing to the pain.  We  repeated CT abdomen/pelvis :  no new finding.  We will do CT chest without contrast today.   continue supportive care, pain management   History of hypertension: Takes hydralazine, losartan, metoprolol at home.  Blood pressure soft.  Discontinue hydralazine, losartan.  Decreased the dose of metoprolol  AKI on CKD stage  IV:   Baseline creatinine around 2.5.  Now creeped up to the range of 3.Continue sodium bicarb tablets that she takes at home.  Monitor kidney function.She is s/p right radial aVF created 11/17/2020.  She  follows with [facility].  Nephrology consulted and following   Diabetes type 2: On Tresiba at home.  Monitor blood sugars.  Continue sliding scale insulin here.   History of paroxysmal A. fib:  On metoprolol for rate control.  Does not take anticoagulant due to history of spinal cord bleeding   History of nonhemorrhagic stroke: Has residual weakness on the right side.  Takes aspirin at home.   History of hyperlipidemia: Takes simvastatin  Obesity: BMI of 39  Debility/deconditioning: Patient seen by PT/OT and recommended skilled nursing facility on discharge.  Patient and family are agreeable          DVT prophylaxis:Heparin Williamson Code Status: Full Family Communication: Husband at bedside on 07/29/21 Status is: Observation     Consultants: Nephrology   Procedures:None  Antimicrobials:  Anti-infectives (From admission, onward)    Start     Dose/Rate Route Frequency Ordered Stop   07/26/21 1700  azithromycin (ZITHROMAX) tablet 500 mg  Status:  Discontinued        500 mg Oral Daily 07/26/21 1653 07/29/21 0734   07/26/21 1700  cefTRIAXone (ROCEPHIN) 1 g in sodium chloride 0.9 % 100 mL IVPB        1 g 200 mL/hr over 30 Minutes Intravenous Every 24 hours 07/26/21 1653         Subjective:  Patient seen and examined the bedside.  Sitting on the chair.  Husband at bedside.  She continues to complain of left flank pain and says she had this pain when she moves.  She said the pain is severe.  She was still on 2 L of oxygen per minute.  Objective: Vitals:   07/28/21 2141 07/29/21 0500 07/29/21 0504 07/29/21 0505  BP: (!) 129/49   (!) 103/52  Pulse: 80   (!) 56  Resp: 18   20  Temp: 98.4 F (36.9 C)   97.8 F (36.6 C)  TempSrc: Oral   Oral  SpO2: 97%   100%  Weight:  95.7 kg 95.7 kg   Height:        Intake/Output Summary (Last 24 hours) at 07/29/2021 0736 Last data filed at 07/29/2021 0700 Gross per 24 hour  Intake 340 ml  Output 1700 ml  Net -1360 ml     Filed Weights   07/27/21 1819 07/29/21 0500 07/29/21 0504  Weight: 95.2 kg 95.7 kg 95.7 kg    Examination:   General exam: Pleasant elderly female, deconditioned, obese HEENT: PERRL Respiratory system: Bilateral crackles on the bases Cardiovascular system: Irregularly irregular rhythm.  Gastrointestinal system: Abdomen is nondistended, soft and nontender. Central nervous system: Alert and oriented Extremities: LE edema, no clubbing ,no cyanosis Skin: No rashes, no ulcers,no icterus     Data Reviewed: I have personally reviewed following labs and imaging studies  CBC: Recent Labs  Lab 07/25/21 1802 07/27/21 0408  WBC 5.9 7.0  HGB 12.0 10.6*  HCT 38.3 35.6*  MCV 92.3 94.9  PLT 195 557   Basic Metabolic Panel: Recent Labs  Lab 07/25/21 1802 07/26/21 1653 07/27/21 0408 07/28/21 0339 07/29/21 0339  NA 140 140 142 140 138  K 4.4 4.8 4.5 4.6 4.3  CL 105 107 109 106 103  CO2 28 26 25 25 29   GLUCOSE 104* 113* 148*  142* 130*  BUN 54* 51* 52* 55* 63*  CREATININE 2.23* 2.37* 2.29* 2.81* 3.73*  CALCIUM 9.5 9.3 9.1 9.3 8.8*   GFR: Estimated Creatinine Clearance: 13.4 mL/min (A) (by C-G formula based on SCr of 3.73 mg/dL (H)). Liver Function Tests: Recent Labs  Lab 07/25/21 1846  AST 16  ALT 12  ALKPHOS 60  BILITOT 1.0  PROT 7.1  ALBUMIN 3.8   No results for input(s): LIPASE, AMYLASE in the last 168 hours. No results for input(s): AMMONIA in the last 168 hours. Coagulation Profile: Recent Labs  Lab 07/25/21 1846  INR 1.1   Cardiac Enzymes: No results for input(s): CKTOTAL, CKMB, CKMBINDEX, TROPONINI in the last 168 hours. BNP (last 3 results) No results for input(s): PROBNP in the last 8760 hours. HbA1C: No results for input(s): HGBA1C in the last 72 hours.  CBG: Recent Labs  Lab 07/27/21 2149 07/28/21 0736 07/28/21 1201 07/28/21 1705 07/28/21 2137  GLUCAP 140* 119* 201* 116* 147*   Lipid Profile: No results for input(s): CHOL, HDL,  LDLCALC, TRIG, CHOLHDL, LDLDIRECT in the last 72 hours. Thyroid Function Tests: No results for input(s): TSH, T4TOTAL, FREET4, T3FREE, THYROIDAB in the last 72 hours. Anemia Panel: No results for input(s): VITAMINB12, FOLATE, FERRITIN, TIBC, IRON, RETICCTPCT in the last 72 hours. Sepsis Labs: No results for input(s): PROCALCITON, LATICACIDVEN in the last 168 hours.  Recent Results (from the past 240 hour(s))  Resp Panel by RT-PCR (Flu A&B, Covid) Nasopharyngeal Swab     Status: None   Collection Time: 07/26/21  1:00 PM   Specimen: Nasopharyngeal Swab; Nasopharyngeal(NP) swabs in vial transport medium  Result Value Ref Range Status   SARS Coronavirus 2 by RT PCR NEGATIVE NEGATIVE Final    Comment: (NOTE) SARS-CoV-2 target nucleic acids are NOT DETECTED.  The SARS-CoV-2 RNA is generally detectable in upper respiratory specimens during the acute phase of infection. The lowest concentration of SARS-CoV-2 viral copies this assay can detect is 138 copies/mL. A negative result does not preclude SARS-Cov-2 infection and should not be used as the sole basis for treatment or other patient management decisions. A negative result may occur with  improper specimen collection/handling, submission of specimen other than nasopharyngeal swab, presence of viral mutation(s) within the areas targeted by this assay, and inadequate number of viral copies(<138 copies/mL). A negative result must be combined with clinical observations, patient history, and epidemiological information. The expected result is Negative.  Fact Sheet for Patients:  EntrepreneurPulse.com.au  Fact Sheet for Healthcare Providers:  IncredibleEmployment.be  This test is no t yet approved or cleared by the [place] FDA and  has been authorized for detection and/or diagnosis of SARS-CoV-2 by FDA under an Emergency Use Authorization (EUA). This EUA will remain  in effect (meaning this test  can be used) for the duration of the COVID-19 declaration under Section 564(b)(1) of the Act, 21 U.S.C.section 360bbb-3(b)(1), unless the authorization is terminated  or revoked sooner.       Influenza A by PCR NEGATIVE NEGATIVE Final   Influenza B by PCR NEGATIVE NEGATIVE Final    Comment: (NOTE) The Xpert Xpress SARS-CoV-2/FLU/RSV plus assay is intended as an aid in the diagnosis of influenza from Nasopharyngeal swab specimens and should not be used as a sole basis for treatment. Nasal washings and aspirates are unacceptable for Xpert Xpress SARS-CoV-2/FLU/RSV testing.  Fact Sheet for Patients: EntrepreneurPulse.com.au  Fact Sheet for Healthcare Providers: IncredibleEmployment.be  This test is not yet approved or cleared by the [place] FDA  and has been authorized for detection and/or diagnosis of SARS-CoV-2 by FDA under an Emergency Use Authorization (EUA). This EUA will remain in effect (meaning this test can be used) for the duration of the COVID-19 declaration under Section 564(b)(1) of the Act, 21 U.S.C. section 360bbb-3(b)(1), unless the authorization is terminated or revoked.  Performed at [facility], [place] [address] [place], [place] [address]          Radiology Studies: CT ABDOMEN PELVIS WO CONTRAST  Result Date: 07/28/2021 CLINICAL DATA:  Acute nonlocalized abdominal pain. Persistent unexplained left flank pain. EXAM: CT ABDOMEN AND PELVIS WITHOUT CONTRAST TECHNIQUE: Multidetector CT imaging of the abdomen and pelvis was performed following the standard protocol without IV contrast. COMPARISON:  07/26/2021 FINDINGS: Lower chest: Tiny amount of pleural fluid on the right. Patchy density at the lung bases could be atelectasis or scarring. Cardiomegaly again seen with coronary artery calcification. Hepatobiliary: Liver parenchyma appears normal without contrast. Previous cholecystectomy. Pancreas: Normal  Spleen: Normal Adrenals/Urinary Tract: 1.5 cm left adrenal adenoma as seen previously. Mild renal cortical atrophy as seen previously. 2 mm nonobstructing stone in the lower pole of the left kidney. No hydronephrosis or passing stone. No stone in the bladder. Stomach/Bowel: Stomach and small intestine appear normal. Normal appendix. Diverticulosis of the colon without evidence of diverticulitis. Vascular/Lymphatic: Aortic atherosclerosis. No aneurysm. IVC is normal. No adenopathy. Reproductive: Calcified leiomyomas.  No significant pelvic finding. Other: No free fluid or air. Musculoskeletal: Previous ventral hernia repair. Ordinary chronic degenerative changes of the spine. Previous hip replacement on the left. Osteoarthritis of the right hip. IMPRESSION: No change or acute finding. 2 mm nonobstructing stone in the lower pole of the left kidney. Diverticulosis of the colon without evidence of diverticulitis. Aortic Atherosclerosis (ICD10-I70.0). Electronically Signed   By: Nelson Chimes M.D.   On: 07/28/2021 15:15   ECHOCARDIOGRAM COMPLETE  Result Date: 07/27/2021    ECHOCARDIOGRAM REPORT   Patient Name:   TANDRA ROSADO Haughey Date of Exam: 07/27/2021 Medical Rec #:  [id number]        Height:       70.0 in Accession #:    [id number]       Weight:       210.0 lb Date of Birth:  [birth year]        BSA:          2.131 m Patient Age:    43 years         BP:           117/42 mmHg Patient Gender: F                HR:           58 bpm. Exam Location:  Inpatient Procedure: 2D Echo, Cardiac Doppler, Color Doppler and Intracardiac            Opacification Agent Indications:    I51.7 Cardiomegaly  History:        Patient has no prior history of Echocardiogram examinations.                 CAD, Stroke; Risk Factors:Hypertension.  Sonographer:    Glo Herring Referring Phys: [id number] Reace Breshears IMPRESSIONS  1. Left ventricular ejection fraction, by estimation, is 60 to 65%. The left ventricle has normal function. The left  ventricle has no regional wall motion abnormalities. There is mild left ventricular hypertrophy. Left ventricular diastolic parameters are indeterminate.  2. Right ventricular systolic function is normal. The right  ventricular size is mildly enlarged. There is mildly elevated pulmonary artery systolic pressure. The estimated right ventricular systolic pressure is 53.6 mmHg.  3. Left atrial size was moderately dilated.  4. Right atrial size was severely dilated.  5. The mitral valve is normal in structure. Trivial mitral valve regurgitation. No evidence of mitral stenosis.  6. The aortic valve is tricuspid. Aortic valve regurgitation is not visualized. No aortic stenosis is present.  7. The inferior vena cava is dilated in size with >50% respiratory variability, suggesting right atrial pressure of 8 mmHg.  8. The patient was in atrial fibrillation. FINDINGS  Left Ventricle: Left ventricular ejection fraction, by estimation, is 60 to 65%. The left ventricle has normal function. The left ventricle has no regional wall motion abnormalities. The left ventricular internal cavity size was normal in size. There is  mild left ventricular hypertrophy. Left ventricular diastolic parameters are indeterminate. Right Ventricle: The right ventricular size is mildly enlarged. No increase in right ventricular wall thickness. Right ventricular systolic function is normal. There is mildly elevated pulmonary artery systolic pressure. The tricuspid regurgitant velocity is 2.93 m/s, and with an assumed right atrial pressure of 8 mmHg, the estimated right ventricular systolic pressure is 64.4 mmHg. Left Atrium: Left atrial size was moderately dilated. Right Atrium: Right atrial size was severely dilated. Pericardium: There is no evidence of pericardial effusion. Mitral Valve: The mitral valve is normal in structure. There is mild calcification of the mitral valve leaflet(s). Mild mitral annular calcification. Trivial mitral valve  regurgitation. No evidence of mitral valve stenosis. Tricuspid Valve: The tricuspid valve is normal in structure. Tricuspid valve regurgitation is mild. Aortic Valve: The aortic valve is tricuspid. Aortic valve regurgitation is not visualized. No aortic stenosis is present. Aortic valve mean gradient measures 4.7 mmHg. Aortic valve peak gradient measures 9.6 mmHg. Aortic valve area, by VTI measures 2.81 cm. Pulmonic Valve: The pulmonic valve was normal in structure. Pulmonic valve regurgitation is trivial. Aorta: The aortic root is normal in size and structure. Venous: The inferior vena cava is dilated in size with greater than 50% respiratory variability, suggesting right atrial pressure of 8 mmHg. IAS/Shunts: No atrial level shunt detected by color flow Doppler.  LEFT VENTRICLE PLAX 2D LVIDd:         4.30 cm   Diastology LVIDs:         2.80 cm   LV e' medial:    6.45 cm/s LV PW:         1.40 cm   LV E/e' medial:  22.7 LV IVS:        1.40 cm   LV e' lateral:   11.27 cm/s LVOT diam:     2.00 cm   LV E/e' lateral: 13.0 LV SV:         92 LV SV Index:   43 LVOT Area:     3.14 cm  RIGHT VENTRICLE             IVC RV Basal diam:  4.40 cm     IVC diam: 2.80 cm RV S prime:     10.03 cm/s LEFT ATRIUM             Index        RIGHT ATRIUM           Index LA diam:        4.30 cm 2.02 cm/m   RA Area:     29.10 cm LA Vol (A2C):   66.3 ml  31.11 ml/m  RA Volume:   106.00 ml 49.74 ml/m LA Vol (A4C):   85.8 ml 40.26 ml/m LA Biplane Vol: 81.4 ml 38.20 ml/m  AORTIC VALVE                     PULMONIC VALVE AV Area (Vmax):    2.62 cm      PV Vmax:       1.38 m/s AV Area (Vmean):   2.53 cm      PV Peak grad:  7.6 mmHg AV Area (VTI):     2.81 cm AV Vmax:           155.00 cm/s AV Vmean:          103.500 cm/s AV VTI:            0.327 m AV Peak Grad:      9.6 mmHg AV Mean Grad:      4.7 mmHg LVOT Vmax:         129.33 cm/s LVOT Vmean:        83.200 cm/s LVOT VTI:          0.293 m LVOT/AV VTI ratio: 0.90  AORTA Ao Root diam: 3.30 cm  Ao Asc diam:  3.40 cm MITRAL VALVE                TRICUSPID VALVE MV Area (PHT): 3.43 cm     TR Peak grad:   34.3 mmHg MV Decel Time: 221 msec     TR Vmax:        293.00 cm/s MV E velocity: 146.67 cm/s                             SHUNTS                             Systemic VTI:  0.29 m                             Systemic Diam: 2.00 cm Dalton McleanMD Electronically signed by Franki Monte Signature Date/Time: 07/27/2021/4:51:25 PM    Final         Scheduled Meds:  acetaminophen-codeine  1 tablet Oral Once   aspirin EC  81 mg Oral Daily   Barium Sulfate  450 mL Oral BID   guaiFENesin  600 mg Oral BID   heparin  5,000 Units Subcutaneous Q8H   HYDROcodone-acetaminophen  1 tablet Oral Once   insulin aspart  0-5 Units Subcutaneous QHS   insulin aspart  0-9 Units Subcutaneous TID WC   metoprolol tartrate  12.5 mg Oral BID   simvastatin  10 mg Oral QHS   sodium bicarbonate  650 mg Oral Daily   tamsulosin  0.4 mg Oral Daily   Continuous Infusions:  cefTRIAXone (ROCEPHIN)  IV 1 g (07/28/21 1744)   methocarbamol (ROBAXIN) IV       LOS: 2 days    Time spent:25 mins. More than 50% of that time was spent in counseling and/or coordination of care.      Shelly Coss, MD [facility] P11/18/2022, 7:36 AM

## 2021-07-29 NOTE — Consult Note (Signed)
NAME:  NEVAEHA White, MRN:  [id number], DOB:  [birth year], LOS: 2 ADMISSION DATE:  07/25/2021, CONSULTATION DATE:  07/29/2021 REFERRING MD:  Dr. Tawanna Solo, CHIEF COMPLAINT:  Persistent hypoxia    History of Present Illness:  Bianca White is a 75 year old female with a past medical history significant for CAD status post DES, prior CVA, HTN, HLD, insulin-dependent type 2 diabetes, and paroxysmal atrial fibrillation not on chronic anticoagulation who presented to the emergency department 11/14 for complaints of left flank/pleuritic chest pain.  Work-up thus far has ruled out likely pneumonia and significant renal calculi.  Given mildly elevated D-dimer VQ scan was also obtained and negative for any PE.  Initially patient was deemed medically appropriate for ED from discharge but she was seen persistently mildly hypoxic on room air which prompted admission.  Mid morning of 11/18 PCCM consulted for pulmonary consult given continued hypoxia with need of supplemental oxygen.  Patient denies any known pulmonary history including no COPD, no OSA, no asthma.  Denies any occupational exposures.  No in-home pets.  Denies any dysphagia or aspiration events  Pertinent  Medical History  CAD status post DES, prior CVA, HTN, HLD, insulin-dependent type 2 diabetes, and paroxysmal atrial fibrillation not on chronic anticoagulation  Significant Hospital Events: Including procedures, antibiotic start and stop dates in addition to other pertinent events   11/14 admitted for complaints of left flank/pleuritic chest pain found to be hypoxic on room air 11/18 PCCM consulted for persistent hypoxia  Interim History / Subjective:  Seen sitting up in bedside recliner with no acute complaints Denies any dyspnea Reports productive cough with thick tan secretions  Objective   Blood pressure (!) 122/55, pulse (!) 50, temperature 97.8 F (36.6 C), temperature source Oral, resp. rate 20, height 5\' 1"  (1.549 m), weight  95.7 kg, SpO2 100 %.        Intake/Output Summary (Last 24 hours) at 07/29/2021 1250 Last data filed at 07/29/2021 0900 Gross per 24 hour  Intake 580 ml  Output 1700 ml  Net -1120 ml   Filed Weights   07/27/21 1819 07/29/21 0500 07/29/21 0504  Weight: 95.2 kg 95.7 kg 95.7 kg    Examination: General: Acute on chronically ill appearing elderly female sitting up in bedside recliner, in NAD HEENT: [place]/AT, MM pink/moist, PERRL,  Neuro: Alert and oriented x3, nonfocal CV: s1s2 regular rate and rhythm, no murmur, rubs, or gallops,  PULM: Clear to auscultation bilaterally, mildly diminished bilateral bases, observed adequate use of I-S GI: soft, bowel sounds active in all 4 quadrants, non-tender, non-distended, tolerating oral diet Extremities: warm/dry, no edema  Skin: no rashes or lesions   Resolved Hospital Problem list     Assessment & Plan:  Persistent acute hypoxic respiratory failure -Patient denies any chronic use of supplemental oxygen at baseline.  Since admission she has consistently required 2 L nasal cannula to prevent desaturation. -VQ scan ruled out PE -Chest x-ray and CT with cardiomegaly -Etiology likely multifactorial to include some component of cardiogenic pulmonary edema and V/Q mismatch  P: Goal of net even Appears kidneys may need break from diuresis  Strict intake and output Daily weight, prefer standing weight Repeat BMP Frequent use of I-S and flutter valve Check RVP Can consider sending sputum culture if patient continues to produce sputum.  However no clinical suspicion for bacterial infection Patient will likely require supplemental oxygen upon discharge   Best Practice (right click and "Reselect all SmartList Selections" daily)   Diet/type: Regular consistency (see orders)  DVT prophylaxis: LMWH GI prophylaxis: N/A Lines: N/A Foley:  N/A Code Status:  full code Last date of multidisciplinary goals of care discussion Per primary   Labs    CBC: Recent Labs  Lab 07/25/21 1802 07/27/21 0408 07/29/21 1200  WBC 5.9 7.0 4.6  HGB 12.0 10.6* 9.5*  HCT 38.3 35.6* 31.6*  MCV 92.3 94.9 96.0  PLT 195 157 967    Basic Metabolic Panel: Recent Labs  Lab 07/25/21 1802 07/26/21 1653 07/27/21 0408 07/28/21 0339 07/29/21 0339  NA 140 140 142 140 138  K 4.4 4.8 4.5 4.6 4.3  CL 105 107 109 106 103  CO2 28 26 25 25 29   GLUCOSE 104* 113* 148* 142* 130*  BUN 54* 51* 52* 55* 63*  CREATININE 2.23* 2.37* 2.29* 2.81* 3.73*  CALCIUM 9.5 9.3 9.1 9.3 8.8*   GFR: Estimated Creatinine Clearance: 13.4 mL/min (A) (by C-G formula based on SCr of 3.73 mg/dL (H)). Recent Labs  Lab 07/25/21 1802 07/27/21 0408 07/29/21 1200  WBC 5.9 7.0 4.6    Liver Function Tests: Recent Labs  Lab 07/25/21 1846  AST 16  ALT 12  ALKPHOS 60  BILITOT 1.0  PROT 7.1  ALBUMIN 3.8   No results for input(s): LIPASE, AMYLASE in the last 168 hours. No results for input(s): AMMONIA in the last 168 hours.  ABG    Component Value Date/Time   PHART 7.395 04/29/2009 1500   PCO2ART 40.4 04/29/2009 1500   PO2ART 85.9 04/29/2009 1500   HCO3 24.2 (H) 04/29/2009 1500   TCO2 25.4 04/29/2009 1500   ACIDBASEDEF 0.0 04/29/2009 1500   O2SAT 97.1 04/29/2009 1500     Coagulation Profile: Recent Labs  Lab 07/25/21 1846  INR 1.1    Cardiac Enzymes: No results for input(s): CKTOTAL, CKMB, CKMBINDEX, TROPONINI in the last 168 hours.  HbA1C: Hgb A1c MFr Bld  Date/Time Value Ref Range Status  07/25/2021 06:02 PM 6.0 (H) 4.8 - 5.6 % Final    Comment:    (NOTE) Pre diabetes:          5.7%-6.4%  Diabetes:              >6.4%  Glycemic control for   <7.0% adults with diabetes   04/27/2009 09:30 PM (H) 4.6 - 6.1 % Final   7.5 (NOTE) The ADA recommends the following therapeutic goal for glycemic control related to Hgb A1c measurement: Goal of therapy: <6.5 Hgb A1c  Reference: American Diabetes Association: Clinical Practice Recommendations 2010,  Diabetes Care, 2010, 33: (Suppl  1).    CBG: Recent Labs  Lab 07/28/21 1201 07/28/21 1705 07/28/21 2137 07/29/21 0746 07/29/21 1131  GLUCAP 201* 116* 147* 113* 212*    Review of Systems:   Please see the history of present illness. All other systems reviewed and are negative   Past Medical History:  She,  has a past medical history of Carotid artery disease ([place]), Coronary atherosclerosis of native coronary artery, CVA (cerebral infarction), Essential hypertension, benign, Hyperlipidemia, Paroxysmal atrial fibrillation ([place]), Skin abnormality, Stroke ([employer]), and Type 2 diabetes mellitus ([place]).   Surgical History:   Past Surgical History:  Procedure Laterality Date   Bilateral carpal tunnel release     Cervical neck fusion     CHOLECYSTECTOMY     HERNIA REPAIR     LEFT HEART CATHETERIZATION WITH CORONARY ANGIOGRAM N/A 11/25/2012   Procedure: LEFT HEART CATHETERIZATION WITH CORONARY ANGIOGRAM;  Surgeon: Hillary Bow, MD;  Location: [facility] CATH LAB;  Service:  Cardiovascular;  Laterality: N/A;   PERCUTANEOUS CORONARY STENT INTERVENTION (PCI-S)  11/25/2012   Procedure: PERCUTANEOUS CORONARY STENT INTERVENTION (PCI-S);  Surgeon: Hillary Bow, MD;  Location: [facility] CATH LAB;  Service: Cardiovascular;;   TOTAL HIP ARTHROPLASTY Left 11/24/2014   Procedure: LEFT TOTAL HIP ARTHROPLASTY ANTERIOR APPROACH;  Surgeon: Paralee Cancel, MD;  Location: [facility] ORS;  Service: Orthopedics;  Laterality: Left;     Social History:   reports that she has never smoked. She has never used smokeless tobacco. She reports that she does not drink alcohol and does not use drugs.   Family History:  Her family history includes Stroke in her father and mother.   Allergies Allergies  Allergen Reactions   Ciprofloxacin Other (See Comments) and Palpitations   Insulin Detemir Swelling and Other (See Comments)   Nsaids     Due to CKD   Stadol [Butorphanol]     Went crazy - hallucinations   Sulfa Antibiotics Other  (See Comments)    Due to CKD     Home Medications  Prior to Admission medications   Medication Sig Start Date End Date Taking? Authorizing Provider  aspirin EC 81 MG tablet Take 81 mg by mouth daily. Swallow whole.   Yes [provider]  docusate sodium (COLACE) 100 MG capsule Take 1 capsule (100 mg total) by mouth 2 (two) times daily. Patient taking differently: Take 100 mg by mouth daily as needed for mild constipation. 11/27/14  Yes Babish, Rodman Key, PA-C  hydrALAZINE (APRESOLINE) 50 MG tablet Take 100 mg by mouth 2 (two) times daily. 06/08/21  Yes [provider]  losartan (COZAAR) 50 MG tablet Take 50 mg by mouth daily. 04/19/15  Yes [provider]  metoprolol tartrate (LOPRESSOR) 25 MG tablet Take 25 mg by mouth 2 (two) times daily. 02/04/21  Yes [provider]  simvastatin (ZOCOR) 10 MG tablet Take 10 mg by mouth at bedtime.   Yes [provider]  sodium bicarbonate 650 MG tablet Take 650 mg by mouth daily. 06/14/21  Yes [provider]  TRESIBA FLEXTOUCH 100 UNIT/ML FlexTouch Pen Inject 30 Units into the skin daily. 05/26/21  Yes [provider]  VITAMIN D PO Take 4,000 Units by mouth daily.   Yes [provider]     Critical care time:  NA  Miosotis Wetsel D. Kenton Kingfisher, NP-C [facility] Personal contact information can be found on Amion  07/29/2021, 1:11 PM

## 2021-07-29 NOTE — Progress Notes (Signed)
Pt's BP 122/55 this AM, HR 50s. Pt having frequent 2-2.5 second pauses per tele, pt asymptomatic. Still in A-Fib. MD Adhikari made aware, gave VO to hold AM metoprolol. Will continue to monitor.

## 2021-07-30 LAB — CBC WITH DIFFERENTIAL/PLATELET
Abs Immature Granulocytes: 0.01 10*3/uL (ref 0.00–0.07)
Basophils Absolute: 0 10*3/uL (ref 0.0–0.1)
Basophils Relative: 0 %
Eosinophils Absolute: 0.1 10*3/uL (ref 0.0–0.5)
Eosinophils Relative: 3 %
HCT: 31 % — ABNORMAL LOW (ref 36.0–46.0)
Hemoglobin: 9.5 g/dL — ABNORMAL LOW (ref 12.0–15.0)
Immature Granulocytes: 0 %
Lymphocytes Relative: 28 %
Lymphs Abs: 1.2 10*3/uL (ref 0.7–4.0)
MCH: 28.6 pg (ref 26.0–34.0)
MCHC: 30.6 g/dL (ref 30.0–36.0)
MCV: 93.4 fL (ref 80.0–100.0)
Monocytes Absolute: 0.4 10*3/uL (ref 0.1–1.0)
Monocytes Relative: 9 %
Neutro Abs: 2.7 10*3/uL (ref 1.7–7.7)
Neutrophils Relative %: 60 %
Platelets: 142 10*3/uL — ABNORMAL LOW (ref 150–400)
RBC: 3.32 MIL/uL — ABNORMAL LOW (ref 3.87–5.11)
RDW: 14.1 % (ref 11.5–15.5)
WBC: 4.4 10*3/uL (ref 4.0–10.5)
nRBC: 0 % (ref 0.0–0.2)

## 2021-07-30 LAB — EXPECTORATED SPUTUM ASSESSMENT W GRAM STAIN, RFLX TO RESP C

## 2021-07-30 LAB — BASIC METABOLIC PANEL
Anion gap: 8 (ref 5–15)
BUN: 67 mg/dL — ABNORMAL HIGH (ref 8–23)
CO2: 29 mmol/L (ref 22–32)
Calcium: 8.7 mg/dL — ABNORMAL LOW (ref 8.9–10.3)
Chloride: 100 mmol/L (ref 98–111)
Creatinine, Ser: 3.64 mg/dL — ABNORMAL HIGH (ref 0.44–1.00)
GFR, Estimated: 12 mL/min — ABNORMAL LOW (ref >60)
Glucose, Bld: 139 mg/dL — ABNORMAL HIGH (ref 70–99)
Potassium: 4.4 mmol/L (ref 3.5–5.1)
Sodium: 137 mmol/L (ref 135–145)

## 2021-07-30 LAB — GLUCOSE, CAPILLARY
Glucose-Capillary: 125 mg/dL — ABNORMAL HIGH (ref 70–99)
Glucose-Capillary: 171 mg/dL — ABNORMAL HIGH (ref 70–99)
Glucose-Capillary: 115 mg/dL — ABNORMAL HIGH (ref 70–99)
Glucose-Capillary: 184 mg/dL — ABNORMAL HIGH (ref 70–99)

## 2021-07-30 MED ORDER — FUROSEMIDE 10 MG/ML IJ SOLN
40.0000 mg | Freq: Two times a day (BID) | INTRAMUSCULAR | Status: DC
  Administered 2021-07-30 – 2021-08-02 (×7): 40 mg via INTRAVENOUS
  Filled 2021-07-30 (×7): qty 4

## 2021-07-30 MED ORDER — SALINE SPRAY 0.65 % NA SOLN
1.0000 | NASAL | Status: DC | PRN
  Filled 2021-07-30: qty 44

## 2021-07-30 NOTE — Progress Notes (Signed)
[facility] Progress Note  Subjective: no new c/o, creat down to 3.4 this am. Seen by CCM, no other cause than vol overload/ pulm edema per their report is likely. 1.5 cc UOP yest.   Vitals:   07/29/21 2130 07/29/21 2145 07/29/21 2159 07/30/21 0531  BP:    (!) 123/59  Pulse:   88 62  Resp: 13 15  18   Temp:    97.9 F (36.6 C)  TempSrc:    Oral  SpO2:    99%  Weight:    95.2 kg  Height:        Exam: General: WDWN NAD Lungs: Decreased breath sounds bilaterally. Breathing is unlabored. Heart: RRR. No murmur, rubs or gallops, no JVD.  Abdomen: soft, nontender, nondistended, +BS, M/S:  Equal strength b/l in upper and lower extremities.  Lower extremities: 1+ pretibial edema in LLE, 2+ pretibial and hip edema in RLE.   UA 11/15 - 100 prot, rare bact, 0-5 rbc, 6-10 wbc   UNa 102, UCr 33 (on lasix)   CT abd 11/17 - Urinary Tract: Mild renal cortical atrophy as seen previously. 2 mm nonobstructing stone in the lower pole of the left kidney. No hydronephrosis or passing stone. No stone in the bladder.  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 Creat 2.2 on admission when pt came in w/ SOB and hypoxemia. UA unremarkable, no obstruction by CT abdomen. Had initial good UOP response w/ IV lasix 80 mg bid, although creat rose up to 2.9 then 3.8 yesterday.  Held diuretics yesterday, creat down slightly today. O2 requirement seems to be improving. Seen by pulm team, they feel pulm edema is most likely the cause of hypoxemia given ^^BNP and no other cause seen by CT chest. AKI likely due to soft BP's (has had lowering of BP meds as below) and concurrent IV diuresis. Still requiring O2, will resume IV lasix at lower dose 40 mg bid. Follow labs in am.  Acute respiratory failure with hypoxia- initial CXR to our read there was + vasc congestion.  BNP 378.9 on 11/15. Continue O2 [place]. As above.  Volume overload- +leg edema, hypoxia presumed due to [place] water. Pt has been  pushing fluids at home per advice of her kidney doctor (6 quarts per day). See above as well.  Hypertension- BP were soft here so pt's hydralazine and losartan were stopped and metoprolol (afib) was lowered 25 bid to 12.5 bid.  Hx of PAF- bradycardia during encounter. Hold parameters on metoprolol. 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  Hx of CVA- some right sided residual weakness, uses a cane. Continue Aspirin. Hyperlipidemia- Continue Simvastatin     Rob Geraldine Sandberg 07/30/2021, 9:59 AM   Recent Labs  Lab 07/29/21 0339 07/29/21 1200 07/30/21 0346  K 4.3  --  4.4  BUN 63*  --  67*  CREATININE 3.73*  --  3.64*  CALCIUM 8.8*  --  8.7*  HGB  --  9.5* 9.5*    Inpatient medications:  acetaminophen-codeine  1 tablet Oral Once   aspirin EC  81 mg Oral Daily   Barium Sulfate  450 mL Oral BID   guaiFENesin  600 mg Oral BID   heparin  5,000 Units Subcutaneous Q8H   HYDROcodone-acetaminophen  1 tablet Oral Once   insulin aspart  0-5 Units Subcutaneous QHS   insulin aspart  0-9 Units Subcutaneous TID WC   metoprolol tartrate  12.5 mg Oral BID   simvastatin  10  mg Oral QHS   sodium bicarbonate  650 mg Oral Daily   tamsulosin  0.4 mg Oral Daily    cefTRIAXone (ROCEPHIN)  IV 1 g (07/29/21 1736)   methocarbamol (ROBAXIN) IV     HYDROmorphone (DILAUDID) injection, methocarbamol (ROBAXIN) IV, ondansetron (ZOFRAN) IV, oxyCODONE, sodium chloride

## 2021-07-30 NOTE — Progress Notes (Signed)
PROGRESS NOTE    Bianca White  TOI:[id number] DOB: September 04, 1946 DOA: 07/25/2021 PCP: Michell Heinrich, DO   Chief Complain:Right flank pain  Brief Narrative: Bianca White is a 75 y.o. female with medical history significant of coronary artery disease status post stent placement to mid RCA, paroxysmal A. fib not on anticoagulation due to history of spinal cord bleed, CKD stage IV, history of CVA with residual right-sided weakness, intermittent bradycardia while on beta-blocker, insulin-dependent diabetes type 2, hypertension, hyperlipidemia who presented to the emergency department with complaint of back pain and pleuritic chest pain.  She was complaining of left lower back/flank pain and complained about radiation to the left abdominal area.  She also reported shortness of breath along with her left flank pain. Chest x-ray did not show any pneumonia.  CT chest without contrast showed few scattered small vague groundglass nodular density in both upper lobes consistent with small airway disease/alveolitis, cardiomegaly. She was found to have mildly  elevated D-dimer.  CT chest for PE could not be done due to CKD status.  VQ scan was obtained which did not show any ventilation/perfusion mismatch.  Patient was deferred admission by our team but as per ED physician, she persistently desaturated on room air requiring 2 L of oxygen.  Patient was admitted for further evaluation and management for hypoxia.PCCM ,nephrology following here.  Assessment & Plan:   Principal Problem:   Acute respiratory failure with hypoxia (HCC) Active Problems:   Essential hypertension, benign   Hyperlipidemia   History of CVA (cerebrovascular accident)   Paroxysmal atrial fibrillation (HCC)   CAD (coronary artery disease), native coronary artery  Acute respiratory failure with hypoxia:   Does not use oxygen at home.  She was complain of shortness of breath on minimal exertion.  Chest x-ray done on 07/25/2021 did not  show any pneumonia or pulmonary edema.  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 VQ scan did not suggest PE.  Venous Doppler did not show any DVT. Will continue supplemental oxygen for now..  We were  empirically treating for community-acquired pneumonia, though suspicion for pneumonia is low.  On ceftriaxone, day 5/5. CT scan showed features of alveolitis .since VQ scan is negative, no indication for starting on anticoagulation.  She also has history of spinal cord bleeding, so she is not currently taking any anticoagulants for paroxysmal A. fib. Patient looked volume overloaded.  BNP elevated,started on IV Lasix - held because creatinine creeped up-now resumed again at 40 mg iv bid repeat chest x-ray did not show any acute findings.  Echo shows EF of 60 to 65%, indeterminate left ventricular diastolic parameters, Mildly elevated pulmonary artery systolic pressure. Ideally we need to do CT angiogram to rule out PE for investigation of her hypoxia but her kidney function does not allow that.CT chest without contrast on 11/18 showed scattered ground-glass nodular densities predominantly involving the upper lobes similar to the prior CT suspicious for an infectious process.  Respiratory viral panel has been sent.  We will get a sputum culture also. Pulmonary hypertension is a possibility, will recommend sleep study as an outpatient for evaluation of OSA, outpatient pulmonary function test.  She needs to follow-up with pulmonology as an outpatient   Left flank pain: CT renal study showed small nonobstructing left renal calculus. No evidence of ureteral calculus. Started on tamsulosin.  She complains of persistent left flank pain.  I discussed the case with Dr.  Gay, urology, he said there is nothing to offer from urology and is unlikely that the small stone is contributing to the pain.  We  repeated CT  abdomen/pelvis :  no new finding.  We will do CT chest without contrast today.   continue supportive care, pain management   History of hypertension: Takes hydralazine, losartan, metoprolol at home.  Blood pressure soft.  Discontinue hydralazine, losartan.  Decreased the dose of metoprolol  AKI on CKD stage IV:   Baseline creatinine around 2.5.  Now creeped up to the range of 3.Continue sodium bicarb tablets that she takes at home.  Monitor kidney function.She is s/p right radial aVF created 11/17/2020.  She follows with [place] [facility].  Nephrology consulted and following   Diabetes type 2: On Tresiba at home.  Monitor blood sugars.  Continue sliding scale insulin here.   History of paroxysmal A. fib:  On metoprolol for rate control.  Does not take anticoagulant due to history of spinal cord bleeding   History of nonhemorrhagic stroke: Has residual weakness on the right side.  Takes aspirin at home.   History of hyperlipidemia: Takes simvastatin  Obesity: BMI of 39  Debility/deconditioning: Patient seen by PT/OT and recommended skilled nursing facility on discharge.  Patient and family are agreeable          DVT prophylaxis:Heparin [place] Code Status: Full Family Communication: Husband at bedside on 07/30/21 Status is: Inpatient     Consultants: Nephrology   Procedures:None  Antimicrobials:  Anti-infectives (From admission, onward)    Start     Dose/Rate Route Frequency Ordered Stop   07/26/21 1700  azithromycin (ZITHROMAX) tablet 500 mg  Status:  Discontinued        500 mg Oral Daily 07/26/21 1653 07/29/21 0734   07/26/21 1700  cefTRIAXone (ROCEPHIN) 1 g in sodium chloride 0.9 % 100 mL IVPB        1 g 200 mL/hr over 30 Minutes Intravenous Every 24 hours 07/26/21 1653 07/31/21 1659       Subjective:  Patient seen and examined at the bedside this morning.  She was sitting on the chair.  She feels little better than yesterday.  She still complains of left-sided flank  pain when she moves.  Husband at the bedside.  Still on 2 L of oxygen per minute  Objective: Vitals:   07/29/21 2130 07/29/21 2145 07/29/21 2159 07/30/21 0531  BP:    (!) 123/59  Pulse:   88 62  Resp: 13 15  18   Temp:    97.9 F (36.6 C)  TempSrc:    Oral  SpO2:    99%  Weight:    95.2 kg  Height:        Intake/Output Summary (Last 24 hours) at 07/30/2021 0729 Last data filed at 07/29/2021 1900 Gross per 24 hour  Intake 720 ml  Output 1000 ml  Net -280 ml    Filed Weights   07/29/21 0500 07/29/21 0504 07/30/21 0531  Weight: 95.7 kg 95.7 kg 95.2 kg    Examination:   General exam: Pleasant elderly female, deconditioned, obese HEENT: PERRL Respiratory system: Mild bibasilar crackles Cardiovascular system: Irregular rhythm.  Gastrointestinal system: Abdomen is nondistended, soft and nontender. Central nervous system: Alert and oriented Extremities: Bilateral lower extremity edema, no clubbing ,no cyanosis Skin: No rashes, no ulcers,no icterus     Data Reviewed: I have personally reviewed following labs and imaging studies  CBC: Recent Labs  Lab 07/25/21 1802 07/27/21 0408 07/29/21 1200  07/30/21 0346  WBC 5.9 7.0 4.6 4.4  NEUTROABS  --   --   --  2.7  HGB 12.0 10.6* 9.5* 9.5*  HCT 38.3 35.6* 31.6* 31.0*  MCV 92.3 94.9 96.0 93.4  PLT 195 157 162 202*   Basic Metabolic Panel: Recent Labs  Lab 07/26/21 1653 07/27/21 0408 07/28/21 0339 07/29/21 0339 07/30/21 0346  NA 140 142 140 138 137  K 4.8 4.5 4.6 4.3 4.4  CL 107 109 106 103 100  CO2 26 25 25 29 29   GLUCOSE 113* 148* 142* 130* 139*  BUN 51* 52* 55* 63* 67*  CREATININE 2.37* 2.29* 2.81* 3.73* 3.64*  CALCIUM 9.3 9.1 9.3 8.8* 8.7*   GFR: Estimated Creatinine Clearance: 13.6 mL/min (A) (by C-G formula based on SCr of 3.64 mg/dL (H)). Liver Function Tests: Recent Labs  Lab 07/25/21 1846  AST 16  ALT 12  ALKPHOS 60  BILITOT 1.0  PROT 7.1  ALBUMIN 3.8   No results for input(s): LIPASE,  AMYLASE in the last 168 hours. No results for input(s): AMMONIA in the last 168 hours. Coagulation Profile: Recent Labs  Lab 07/25/21 1846  INR 1.1   Cardiac Enzymes: No results for input(s): CKTOTAL, CKMB, CKMBINDEX, TROPONINI in the last 168 hours. BNP (last 3 results) No results for input(s): PROBNP in the last 8760 hours. HbA1C: No results for input(s): HGBA1C in the last 72 hours.  CBG: Recent Labs  Lab 07/28/21 2137 07/29/21 0746 07/29/21 1131 07/29/21 1640 07/29/21 2100  GLUCAP 147* 113* 212* 139* 179*   Lipid Profile: No results for input(s): CHOL, HDL, LDLCALC, TRIG, CHOLHDL, LDLDIRECT in the last 72 hours. Thyroid Function Tests: No results for input(s): TSH, T4TOTAL, FREET4, T3FREE, THYROIDAB in the last 72 hours. Anemia Panel: No results for input(s): VITAMINB12, FOLATE, FERRITIN, TIBC, IRON, RETICCTPCT in the last 72 hours. Sepsis Labs: No results for input(s): PROCALCITON, LATICACIDVEN in the last 168 hours.  Recent Results (from the past 240 hour(s))  Resp Panel by RT-PCR (Flu A&B, Covid) Nasopharyngeal Swab     Status: None   Collection Time: 07/26/21  1:00 PM   Specimen: Nasopharyngeal Swab; Nasopharyngeal(NP) swabs in vial transport medium  Result Value Ref Range Status   SARS Coronavirus 2 by RT PCR NEGATIVE NEGATIVE Final    Comment: (NOTE) SARS-CoV-2 target nucleic acids are NOT DETECTED.  The SARS-CoV-2 RNA is generally detectable in upper respiratory specimens during the acute phase of infection. The lowest concentration of SARS-CoV-2 viral copies this assay can detect is 138 copies/mL. A negative result does not preclude SARS-Cov-2 infection and should not be used as the sole basis for treatment or other patient management decisions. A negative result may occur with  improper specimen collection/handling, submission of specimen other than nasopharyngeal swab, presence of viral mutation(s) within the areas targeted by this assay, and  inadequate number of viral copies(<138 copies/mL). A negative result must be combined with clinical observations, patient history, and epidemiological information. The expected result is Negative.  Fact Sheet for Patients:  EntrepreneurPulse.com.au  Fact Sheet for Healthcare Providers:  IncredibleEmployment.be  This test is no t yet approved or cleared by the [place] FDA and  has been authorized for detection and/or diagnosis of SARS-CoV-2 by FDA under an Emergency Use Authorization (EUA). This EUA will remain  in effect (meaning this test can be used) for the duration of the COVID-19 declaration under Section 564(b)(1) of the Act, 21 U.S.C.section 360bbb-3(b)(1), unless the authorization is terminated  or revoked sooner.  Influenza A by PCR NEGATIVE NEGATIVE Final   Influenza B by PCR NEGATIVE NEGATIVE Final    Comment: (NOTE) The Xpert Xpress SARS-CoV-2/FLU/RSV plus assay is intended as an aid in the diagnosis of influenza from Nasopharyngeal swab specimens and should not be used as a sole basis for treatment. Nasal washings and aspirates are unacceptable for Xpert Xpress SARS-CoV-2/FLU/RSV testing.  Fact Sheet for Patients: EntrepreneurPulse.com.au  Fact Sheet for Healthcare Providers: IncredibleEmployment.be  This test is not yet approved or cleared by the [place] FDA and has been authorized for detection and/or diagnosis of SARS-CoV-2 by FDA under an Emergency Use Authorization (EUA). This EUA will remain in effect (meaning this test can be used) for the duration of the COVID-19 declaration under Section 564(b)(1) of the Act, 21 U.S.C. section 360bbb-3(b)(1), unless the authorization is terminated or revoked.  Performed at [facility], [place] [address] [place], [place] [address]   Respiratory (~20 pathogens) panel by PCR     Status: None   Collection Time:  07/29/21  1:18 PM   Specimen: Nasopharyngeal Swab; Respiratory  Result Value Ref Range Status   Adenovirus NOT DETECTED NOT DETECTED Final   Coronavirus 229E NOT DETECTED NOT DETECTED Final    Comment: (NOTE) The Coronavirus on the Respiratory Panel, DOES NOT test for the novel  Coronavirus (2019 nCoV)    Coronavirus HKU1 NOT DETECTED NOT DETECTED Final   Coronavirus NL63 NOT DETECTED NOT DETECTED Final   Coronavirus OC43 NOT DETECTED NOT DETECTED Final   Metapneumovirus NOT DETECTED NOT DETECTED Final   Rhinovirus / Enterovirus NOT DETECTED NOT DETECTED Final   Influenza A NOT DETECTED NOT DETECTED Final   Influenza B NOT DETECTED NOT DETECTED Final   Parainfluenza Virus 1 NOT DETECTED NOT DETECTED Final   Parainfluenza Virus 2 NOT DETECTED NOT DETECTED Final   Parainfluenza Virus 3 NOT DETECTED NOT DETECTED Final   Parainfluenza Virus 4 NOT DETECTED NOT DETECTED Final   Respiratory Syncytial Virus NOT DETECTED NOT DETECTED Final   Bordetella pertussis NOT DETECTED NOT DETECTED Final   Bordetella Parapertussis NOT DETECTED NOT DETECTED Final   Chlamydophila pneumoniae NOT DETECTED NOT DETECTED Final   Mycoplasma pneumoniae NOT DETECTED NOT DETECTED Final    Comment: Performed at [facility], [place]. [address] [place], [place] [address]         Radiology Studies: CT ABDOMEN PELVIS WO CONTRAST  Result Date: 07/28/2021 CLINICAL DATA:  Acute nonlocalized abdominal pain. Persistent unexplained left flank pain. EXAM: CT ABDOMEN AND PELVIS WITHOUT CONTRAST TECHNIQUE: Multidetector CT imaging of the abdomen and pelvis was performed following the standard protocol without IV contrast. COMPARISON:  07/26/2021 FINDINGS: Lower chest: Tiny amount of pleural fluid on the right. Patchy density at the lung bases could be atelectasis or scarring. Cardiomegaly again seen with coronary artery calcification. Hepatobiliary: Liver parenchyma appears normal without contrast. Previous  cholecystectomy. Pancreas: Normal Spleen: Normal Adrenals/Urinary Tract: 1.5 cm left adrenal adenoma as seen previously. Mild renal cortical atrophy as seen previously. 2 mm nonobstructing stone in the lower pole of the left kidney. No hydronephrosis or passing stone. No stone in the bladder. Stomach/Bowel: Stomach and small intestine appear normal. Normal appendix. Diverticulosis of the colon without evidence of diverticulitis. Vascular/Lymphatic: Aortic atherosclerosis. No aneurysm. IVC is normal. No adenopathy. Reproductive: Calcified leiomyomas.  No significant pelvic finding. Other: No free fluid or air. Musculoskeletal: Previous ventral hernia repair. Ordinary chronic degenerative changes of the spine. Previous hip replacement on the left. Osteoarthritis of  the right hip. IMPRESSION: No change or acute finding. 2 mm nonobstructing stone in the lower pole of the left kidney. Diverticulosis of the colon without evidence of diverticulitis. Aortic Atherosclerosis (ICD10-I70.0). Electronically Signed   By: Nelson Chimes M.D.   On: 07/28/2021 15:15   CT CHEST WO CONTRAST  Result Date: 07/29/2021 CLINICAL DATA:  Chest pain and shortness of breath. EXAM: CT CHEST WITHOUT CONTRAST TECHNIQUE: Multidetector CT imaging of the chest was performed following the standard protocol without IV contrast. COMPARISON:  Chest CT dated 07/25/2021. FINDINGS: Evaluation of this exam is limited in the absence of intravenous contrast. Cardiovascular: Mild cardiomegaly. No pericardial effusion. Three-vessel coronary vascular calcification. Moderate atherosclerotic calcification of the thoracic aorta. No aneurysmal dilatation. There is mild dilatation of the main pulmonary trunk suggestive of pulmonary hypertension. Clinical correlation is recommended. Mediastinum/Nodes: No hilar or mediastinal adenopathy. The esophagus and the thyroid gland are grossly unremarkable. No mediastinal fluid collection. Lungs/Pleura: Probable trace right  pleural effusion versus pleural thickening. There are bibasilar linear atelectasis/scarring. Scattered ground-glass nodular densities predominantly involving the upper lobes similar to the prior CT suspicious for an infectious process. Clinical correlation and follow-up to resolution after treatment recommended. No pneumothorax. The central airways are patent. Upper Abdomen: Left adrenal thickening/hyperplasia. Colonic diverticulosis. Musculoskeletal: Osteopenia with degenerative changes of the spine. No acute osseous pathology. Lower cervical ACDF. IMPRESSION: 1. Scattered ground-glass nodular densities predominantly involving the upper lobes similar to the prior CT suspicious for an infectious process. Clinical correlation and follow-up to resolution after treatment recommended. 2. Aortic Atherosclerosis (ICD10-I70.0). Electronically Signed   By: Anner Crete M.D.   On: 07/29/2021 19:45        Scheduled Meds:  acetaminophen-codeine  1 tablet Oral Once   aspirin EC  81 mg Oral Daily   Barium Sulfate  450 mL Oral BID   guaiFENesin  600 mg Oral BID   heparin  5,000 Units Subcutaneous Q8H   HYDROcodone-acetaminophen  1 tablet Oral Once   insulin aspart  0-5 Units Subcutaneous QHS   insulin aspart  0-9 Units Subcutaneous TID WC   metoprolol tartrate  12.5 mg Oral BID   simvastatin  10 mg Oral QHS   sodium bicarbonate  650 mg Oral Daily   tamsulosin  0.4 mg Oral Daily   Continuous Infusions:  cefTRIAXone (ROCEPHIN)  IV 1 g (07/29/21 1736)   methocarbamol (ROBAXIN) IV       LOS: 3 days    Time spent:25 mins. More than 50% of that time was spent in counseling and/or coordination of care.      Shelly Coss, MD [facility] P11/19/2022, 7:29 AM

## 2021-07-30 NOTE — Plan of Care (Signed)

## 2021-07-31 LAB — GLUCOSE, CAPILLARY
Glucose-Capillary: 129 mg/dL — ABNORMAL HIGH (ref 70–99)
Glucose-Capillary: 121 mg/dL — ABNORMAL HIGH (ref 70–99)
Glucose-Capillary: 123 mg/dL — ABNORMAL HIGH (ref 70–99)
Glucose-Capillary: 124 mg/dL — ABNORMAL HIGH (ref 70–99)

## 2021-07-31 LAB — RENAL FUNCTION PANEL
Albumin: 3.3 g/dL — ABNORMAL LOW (ref 3.5–5.0)
Anion gap: 8 (ref 5–15)
BUN: 73 mg/dL — ABNORMAL HIGH (ref 8–23)
CO2: 31 mmol/L (ref 22–32)
Calcium: 8.8 mg/dL — ABNORMAL LOW (ref 8.9–10.3)
Chloride: 99 mmol/L (ref 98–111)
Creatinine, Ser: 3.29 mg/dL — ABNORMAL HIGH (ref 0.44–1.00)
GFR, Estimated: 14 mL/min — ABNORMAL LOW (ref >60)
Glucose, Bld: 151 mg/dL — ABNORMAL HIGH (ref 70–99)
Phosphorus: 5 mg/dL — ABNORMAL HIGH (ref 2.5–4.6)
Potassium: 4 mmol/L (ref 3.5–5.1)
Sodium: 138 mmol/L (ref 135–145)

## 2021-07-31 MED ORDER — BISACODYL 10 MG RE SUPP
10.0000 mg | Freq: Once | RECTAL | Status: AC
  Administered 2021-07-31: 10 mg via RECTAL
  Filled 2021-07-31: qty 1

## 2021-07-31 MED ORDER — POLYETHYLENE GLYCOL 3350 17 G PO PACK
17.0000 g | PACK | Freq: Every day | ORAL | Status: DC
  Administered 2021-07-31: 17 g via ORAL
  Filled 2021-07-31 (×2): qty 1

## 2021-07-31 MED ORDER — SENNA 8.6 MG PO TABS
1.0000 | ORAL_TABLET | Freq: Two times a day (BID) | ORAL | Status: DC
  Administered 2021-07-31 – 2021-08-01 (×3): 8.6 mg via ORAL
  Filled 2021-07-31 (×4): qty 1

## 2021-07-31 NOTE — Progress Notes (Signed)
[facility] Progress Note  Subjective: UOP 1.8 L yest and creat down 3.2 today. Getting IV lasix 40 bid  Vitals:   07/29/21 2130 07/29/21 2145 07/29/21 2159 07/30/21 0531  BP:    (!) 123/59  Pulse:   88 62  Resp: 13 15  18   Temp:    97.9 F (36.6 C)  TempSrc:    Oral  SpO2:    99%  Weight:    95.2 kg  Height:        Exam: General: WDWN NAD Lungs: Decreased breath sounds bilaterally. Breathing is unlabored. Heart: RRR. No murmur, rubs or gallops, no JVD.  Abdomen: soft, nontender, nondistended, +BS, M/S:  Equal strength b/l in upper and lower extremities.  Lower extremities: 1+ pretibial edema in LLE, 2+ pretibial and hip edema in RLE.   UA 11/15 - 100 prot, rare bact, 0-5 rbc, 6-10 wbc   UNa 102, UCr 33 (on lasix)   CT abd 11/17 - Urinary Tract: Mild renal cortical atrophy as seen previously. 2 mm nonobstructing stone in the lower pole of the left kidney. No hydronephrosis or passing stone. No stone in the bladder.  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 Creat 2.2 at baseline on admission when pt came in w/ SOB and hypoxemia. UA unremarkable, no obstruction by CT abdomen. Had initial good UOP response w/ IV lasix 80 mg bid, but creat ^'d to 2.9 and then 3.8.  Held diuretics for 1 day then resumed at lower dose 40mg  IV bid yesterday. Seen by CCM who felt most likely this is vol overload/pulm edema. CT w/o other findings. Diuresing well still and creat down slightly today. Feeling better, breathing better, O2 support weaning down. Per pmd if continues to improve can be dc'd to rehab / SNF in 1-2 days.  Acute respiratory failure with hypoxia- vasc congestion on initial CXR.  BNP 378.9 on 11/15. Continue O2 [place]. As above.  Volume overload- +leg edema, hypoxia presumed due to [place] water. Pt had been pushing fluids at home.  Hypertension- BP were soft here so pt's hydralazine and losartan were stopped and metoprolol (afib) was lowered to 12.5 bid. BP's  better now, 130- 140 range SBP.  Hx of PAF- bradycardia during encounter. Hold parameters on metoprolol. 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  Hx of CVA- some right sided residual weakness, uses a cane. Continue Aspirin. Hyperlipidemia- Continue Simvastatin     Rob Briante Loveall 07/31/2021, 7:59 AM

## 2021-07-31 NOTE — Progress Notes (Signed)
PROGRESS NOTE    Bianca White  BHA:[id number] DOB: 01-09-1946 DOA: 07/25/2021 PCP: Michell Heinrich, DO   Chief Complain:Right flank pain  Brief Narrative: Bianca White is a 75 y.o. female with medical history significant of coronary artery disease status post stent placement to mid RCA, paroxysmal A. fib not on anticoagulation due to history of spinal cord bleed, CKD stage IV, history of CVA with residual right-sided weakness, intermittent bradycardia while on beta-blocker, insulin-dependent diabetes type 2, hypertension, hyperlipidemia who presented to the emergency department with complaint of back pain and pleuritic chest pain.  She was complaining of left lower back/flank pain and complained about radiation to the left abdominal area.  She also reported shortness of breath along with her left flank pain. Chest x-ray did not show any pneumonia.  CT chest without contrast showed few scattered small vague groundglass nodular density in both upper lobes consistent with small airway disease/alveolitis, cardiomegaly. She was found to have mildly  elevated D-dimer.  CT chest for PE could not be done due to CKD status.  VQ scan was obtained which did not show any ventilation/perfusion mismatch.  Patient was deferred admission by our team but as per ED physician, she persistently desaturated on room air requiring 2 L of oxygen.  Patient was admitted for further evaluation and management for hypoxia.PCCM ,nephrology were following here.  Assessment & Plan:   Principal Problem:   Acute respiratory failure with hypoxia (HCC) Active Problems:   Essential hypertension, benign   Hyperlipidemia   History of CVA (cerebrovascular accident)   Paroxysmal atrial fibrillation (HCC)   CAD (coronary artery disease), native coronary artery  Acute respiratory failure with hypoxia:   Does not use oxygen at home.  She was complain of shortness of breath on minimal exertion.  Chest x-ray done on 07/25/2021 did  not show any pneumonia or pulmonary edema.  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 VQ scan did not suggest PE.  Venous Doppler did not show any DVT. We empirically treated for community-acquired pneumonia, though suspicion for pneumonia is low.  Finished antibiotics course.   Patient looked volume overloaded.  BNP elevated,started on IV Lasix - held because creatinine creeped up-now resumed again at 40 mg iv bid repeat chest x-ray did not show any acute findings.  Echo shows EF of 60 to 65%, indeterminate left ventricular diastolic parameters, Mildly elevated pulmonary artery systolic pressure. CT chest without contrast on 11/18 showed scattered ground-glass nodular densities predominantly involving the upper lobes similar to the prior CT suspicious for an infectious process.  Respiratory viral panel did not show anything.  Pulmonary hypertension is a possibility, will recommend sleep study as an outpatient for evaluation of OSA, outpatient pulmonary function test.  She needs to follow-up with pulmonology as an outpatient. Today she is on 1 L/min.   Left flank pain: CT renal study showed small nonobstructing left renal calculus. No evidence of ureteral calculus. Started on tamsulosin.  She complains of persistent left flank pain.  I discussed the case with Dr. Abner Greenspan, urology, he said there is nothing to offer from urology and is unlikely that the small stone is contributing to the pain.  We  repeated CT abdomen/pelvis :  no new finding.  Follow-up CT chest without contrast showed scattered ground-glass nodular densities predominantly involving the upper lobes similar to the prior CT suspicious for an infectious process.  Continue supportive care, pain management  History of hypertension: Takes hydralazine, losartan, metoprolol at home.  Blood pressure soft.  Discontinued hydralazine, losartan.   Decreased the dose of metoprolol  AKI on CKD stage IV:   Baseline creatinine around 2.5.  Creeped up to the range of 3 and now slowly improving.Continue sodium bicarb tablets that she takes at home.  Monitor kidney function.She is s/p right radial aVF created 11/17/2020.  She follows with [facility].  Nephrology consulted and following   Diabetes type 2: On Tresiba at home.  Monitor blood sugars.  Continue sliding scale insulin here.   History of paroxysmal A. fib:  On metoprolol for rate control.  Does not take anticoagulant due to history of spinal cord bleeding   History of nonhemorrhagic stroke: Has residual weakness on the right side.  Takes aspirin at home.   History of hyperlipidemia: Takes simvastatin  Obesity: BMI of 39  Constipation: Continue bowel regimen  Debility/deconditioning: Patient seen by PT/OT and recommended skilled nursing facility on discharge.  Patient and family are agreeable          DVT prophylaxis:Heparin [place] Code Status: Full Family Communication: Husband at bedside on 07/31/21 Status is: Inpatient     Consultants: Nephrology   Procedures:None  Antimicrobials:  Anti-infectives (From admission, onward)    Start     Dose/Rate Route Frequency Ordered Stop   07/26/21 1700  azithromycin (ZITHROMAX) tablet 500 mg  Status:  Discontinued        500 mg Oral Daily 07/26/21 1653 07/29/21 0734   07/26/21 1700  cefTRIAXone (ROCEPHIN) 1 g in sodium chloride 0.9 % 100 mL IVPB        1 g 200 mL/hr over 30 Minutes Intravenous Every 24 hours 07/26/21 1653 07/30/21 1714       Subjective:  Patient seen and examined at the bedside this morning.  Hemodynamically stable.  Sitting in the chair.  She was having constipation so bowel regimen ordered.  She looks comfortable today.  On monitor of oxygen per minute.  She still complains of left flank pain when she moves but she appears comfortable  Objective: Vitals:   07/30/21 2041 07/31/21 0215 07/31/21  0300 07/31/21 0622  BP: (!) 116/52   (!) 81/57  Pulse: (!) 106   65  Resp: 18   19  Temp: 98.3 F (36.8 C)   98 F (36.7 C)  TempSrc: Oral   Oral  SpO2:  (!) 87% 98% 99%  Weight:    93.3 kg  Height:        Intake/Output Summary (Last 24 hours) at 07/31/2021 0809 Last data filed at 07/31/2021 0600 Gross per 24 hour  Intake 460 ml  Output 1850 ml  Net -1390 ml    Filed Weights   07/29/21 0504 07/30/21 0531 07/31/21 0622  Weight: 95.7 kg 95.2 kg 93.3 kg    Examination:   General exam: Overall comfortable, not in distress, obese, pleasant elderly female HEENT: PERRL Respiratory system: Diminished air entry bilaterally on bases, no wheezes or crackles  Cardiovascular system: S1 & S2 heard, RRR.  Gastrointestinal system: Abdomen is nondistended, soft and nontender. Central nervous system: Alert and oriented Extremities: trace bilateral lower extremity edema, no clubbing ,no cyanosis Skin: No rashes, no ulcers,no icterus      Data Reviewed: I have personally reviewed following labs and imaging studies  CBC: Recent Labs  Lab 07/25/21 1802 07/27/21 0408 07/29/21 1200 07/30/21 0346  WBC 5.9 7.0 4.6 4.4  NEUTROABS  --   --   --  2.7  HGB 12.0 10.6* 9.5* 9.5*  HCT 38.3 35.6* 31.6* 31.0*  MCV 92.3 94.9 96.0 93.4  PLT 195 157 162 007*   Basic Metabolic Panel: Recent Labs  Lab 07/27/21 0408 07/28/21 0339 07/29/21 0339 07/30/21 0346 07/31/21 0355  NA 142 140 138 137 138  K 4.5 4.6 4.3 4.4 4.0  CL 109 106 103 100 99  CO2 25 25 29 29 31   GLUCOSE 148* 142* 130* 139* 151*  BUN 52* 55* 63* 67* 73*  CREATININE 2.29* 2.81* 3.73* 3.64* 3.29*  CALCIUM 9.1 9.3 8.8* 8.7* 8.8*  PHOS  --   --   --   --  5.0*   GFR: Estimated Creatinine Clearance: 14.9 mL/min (A) (by C-G formula based on SCr of 3.29 mg/dL (H)). Liver Function Tests: Recent Labs  Lab 07/25/21 1846 07/31/21 0355  AST 16  --   ALT 12  --   ALKPHOS 60  --   BILITOT 1.0  --   PROT 7.1  --   ALBUMIN  3.8 3.3*   No results for input(s): LIPASE, AMYLASE in the last 168 hours. No results for input(s): AMMONIA in the last 168 hours. Coagulation Profile: Recent Labs  Lab 07/25/21 1846  INR 1.1   Cardiac Enzymes: No results for input(s): CKTOTAL, CKMB, CKMBINDEX, TROPONINI in the last 168 hours. BNP (last 3 results) No results for input(s): PROBNP in the last 8760 hours. HbA1C: No results for input(s): HGBA1C in the last 72 hours.  CBG: Recent Labs  Lab 07/30/21 0802 07/30/21 1126 07/30/21 1630 07/30/21 2033 07/31/21 0738  GLUCAP 125* 171* 115* 184* 129*   Lipid Profile: No results for input(s): CHOL, HDL, LDLCALC, TRIG, CHOLHDL, LDLDIRECT in the last 72 hours. Thyroid Function Tests: No results for input(s): TSH, T4TOTAL, FREET4, T3FREE, THYROIDAB in the last 72 hours. Anemia Panel: No results for input(s): VITAMINB12, FOLATE, FERRITIN, TIBC, IRON, RETICCTPCT in the last 72 hours. Sepsis Labs: No results for input(s): PROCALCITON, LATICACIDVEN in the last 168 hours.  Recent Results (from the past 240 hour(s))  Resp Panel by RT-PCR (Flu A&B, Covid) Nasopharyngeal Swab     Status: None   Collection Time: 07/26/21  1:00 PM   Specimen: Nasopharyngeal Swab; Nasopharyngeal(NP) swabs in vial transport medium  Result Value Ref Range Status   SARS Coronavirus 2 by RT PCR NEGATIVE NEGATIVE Final    Comment: (NOTE) SARS-CoV-2 target nucleic acids are NOT DETECTED.  The SARS-CoV-2 RNA is generally detectable in upper respiratory specimens during the acute phase of infection. The lowest concentration of SARS-CoV-2 viral copies this assay can detect is 138 copies/mL. A negative result does not preclude SARS-Cov-2 infection and should not be used as the sole basis for treatment or other patient management decisions. A negative result may occur with  improper specimen collection/handling, submission of specimen other than nasopharyngeal swab, presence of viral mutation(s) within  the areas targeted by this assay, and inadequate number of viral copies(<138 copies/mL). A negative result must be combined with clinical observations, patient history, and epidemiological information. The expected result is Negative.  Fact Sheet for Patients:  EntrepreneurPulse.com.au  Fact Sheet for Healthcare Providers:  IncredibleEmployment.be  This test is no t yet approved or cleared by the [place] FDA and  has been authorized for detection and/or diagnosis of SARS-CoV-2 by FDA under an Emergency Use Authorization (EUA). This EUA will remain  in effect (meaning this test can be used) for the duration of the COVID-19 declaration under Section 564(b)(1) of  the Act, 21 U.S.C.section 360bbb-3(b)(1), unless the authorization is terminated  or revoked sooner.       Influenza A by PCR NEGATIVE NEGATIVE Final   Influenza B by PCR NEGATIVE NEGATIVE Final    Comment: (NOTE) The Xpert Xpress SARS-CoV-2/FLU/RSV plus assay is intended as an aid in the diagnosis of influenza from Nasopharyngeal swab specimens and should not be used as a sole basis for treatment. Nasal washings and aspirates are unacceptable for Xpert Xpress SARS-CoV-2/FLU/RSV testing.  Fact Sheet for Patients: EntrepreneurPulse.com.au  Fact Sheet for Healthcare Providers: IncredibleEmployment.be  This test is not yet approved or cleared by the [place] FDA and has been authorized for detection and/or diagnosis of SARS-CoV-2 by FDA under an Emergency Use Authorization (EUA). This EUA will remain in effect (meaning this test can be used) for the duration of the COVID-19 declaration under Section 564(b)(1) of the Act, 21 U.S.C. section 360bbb-3(b)(1), unless the authorization is terminated or revoked.  Performed at [facility], [place] [address] [place], [place] [address]   Respiratory (~20 pathogens) panel by PCR      Status: None   Collection Time: 07/29/21  1:18 PM   Specimen: Nasopharyngeal Swab; Respiratory  Result Value Ref Range Status   Adenovirus NOT DETECTED NOT DETECTED Final   Coronavirus 229E NOT DETECTED NOT DETECTED Final    Comment: (NOTE) The Coronavirus on the Respiratory Panel, DOES NOT test for the novel  Coronavirus (2019 nCoV)    Coronavirus HKU1 NOT DETECTED NOT DETECTED Final   Coronavirus NL63 NOT DETECTED NOT DETECTED Final   Coronavirus OC43 NOT DETECTED NOT DETECTED Final   Metapneumovirus NOT DETECTED NOT DETECTED Final   Rhinovirus / Enterovirus NOT DETECTED NOT DETECTED Final   Influenza A NOT DETECTED NOT DETECTED Final   Influenza B NOT DETECTED NOT DETECTED Final   Parainfluenza Virus 1 NOT DETECTED NOT DETECTED Final   Parainfluenza Virus 2 NOT DETECTED NOT DETECTED Final   Parainfluenza Virus 3 NOT DETECTED NOT DETECTED Final   Parainfluenza Virus 4 NOT DETECTED NOT DETECTED Final   Respiratory Syncytial Virus NOT DETECTED NOT DETECTED Final   Bordetella pertussis NOT DETECTED NOT DETECTED Final   Bordetella Parapertussis NOT DETECTED NOT DETECTED Final   Chlamydophila pneumoniae NOT DETECTED NOT DETECTED Final   Mycoplasma pneumoniae NOT DETECTED NOT DETECTED Final    Comment: Performed at [facility], [place]. [address] [place], [place] [address]  Expectorated Sputum Assessment w Gram Stain, Rflx to Resp Cult     Status: None   Collection Time: 07/30/21  4:52 PM   Specimen: Sputum  Result Value Ref Range Status   Specimen Description SPUTUM  Final   Special Requests NONE  Final   Sputum evaluation   Final    Sputum specimen not acceptable for testing.  Please recollect.   CALLED OIGHTSEY, S ON 07/30/2021 AT 1601 Performed at [facility], [place] [address], [place], [place] [address]    Report Status 07/30/2021 FINAL  Final         Radiology Studies: CT CHEST WO CONTRAST  Result Date: 07/29/2021 CLINICAL DATA:  Chest pain  and shortness of breath. EXAM: CT CHEST WITHOUT CONTRAST TECHNIQUE: Multidetector CT imaging of the chest was performed following the standard protocol without IV contrast. COMPARISON:  Chest CT dated 07/25/2021. FINDINGS: Evaluation of this exam is limited in the absence of intravenous contrast. Cardiovascular: Mild cardiomegaly. No pericardial effusion. Three-vessel coronary vascular calcification. Moderate atherosclerotic calcification of the  thoracic aorta. No aneurysmal dilatation. There is mild dilatation of the main pulmonary trunk suggestive of pulmonary hypertension. Clinical correlation is recommended. Mediastinum/Nodes: No hilar or mediastinal adenopathy. The esophagus and the thyroid gland are grossly unremarkable. No mediastinal fluid collection. Lungs/Pleura: Probable trace right pleural effusion versus pleural thickening. There are bibasilar linear atelectasis/scarring. Scattered ground-glass nodular densities predominantly involving the upper lobes similar to the prior CT suspicious for an infectious process. Clinical correlation and follow-up to resolution after treatment recommended. No pneumothorax. The central airways are patent. Upper Abdomen: Left adrenal thickening/hyperplasia. Colonic diverticulosis. Musculoskeletal: Osteopenia with degenerative changes of the spine. No acute osseous pathology. Lower cervical ACDF. IMPRESSION: 1. Scattered ground-glass nodular densities predominantly involving the upper lobes similar to the prior CT suspicious for an infectious process. Clinical correlation and follow-up to resolution after treatment recommended. 2. Aortic Atherosclerosis (ICD10-I70.0). Electronically Signed   By: Anner Crete M.D.   On: 07/29/2021 19:45        Scheduled Meds:  acetaminophen-codeine  1 tablet Oral Once   aspirin EC  81 mg Oral Daily   Barium Sulfate  450 mL Oral BID   bisacodyl  10 mg Rectal Once   furosemide  40 mg Intravenous BID   guaiFENesin  600 mg Oral  BID   heparin  5,000 Units Subcutaneous Q8H   HYDROcodone-acetaminophen  1 tablet Oral Once   insulin aspart  0-5 Units Subcutaneous QHS   insulin aspart  0-9 Units Subcutaneous TID WC   metoprolol tartrate  12.5 mg Oral BID   polyethylene glycol  17 g Oral Daily   senna  1 tablet Oral BID   simvastatin  10 mg Oral QHS   sodium bicarbonate  650 mg Oral Daily   tamsulosin  0.4 mg Oral Daily   Continuous Infusions:  methocarbamol (ROBAXIN) IV       LOS: 4 days    Time spent:25 mins. More than 50% of that time was spent in counseling and/or coordination of care.      Shelly Coss, MD [facility] P11/20/2022, 8:09 AM

## 2021-07-31 NOTE — Plan of Care (Signed)
  Problem: Education: Goal: Knowledge of General Education information will improve Description: Including pain rating scale, medication(s)/side effects and non-pharmacologic comfort measures Outcome: Progressing   Problem: Clinical Measurements: Goal: Ability to maintain clinical measurements within normal limits will improve Outcome: Progressing Goal: Diagnostic test results will improve Outcome: Progressing Goal: Respiratory complications will improve Outcome: Progressing   Problem: Activity: Goal: Risk for activity intolerance will decrease Outcome: Progressing   

## 2021-08-01 LAB — RENAL FUNCTION PANEL
Albumin: 3.4 g/dL — ABNORMAL LOW (ref 3.5–5.0)
Anion gap: 8 (ref 5–15)
BUN: 66 mg/dL — ABNORMAL HIGH (ref 8–23)
CO2: 31 mmol/L (ref 22–32)
Calcium: 9.1 mg/dL (ref 8.9–10.3)
Chloride: 98 mmol/L (ref 98–111)
Creatinine, Ser: 3.28 mg/dL — ABNORMAL HIGH (ref 0.44–1.00)
GFR, Estimated: 14 mL/min — ABNORMAL LOW (ref >60)
Glucose, Bld: 128 mg/dL — ABNORMAL HIGH (ref 70–99)
Phosphorus: 4.5 mg/dL (ref 2.5–4.6)
Potassium: 4.3 mmol/L (ref 3.5–5.1)
Sodium: 137 mmol/L (ref 135–145)

## 2021-08-01 LAB — GLUCOSE, CAPILLARY
Glucose-Capillary: 123 mg/dL — ABNORMAL HIGH (ref 70–99)
Glucose-Capillary: 189 mg/dL — ABNORMAL HIGH (ref 70–99)

## 2021-08-01 NOTE — Plan of Care (Signed)
  Problem: Activity: Goal: Risk for activity intolerance will decrease Outcome: Progressing   Problem: Elimination: Goal: Will not experience complications related to bowel motility Outcome: Progressing   Problem: Pain Managment: Goal: General experience of comfort will improve Outcome: Progressing   

## 2021-08-01 NOTE — Progress Notes (Signed)
Patient oxygen sats dropped to 87% on RA at rest. Patient placed on 3 L [place], sats 94%. Oxygen now weaned to 2 L [place], sats 99%.

## 2021-08-01 NOTE — Progress Notes (Signed)
SATURATION QUALIFICATIONS: (This note is used to comply with regulatory documentation for home oxygen)  Patient Saturations on Room Air at Rest = 97%  Patient Saturations on Room Air while Ambulating = 95%  Patient Saturations on 0 Liters of oxygen while Ambulating = 95%  Please briefly explain why patient needs home oxygen: no need

## 2021-08-01 NOTE — Plan of Care (Signed)

## 2021-08-01 NOTE — Progress Notes (Signed)
Physical Therapy Treatment Patient Details Name: Bianca White MRN: [id number] DOB: 03-28-1946 Today's Date: 08/01/2021   History of Present Illness Bianca White is a 75 y.o. female who presented to the emergency department with complaint of back pain and pleuritic chest pain on 08/01/21. Negative for PE, CT -ground glass opacities.  Pt with L flank pain and found to have small nonobstructing left renal calculus. Pt with medical history significant of coronary artery disease status post stent placement to mid RCA, paroxysmal A. fib not on anticoagulation due to history of spinal cord bleed, CKD stage IV, history of CVA with residual right-sided weakness, intermittent bradycardia while on beta-blocker, insulin-dependent diabetes type 2, hypertension, hyperlipidemia    PT Comments    Pt making excellent progress.  She was able to transfer and ambulate 180' with RW and close supervision. Requiring min cues for RW use.  She was on RA with sats >95% throughout.  She does still have L flank pain that is limiting factor.  Will continue to progress with PT.  Did update d/c recommendations from SNF to HHPT due to good progress.     Recommendations for follow up therapy are one component of a multi-disciplinary discharge planning process, led by the attending physician.  Recommendations may be updated based on patient status, additional functional criteria and insurance authorization.  Follow Up Recommendations  Home health PT     Assistance Recommended at Discharge Intermittent Supervision/Assistance  Equipment Recommendations  None recommended by PT    Recommendations for Other Services       Precautions / Restrictions Precautions Precautions: Fall     Mobility  Bed Mobility               General bed mobility comments: In recliner upon arrival    Transfers Overall transfer level: Needs assistance Equipment used: Rolling walker (2 wheels) Transfers: Sit to/from Stand Sit  to Stand: Supervision                Ambulation/Gait Ambulation/Gait assistance: Supervision Gait Distance (Feet): 180 Feet Assistive device: Rolling walker (2 wheels) Gait Pattern/deviations: Step-through pattern;Decreased stride length Gait velocity: decreased     General Gait Details: close supervision; min cues for RW use; 1 standing rest break   Stairs             Wheelchair Mobility    Modified Rankin (Stroke Patients Only)       Balance Overall balance assessment: Needs assistance Sitting-balance support: Feet supported;No upper extremity supported Sitting balance-Leahy Scale: Normal     Standing balance support: Bilateral upper extremity supported;No upper extremity supported Standing balance-Leahy Scale: Fair Standing balance comment: Able to maintain static standing                            Cognition Arousal/Alertness: Awake/alert Behavior During Therapy: WFL for tasks assessed/performed Overall Cognitive Status: Within Functional Limits for tasks assessed                                          Exercises      General Comments General comments (skin integrity, edema, etc.): Pt on RA with sats 95% rest and 96% activity.      Pertinent Vitals/Pain Pain Assessment: Faces Faces Pain Scale: Hurts whole lot Pain Location: under left lower ribs when moving; no pain rest Pain Descriptors /  Indicators: Grimacing;Guarding;Pressure;Sharp Pain Intervention(s): Limited activity within patient's tolerance;Monitored during session    Home Living                          Prior Function            PT Goals (current goals can now be found in the care plan section) Acute Rehab PT Goals Potential to Achieve Goals: Good Progress towards PT goals: Progressing toward goals    Frequency    Min 3X/week      PT Plan Discharge plan needs to be updated;Frequency needs to be updated    Co-evaluation               AM-PAC PT "6 Clicks" Mobility   Outcome Measure  Help needed turning from your back to your side while in a flat bed without using bedrails?: A Little Help needed moving from lying on your back to sitting on the side of a flat bed without using bedrails?: A Little Help needed moving to and from a bed to a chair (including a wheelchair)?: A Little Help needed standing up from a chair using your arms (e.g., wheelchair or bedside chair)?: A Little Help needed to walk in hospital room?: A Little Help needed climbing 3-5 steps with a railing? : A Little 6 Click Score: 18    End of Session Equipment Utilized During Treatment: Gait belt Activity Tolerance: Patient tolerated treatment well Patient left: with call bell/phone within reach;in chair;with family/visitor present Nurse Communication: Mobility status PT Visit Diagnosis: Unsteadiness on feet (R26.81);Difficulty in walking, not elsewhere classified (R26.2)     Time: 7893-8101 PT Time Calculation (min) (ACUTE ONLY): 18 min  Charges:  $Gait Training: 8-22 mins                     Abran Richard, PT Acute Rehab Services Pager [phone] [facility] [place] 08/01/2021, 12:34 PM

## 2021-08-01 NOTE — Progress Notes (Signed)
[facility] Progress Note  Subjective: UOP 1.1 L yest and creat stable 3.2 today-  BUN down from 73 to 66. Getting IV lasix 40 bid-  weight down as well-  she is off of O2-  sitting in bedside chair but still some back/belly pain  Vitals:   07/29/21 2130 07/29/21 2145 07/29/21 2159 07/30/21 0531  BP:    (!) 123/59  Pulse:   88 62  Resp: 13 15  18   Temp:    97.9 F (36.6 C)  TempSrc:    Oral  SpO2:    99%  Weight:    95.2 kg  Height:        Exam: General: WDWN NAD-  slightly slow mentally it seems  Lungs: Decreased breath sounds bilaterally. Breathing is unlabored. Heart: RRR. No murmur, rubs or gallops, no JVD.  Abdomen: soft, nontender, nondistended, +BS, M/S:  Equal strength b/l in upper and lower extremities.  Lower extremities: 1+ pretibial edema in LLE, 2+ pretibial and hip edema in RLE.   UA 11/15 - 100 prot, rare bact, 0-5 rbc, 6-10 wbc   UNa 102, UCr 33 (on lasix)   CT abd 11/17 - Urinary Tract: Mild renal cortical atrophy as seen previously. 2 mm nonobstructing stone in the lower pole of the left kidney. No hydronephrosis or passing stone. No stone in the bladder.  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 Creat 2.2 at baseline on admission when pt came in w/ SOB and hypoxemia. UA unremarkable, no obstruction by CT abdomen. Had initial good UOP response w/ IV lasix 80 mg bid, but creat ^'d to 2.9 and then 3.8.  Held diuretics for 1 day then resumed at lower dose 40mg  IV bid on 11/19. Seen by CCM who felt most likely this is vol overload/pulm edema. CT w/o other findings. Diuresing well still and creat down slightly today. Feeling better, breathing better, O2 support weaning down. Per pmd if continues to improve can be dc'd to rehab / SNF in 1-2 days.  At the time of discharge can change over to lasix PO 40 BID Acute respiratory failure with hypoxia- vasc congestion on initial CXR.  BNP 378.9 on 11/15. Continue O2 [place]. As above.  Volume  overload- +leg edema, hypoxia presumed due to [place] water. Pt had been pushing fluids at home. improved Hypertension- BP were soft here so pt's hydralazine and losartan were stopped and metoprolol (afib) was lowered to 12.5 bid. BP's better now, 130- 140 range SBP.  Hx of PAF- bradycardia during encounter. Hold parameters on metoprolol. 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  Hx of CVA- some right sided residual weakness, uses a cane. Continue Aspirin. Hyperlipidemia- Continue Simvastatin Anemia-  some with hgb in the 9's-  will check tomorrow as well as iron stores    Louis Meckel  07/31/2021, 7:59 AM

## 2021-08-01 NOTE — Progress Notes (Signed)
PROGRESS NOTE    Bianca White  XHB:[id number] DOB: 1946/08/01 DOA: 07/25/2021 PCP: Michell Heinrich, DO   Chief Complain:Right flank pain  Brief Narrative: Bianca White is a 75 y.o. female with medical history significant of coronary artery disease status post stent placement to mid RCA, paroxysmal A. fib not on anticoagulation due to history of spinal cord bleed, CKD stage IV, history of CVA with residual right-sided weakness, intermittent bradycardia while on beta-blocker, insulin-dependent diabetes type 2, hypertension, hyperlipidemia who presented to the emergency department with complaint of back pain and pleuritic chest pain.  She was complaining of left lower back/flank pain and complained about radiation to the left abdominal area.  She also reported shortness of breath along with her left flank pain. Chest x-ray did not show any pneumonia.  CT chest without contrast showed few scattered small vague groundglass nodular density in both upper lobes consistent with small airway disease/alveolitis, cardiomegaly. She was found to have mildly  elevated D-dimer.  CT chest for PE could not be done due to CKD status.  VQ scan was obtained which did not show any ventilation/perfusion mismatch.  Patient was deferred admission by our team but as per ED physician, she persistently desaturated on room air requiring 2 L of oxygen.  Patient was admitted for further evaluation and management for hypoxia.PCCM ,nephrology were following here.  Assessment & Plan:   Principal Problem:   Acute respiratory failure with hypoxia (HCC) Active Problems:   Essential hypertension, benign   Hyperlipidemia   History of CVA (cerebrovascular accident)   Paroxysmal atrial fibrillation (HCC)   CAD (coronary artery disease), native coronary artery  Acute respiratory failure with hypoxia:   Does not use oxygen at home.  She was complain of shortness of breath on minimal exertion.  Chest x-ray done on 07/25/2021 did  not show any pneumonia or pulmonary edema.  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 VQ scan did not suggest PE.  Venous Doppler did not show any DVT. We empirically treated for community-acquired pneumonia, though suspicion for pneumonia is low.  Finished antibiotics course.   Patient looked volume overloaded.  BNP elevated,started on IV Lasix - held because creatinine creeped up-now resumed again at 40 mg iv bid repeat chest x-ray did not show any acute findings.  Echo shows EF of 60 to 65%, indeterminate left ventricular diastolic parameters, Mildly elevated pulmonary artery systolic pressure. CT chest without contrast on 11/18 showed scattered ground-glass nodular densities predominantly involving the upper lobes similar to the prior CT suspicious for an infectious process.  Respiratory viral panel did not show anything.  Pulmonary hypertension is a possibility, will recommend sleep study as an outpatient for evaluation of OSA, outpatient pulmonary function test.  She needs to follow-up with pulmonology as an outpatient. Today she is on 1 L/min.   Left flank pain: CT renal study showed small nonobstructing left renal calculus. No evidence of ureteral calculus. Started on tamsulosin.  She complains of persistent left flank pain.  I discussed the case with Dr. Abner Greenspan, urology, he said there is nothing to offer from urology and is unlikely that the small stone is contributing to the pain.  We  repeated CT abdomen/pelvis :  no new finding.  Follow-up CT chest without contrast showed scattered ground-glass nodular densities predominantly involving the upper lobes similar to the prior CT suspicious for an infectious process.  Continue supportive care, pain management  History of hypertension: Takes hydralazine, losartan, metoprolol at home.  Blood pressure soft.  Discontinued hydralazine, losartan.   Decreased the dose of metoprolol  AKI on CKD stage IV:   Baseline creatinine around 2.5.  Creeped up to the range of 3 and now slowly improving.on IV Lasix.  Continue sodium bicarb tablets that she takes at home.  Monitor kidney function.She is s/p right radial aVF created 11/17/2020.  She follows with [facility].  Nephrology consulted and following  Normocytic anemia: Most likely associated with CKD.  Currently hemoglobin stable.   Diabetes type 2: On Tresiba at home.  Monitor blood sugars.  Continue sliding scale insulin here.   History of paroxysmal A. fib:  On metoprolol for rate control.  Does not take anticoagulant due to history of spinal cord bleeding   History of nonhemorrhagic stroke: Has residual weakness on the right side.  Takes aspirin at home.   History of hyperlipidemia: Takes simvastatin  Obesity: BMI of 39  Constipation: Continue bowel regimen  Debility/deconditioning: Patient seen by PT/OT and recommended skilled nursing facility on discharge.  Patient and family preferring home health.  We have requested for evaluation.          DVT prophylaxis:Heparin [place] Code Status: Full Family Communication: Husband at bedside on 08/01/21 Status is: Inpatient     Consultants: Nephrology   Procedures:None  Antimicrobials:  Anti-infectives (From admission, onward)    Start     Dose/Rate Route Frequency Ordered Stop   07/26/21 1700  azithromycin (ZITHROMAX) tablet 500 mg  Status:  Discontinued        500 mg Oral Daily 07/26/21 1653 07/29/21 0734   07/26/21 1700  cefTRIAXone (ROCEPHIN) 1 g in sodium chloride 0.9 % 100 mL IVPB        1 g 200 mL/hr over 30 Minutes Intravenous Every 24 hours 07/26/21 1653 07/30/21 1714       Subjective:  Patient seen and examined at the bedside this morning.  Hemodynamically stable.  Still looks better but still complains of left flank pain when she moves.  She was on 1 L of oxygen per minute this  morning.  Objective: Vitals:   08/01/21 0530 08/01/21 0555 08/01/21 0559 08/01/21 0659  BP:   113/74   Pulse:   (!) 43   Resp:   20   Temp:   98 F (36.7 C)   TempSrc:   Oral   SpO2:  (!) 87% 94% 99%  Weight: 91.7 kg     Height:        Intake/Output Summary (Last 24 hours) at 08/01/2021 0802 Last data filed at 08/01/2021 0530 Gross per 24 hour  Intake 240 ml  Output 1150 ml  Net -910 ml    Filed Weights   07/30/21 0531 07/31/21 0622 08/01/21 0530  Weight: 95.2 kg 93.3 kg 91.7 kg    Examination:  General exam: Overall comfortable, not in distress,obese HEENT: PERRL Respiratory system: Diminished air sounds on the bases, no wheezes or crackles   Cardiovascular system: S1 & S2 heard, RRR.  Gastrointestinal system: Abdomen is nondistended, soft and nontender. Central nervous system: Alert and oriented Extremities: No edema, no clubbing ,no cyanosis Skin: No rashes, no ulcers,no icterus     Data Reviewed: I have personally reviewed following labs and imaging studies  CBC: Recent Labs  Lab 07/25/21 1802 07/27/21 0408 07/29/21 1200 07/30/21 0346  WBC 5.9 7.0 4.6 4.4  NEUTROABS  --   --   --  2.7  HGB 12.0 10.6* 9.5* 9.5*  HCT 38.3 35.6* 31.6* 31.0*  MCV 92.3 94.9 96.0 93.4  PLT 195 157 162 952*   Basic Metabolic Panel: Recent Labs  Lab 07/28/21 0339 07/29/21 0339 07/30/21 0346 07/31/21 0355 08/01/21 0357  NA 140 138 137 138 137  K 4.6 4.3 4.4 4.0 4.3  CL 106 103 100 99 98  CO2 25 29 29 31 31   GLUCOSE 142* 130* 139* 151* 128*  BUN 55* 63* 67* 73* 66*  CREATININE 2.81* 3.73* 3.64* 3.29* 3.28*  CALCIUM 9.3 8.8* 8.7* 8.8* 9.1  PHOS  --   --   --  5.0* 4.5   GFR: Estimated Creatinine Clearance: 14.8 mL/min (A) (by C-G formula based on SCr of 3.28 mg/dL (H)). Liver Function Tests: Recent Labs  Lab 07/25/21 1846 07/31/21 0355 08/01/21 0357  AST 16  --   --   ALT 12  --   --   ALKPHOS 60  --   --   BILITOT 1.0  --   --   PROT 7.1  --   --    ALBUMIN 3.8 3.3* 3.4*   No results for input(s): LIPASE, AMYLASE in the last 168 hours. No results for input(s): AMMONIA in the last 168 hours. Coagulation Profile: Recent Labs  Lab 07/25/21 1846  INR 1.1   Cardiac Enzymes: No results for input(s): CKTOTAL, CKMB, CKMBINDEX, TROPONINI in the last 168 hours. BNP (last 3 results) No results for input(s): PROBNP in the last 8760 hours. HbA1C: No results for input(s): HGBA1C in the last 72 hours.  CBG: Recent Labs  Lab 07/30/21 2033 07/31/21 0738 07/31/21 1159 07/31/21 1638 07/31/21 2109  GLUCAP 184* 129* 121* 123* 124*   Lipid Profile: No results for input(s): CHOL, HDL, LDLCALC, TRIG, CHOLHDL, LDLDIRECT in the last 72 hours. Thyroid Function Tests: No results for input(s): TSH, T4TOTAL, FREET4, T3FREE, THYROIDAB in the last 72 hours. Anemia Panel: No results for input(s): VITAMINB12, FOLATE, FERRITIN, TIBC, IRON, RETICCTPCT in the last 72 hours. Sepsis Labs: No results for input(s): PROCALCITON, LATICACIDVEN in the last 168 hours.  Recent Results (from the past 240 hour(s))  Resp Panel by RT-PCR (Flu A&B, Covid) Nasopharyngeal Swab     Status: None   Collection Time: 07/26/21  1:00 PM   Specimen: Nasopharyngeal Swab; Nasopharyngeal(NP) swabs in vial transport medium  Result Value Ref Range Status   SARS Coronavirus 2 by RT PCR NEGATIVE NEGATIVE Final    Comment: (NOTE) SARS-CoV-2 target nucleic acids are NOT DETECTED.  The SARS-CoV-2 RNA is generally detectable in upper respiratory specimens during the acute phase of infection. The lowest concentration of SARS-CoV-2 viral copies this assay can detect is 138 copies/mL. A negative result does not preclude SARS-Cov-2 infection and should not be used as the sole basis for treatment or other patient management decisions. A negative result may occur with  improper specimen collection/handling, submission of specimen other than nasopharyngeal swab, presence of viral  mutation(s) within the areas targeted by this assay, and inadequate number of viral copies(<138 copies/mL). A negative result must be combined with clinical observations, patient history, and epidemiological information. The expected result is Negative.  Fact Sheet for Patients:  EntrepreneurPulse.com.au  Fact Sheet for Healthcare Providers:  IncredibleEmployment.be  This test is no t yet approved or cleared by the [place] FDA and  has been authorized for detection and/or diagnosis of SARS-CoV-2 by FDA under an Emergency Use Authorization (EUA). This EUA will remain  in effect (meaning this test  can be used) for the duration of the COVID-19 declaration under Section 564(b)(1) of the Act, 21 U.S.C.section 360bbb-3(b)(1), unless the authorization is terminated  or revoked sooner.       Influenza A by PCR NEGATIVE NEGATIVE Final   Influenza B by PCR NEGATIVE NEGATIVE Final    Comment: (NOTE) The Xpert Xpress SARS-CoV-2/FLU/RSV plus assay is intended as an aid in the diagnosis of influenza from Nasopharyngeal swab specimens and should not be used as a sole basis for treatment. Nasal washings and aspirates are unacceptable for Xpert Xpress SARS-CoV-2/FLU/RSV testing.  Fact Sheet for Patients: EntrepreneurPulse.com.au  Fact Sheet for Healthcare Providers: IncredibleEmployment.be  This test is not yet approved or cleared by the [place] FDA and has been authorized for detection and/or diagnosis of SARS-CoV-2 by FDA under an Emergency Use Authorization (EUA). This EUA will remain in effect (meaning this test can be used) for the duration of the COVID-19 declaration under Section 564(b)(1) of the Act, 21 U.S.C. section 360bbb-3(b)(1), unless the authorization is terminated or revoked.  Performed at [facility], [place] [address] [place], [place] [address]   Respiratory (~20  pathogens) panel by PCR     Status: None   Collection Time: 07/29/21  1:18 PM   Specimen: Nasopharyngeal Swab; Respiratory  Result Value Ref Range Status   Adenovirus NOT DETECTED NOT DETECTED Final   Coronavirus 229E NOT DETECTED NOT DETECTED Final    Comment: (NOTE) The Coronavirus on the Respiratory Panel, DOES NOT test for the novel  Coronavirus (2019 nCoV)    Coronavirus HKU1 NOT DETECTED NOT DETECTED Final   Coronavirus NL63 NOT DETECTED NOT DETECTED Final   Coronavirus OC43 NOT DETECTED NOT DETECTED Final   Metapneumovirus NOT DETECTED NOT DETECTED Final   Rhinovirus / Enterovirus NOT DETECTED NOT DETECTED Final   Influenza A NOT DETECTED NOT DETECTED Final   Influenza B NOT DETECTED NOT DETECTED Final   Parainfluenza Virus 1 NOT DETECTED NOT DETECTED Final   Parainfluenza Virus 2 NOT DETECTED NOT DETECTED Final   Parainfluenza Virus 3 NOT DETECTED NOT DETECTED Final   Parainfluenza Virus 4 NOT DETECTED NOT DETECTED Final   Respiratory Syncytial Virus NOT DETECTED NOT DETECTED Final   Bordetella pertussis NOT DETECTED NOT DETECTED Final   Bordetella Parapertussis NOT DETECTED NOT DETECTED Final   Chlamydophila pneumoniae NOT DETECTED NOT DETECTED Final   Mycoplasma pneumoniae NOT DETECTED NOT DETECTED Final    Comment: Performed at [facility], [place]. [address] [place], [place] [address]  Expectorated Sputum Assessment w Gram Stain, Rflx to Resp Cult     Status: None   Collection Time: 07/30/21  4:52 PM   Specimen: Sputum  Result Value Ref Range Status   Specimen Description SPUTUM  Final   Special Requests NONE  Final   Sputum evaluation   Final    Sputum specimen not acceptable for testing.  Please recollect.   CALLED OIGHTSEY, S ON 07/30/2021 AT 1601 Performed at [facility], [place] [address] [place],  [address]    Report Status 07/30/2021 FINAL  Final         Radiology Studies: No results found.      Scheduled Meds:   acetaminophen-codeine  1 tablet Oral Once   aspirin EC  81 mg Oral Daily   Barium Sulfate  450 mL Oral BID   furosemide  40 mg Intravenous BID   guaiFENesin  600 mg Oral BID   heparin  5,000 Units Subcutaneous  Q8H   HYDROcodone-acetaminophen  1 tablet Oral Once   insulin aspart  0-5 Units Subcutaneous QHS   insulin aspart  0-9 Units Subcutaneous TID WC   metoprolol tartrate  12.5 mg Oral BID   polyethylene glycol  17 g Oral Daily   senna  1 tablet Oral BID   simvastatin  10 mg Oral QHS   sodium bicarbonate  650 mg Oral Daily   tamsulosin  0.4 mg Oral Daily   Continuous Infusions:  methocarbamol (ROBAXIN) IV       LOS: 5 days    Time spent:25 mins. More than 50% of that time was spent in counseling and/or coordination of care.      Shelly Coss, MD [facility] P11/21/2022, 8:02 AM

## 2021-08-01 NOTE — TOC Progression Note (Signed)
Transition of Care [facility]) - Progression Note    Patient Details  Name: Bianca White MRN: [id number] Date of Birth: 26-Nov-1945  [facility] CM/SW Contact  Purcell Mouton, RN Phone Number: 08/01/2021, 1:04 PM  Clinical Narrative:    Spoke with pt who asked for [facility]. A call was made to [facility] for [place]. Because of the Holiday, will try to see pt on Wednesday, if not will be a Friday visit. Explained to pt and husband at bedside. Pt states that will be Okay. HHPT orders fax to [phone] [facility].    Expected Discharge Plan: [place] Barriers to Discharge: No Barriers Identified  Expected Discharge Plan and Services Expected Discharge Plan: [place] arrangements for the past 2 months: Single Family Home                                       Social Determinants of Health (SDOH) Interventions    Readmission Risk Interventions No flowsheet data found.

## 2021-08-01 NOTE — Care Management Important Message (Signed)
Important Message  Patient Details IM Letter given to the Patient. Name: Bianca White MRN: [id number] Date of Birth: [birth year]   Medicare Important Message Given:  Yes     Kerin Salen 08/01/2021, 12:34 PM

## 2021-08-02 ENCOUNTER — Encounter (HOSPITAL_COMMUNITY): Admission: RE | Payer: Self-pay | Source: Home / Self Care

## 2021-08-02 ENCOUNTER — Ambulatory Visit (HOSPITAL_COMMUNITY): Admission: RE | Admit: 2021-08-02 | Payer: Medicare Other | Source: Home / Self Care | Admitting: Orthopedic Surgery

## 2021-08-02 DIAGNOSIS — M1611 Unilateral primary osteoarthritis, right hip: Secondary | ICD-10-CM

## 2021-08-02 LAB — RENAL FUNCTION PANEL
Albumin: 3.5 g/dL (ref 3.5–5.0)
Anion gap: 7 (ref 5–15)
BUN: 76 mg/dL — ABNORMAL HIGH (ref 8–23)
CO2: 33 mmol/L — ABNORMAL HIGH (ref 22–32)
Calcium: 9.1 mg/dL (ref 8.9–10.3)
Chloride: 95 mmol/L — ABNORMAL LOW (ref 98–111)
Creatinine, Ser: 3.36 mg/dL — ABNORMAL HIGH (ref 0.44–1.00)
GFR, Estimated: 14 mL/min — ABNORMAL LOW (ref >60)
Glucose, Bld: 143 mg/dL — ABNORMAL HIGH (ref 70–99)
Phosphorus: 4.1 mg/dL (ref 2.5–4.6)
Potassium: 4.1 mmol/L (ref 3.5–5.1)
Sodium: 135 mmol/L (ref 135–145)

## 2021-08-02 LAB — GLUCOSE, CAPILLARY
Glucose-Capillary: 152 mg/dL — ABNORMAL HIGH (ref 70–99)
Glucose-Capillary: 159 mg/dL — ABNORMAL HIGH (ref 70–99)
Glucose-Capillary: 141 mg/dL — ABNORMAL HIGH (ref 70–99)
Glucose-Capillary: 291 mg/dL — ABNORMAL HIGH (ref 70–99)

## 2021-08-02 LAB — CBC
HCT: 32.8 % — ABNORMAL LOW (ref 36.0–46.0)
Hemoglobin: 10.2 g/dL — ABNORMAL LOW (ref 12.0–15.0)
MCH: 28.7 pg (ref 26.0–34.0)
MCHC: 31.1 g/dL (ref 30.0–36.0)
MCV: 92.1 fL (ref 80.0–100.0)
Platelets: 168 10*3/uL (ref 150–400)
RBC: 3.56 MIL/uL — ABNORMAL LOW (ref 3.87–5.11)
RDW: 13.8 % (ref 11.5–15.5)
WBC: 5.7 10*3/uL (ref 4.0–10.5)
nRBC: 0 % (ref 0.0–0.2)

## 2021-08-02 LAB — FERRITIN: Ferritin: 38 ng/mL (ref 11–307)

## 2021-08-02 LAB — IRON AND TIBC
Iron: 39 ug/dL (ref 28–170)
Saturation Ratios: 12 % (ref 10.4–31.8)
TIBC: 331 ug/dL (ref 250–450)
UIBC: 292 ug/dL

## 2021-08-02 SURGERY — ARTHROPLASTY, HIP, TOTAL, ANTERIOR APPROACH
Anesthesia: Spinal | Site: Hip | Laterality: Right

## 2021-08-02 MED ORDER — POLYETHYLENE GLYCOL 3350 17 G PO PACK: 17.0000 g | PACK | Freq: Every day | ORAL | 0 refills | Status: AC

## 2021-08-02 MED ORDER — GUAIFENESIN ER 600 MG PO TB12: 600.0000 mg | ORAL_TABLET | Freq: Two times a day (BID) | ORAL | 0 refills | Status: AC

## 2021-08-02 MED ORDER — FUROSEMIDE 40 MG PO TABS: 40.0000 mg | ORAL_TABLET | Freq: Two times a day (BID) | ORAL | 0 refills | Status: AC

## 2021-08-02 MED ORDER — METHOCARBAMOL 500 MG PO TABS: 500.0000 mg | ORAL_TABLET | Freq: Three times a day (TID) | ORAL | 0 refills | Status: AC | PRN

## 2021-08-02 MED ORDER — METOPROLOL TARTRATE 25 MG PO TABS: 12.5000 mg | ORAL_TABLET | Freq: Two times a day (BID) | ORAL | 0 refills | Status: AC

## 2021-08-02 MED ORDER — SENNA 8.6 MG PO TABS: 1.0000 | ORAL_TABLET | Freq: Two times a day (BID) | ORAL | 0 refills | Status: AC

## 2021-08-02 MED ORDER — OXYCODONE HCL 5 MG PO TABS
5.0000 mg | ORAL_TABLET | Freq: Four times a day (QID) | ORAL | 0 refills | Status: AC | PRN
Start: 2021-08-02 — End: ?

## 2021-08-02 NOTE — Plan of Care (Signed)
  Problem: Education: Goal: Knowledge of General Education information will improve Description Including pain rating scale, medication(s)/side effects and non-pharmacologic comfort measures Outcome: Progressing   Problem: Clinical Measurements: Goal: Diagnostic test results will improve Outcome: Progressing Goal: Respiratory complications will improve Outcome: Progressing   

## 2021-08-02 NOTE — Discharge Summary (Signed)
Physician Discharge Summary  Bianca White NIO:[id number] DOB: July 26, 1946 DOA: 07/25/2021  PCP: Michell Heinrich, DO  Admit date: 07/25/2021 Discharge date: 08/02/2021  Admitted From: Home Disposition:  Home  Discharge Condition:Stable CODE STATUS:FULL Diet recommendation: Heart Healthy   Brief/Interim Summary: Bianca White is a 75 y.o. female with medical history significant of coronary artery disease status post stent placement to mid RCA, paroxysmal A. fib not on anticoagulation due to history of spinal cord bleed, CKD stage IV, history of CVA with residual right-sided weakness, intermittent bradycardia while on beta-blocker, insulin-dependent diabetes type 2, hypertension, hyperlipidemia who presented to the emergency department with complaint of back pain and pleuritic chest pain.  She was complaining of left lower back/flank pain and complained about radiation to the left abdominal area.  She also reported shortness of breath along with her left flank pain. Chest x-ray did not show any pneumonia.  CT chest without contrast showed few scattered small vague groundglass nodular density in both upper lobes consistent with small airway disease/alveolitis, cardiomegaly. She was found to have mildly  elevated D-dimer.  CT chest for PE could not be done due to CKD status.  VQ scan was obtained which did not show any ventilation/perfusion mismatch.  Hospital course was remarkable for persistent left-sided flank pain, persistent requirement of oxygen, worsened kidney function.  Extensive studies were done without finding of clear reason for her left flank pain.  Currently respiratory status is stable, she is on room air.  Nephrology was following him here.  She is medically stable for discharge to home with home health.  She needs to follow-up with her nephrologist as an outpatient.  Following problems were addressed during her hospitalization:   Acute respiratory failure with hypoxia:   Does  not use oxygen at home.  She was complaining of shortness of breath on minimal exertion.  Chest x-ray done on 07/25/2021 did not show any pneumonia or pulmonary edema.  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 VQ scan did not suggest PE.  Venous Doppler did not show any DVT. We empirically treated for community-acquired pneumonia, though suspicion for pneumonia is low.  Finished antibiotics course.   Patient looked volume overloaded.  BNP elevated,started on IV Lasix - held because creatinine creeped up-now resumed again at 40 mg iv bid repeat chest x-ray did not show any acute findings.  Echo shows EF of 60 to 65%, indeterminate left ventricular diastolic parameters, Mildly elevated pulmonary artery systolic pressure. CT chest without contrast on 11/18 showed scattered ground-glass nodular densities predominantly involving the upper lobes similar to the prior CT suspicious for an infectious process.  Respiratory viral panel did not show anything.  Pulmonary hypertension is a possibility, will recommend sleep study as an outpatient for evaluation of OSA, outpatient pulmonary function test.  She needs to follow-up with pulmonology as an outpatient. Today she is on room air   Left flank pain: CT renal study showed small nonobstructing left renal calculus. No evidence of ureteral calculus. Started on tamsulosin.  She complains of persistent left flank pain.  I discussed the case with Dr. Abner Greenspan, urology, he said there is nothing to offer from urology and is unlikely that the small stone is contributing to the pain.  We  repeated CT abdomen/pelvis :  no new finding.  Follow-up CT chest without contrast showed scattered ground-glass nodular densities predominantly involving the upper lobes similar to the prior CT suspicious for  an infectious process.  Continue pain medicines, selections.  Most likely her  pain is secondary to with musculoskeletal etiology because it is associated with movement   History of hypertension: Takes hydralazine, losartan, metoprolol at home.  Blood pressure soft.  Discontinued hydralazine, losartan.  Decreased the dose of metoprolol   AKI on CKD stage IV:   Baseline creatinine around 2.5.  Creeped up to the range of 3 and now slowly improving.She was on IV Lasix. She is s/p right radial aVF created 11/17/2020.  She follows with [facility].  Nephrology were consulted and following here.  We recommend to follow-up with nephrology in 2 weeks   Normocytic anemia: Most likely associated with CKD.  Currently hemoglobin stable.   Diabetes type 2: On Tresiba at home.    History of paroxysmal A. fib:  On metoprolol for rate control.  Does not take anticoagulant due to history of spinal cord bleeding   History of nonhemorrhagic stroke: Has residual weakness on the right side.  Takes aspirin at home.   History of hyperlipidemia: Takes simvastatin   Obesity: BMI of 39   Constipation: Continue bowel regimen   Debility/deconditioning: Patient seen by PT/OT and recommended HH on dc        Discharge Diagnoses:  Principal Problem:   Acute respiratory failure with hypoxia ([place]) Active Problems:   Essential hypertension, benign   Hyperlipidemia   History of CVA (cerebrovascular accident)   Paroxysmal atrial fibrillation (HCC)   CAD (coronary artery disease), native coronary artery    Discharge Instructions  Discharge Instructions     Diet - low sodium heart healthy   Complete by: As directed    Discharge instructions   Complete by: As directed    1)Please take prescribed medications as instructed 2)Follow up with your PCP in a week.  Do a BMP test during the follow-up to check your kidney function 3)Follow up with your nephrologist in 2 weeks 4) we recommend sleep study as an outpatient for evaluation of OSA, outpatient pulmonary function test. Please   follow-up with pulmonology as an outpatient.   Increase activity slowly   Complete by: As directed       Allergies as of 08/02/2021       Reactions   Ciprofloxacin Other (See Comments), Palpitations   Insulin Detemir Swelling, Other (See Comments)   Nsaids    Due to CKD   Stadol [butorphanol]    Went crazy - hallucinations   Sulfa Antibiotics Other (See Comments)   Due to CKD        Medication List     STOP taking these medications    hydrALAZINE 50 MG tablet Commonly known as: APRESOLINE   losartan 50 MG tablet Commonly known as: COZAAR       TAKE these medications    aspirin EC 81 MG tablet Take 81 mg by mouth daily. Swallow whole.   docusate sodium 100 MG capsule Commonly known as: COLACE Take 1 capsule (100 mg total) by mouth 2 (two) times daily. What changed:  when to take this reasons to take this   furosemide 40 MG tablet Commonly known as: Lasix Take 1 tablet (40 mg total) by mouth 2 (two) times daily.   guaiFENesin 600 MG 12 hr tablet Commonly known as: MUCINEX Take 1 tablet (600 mg total) by mouth 2 (two) times daily for 7 days.   methocarbamol 500 MG tablet Commonly known as: Robaxin Take 1 tablet (500 mg total) by mouth every 8 (eight)  hours as needed for up to 7 days for muscle spasms.   metoprolol tartrate 25 MG tablet Commonly known as: LOPRESSOR Take 0.5 tablets (12.5 mg total) by mouth 2 (two) times daily. What changed: how much to take   oxyCODONE 5 MG immediate release tablet Commonly known as: Oxy IR/ROXICODONE Take 1 tablet (5 mg total) by mouth every 6 (six) hours as needed for moderate pain.   polyethylene glycol 17 g packet Commonly known as: MIRALAX / GLYCOLAX Take 17 g by mouth daily. Start taking on: August 03, 2021   senna 8.6 MG Tabs tablet Commonly known as: SENOKOT Take 1 tablet (8.6 mg total) by mouth 2 (two) times daily.   simvastatin 10 MG tablet Commonly known as: ZOCOR Take 10 mg by mouth at  bedtime.   sodium bicarbonate 650 MG tablet Take 650 mg by mouth daily.   Tyler Aas FlexTouch 100 UNIT/ML FlexTouch Pen Generic drug: insulin degludec Inject 30 Units into the skin daily.   VITAMIN D PO Take 4,000 Units by mouth daily.        Follow-up Information     Michell Heinrich, [place]. Schedule an appointment as soon as possible for a visit in 1 week(s).   Specialty: Family Medicine Contact information: [place] [phone]                Allergies  Allergen Reactions   Ciprofloxacin Other (See Comments) and Palpitations   Insulin Detemir Swelling and Other (See Comments)   Nsaids     Due to CKD   Stadol [Butorphanol]     Went crazy - hallucinations   Sulfa Antibiotics Other (See Comments)    Due to CKD    Consultations: Nephrology, PCCM   Procedures/Studies: CT ABDOMEN PELVIS WO CONTRAST  Result Date: 07/28/2021 CLINICAL DATA:  Acute nonlocalized abdominal pain. Persistent unexplained left flank pain. EXAM: CT ABDOMEN AND PELVIS WITHOUT CONTRAST TECHNIQUE: Multidetector CT imaging of the abdomen and pelvis was performed following the standard protocol without IV contrast. COMPARISON:  07/26/2021 FINDINGS: Lower chest: Tiny amount of pleural fluid on the right. Patchy density at the lung bases could be atelectasis or scarring. Cardiomegaly again seen with coronary artery calcification. Hepatobiliary: Liver parenchyma appears normal without contrast. Previous cholecystectomy. Pancreas: Normal Spleen: Normal Adrenals/Urinary Tract: 1.5 cm left adrenal adenoma as seen previously. Mild renal cortical atrophy as seen previously. 2 mm nonobstructing stone in the lower pole of the left kidney. No hydronephrosis or passing stone. No stone in the bladder. Stomach/Bowel: Stomach and small intestine appear normal. Normal appendix. Diverticulosis of the colon without evidence of diverticulitis. Vascular/Lymphatic: Aortic atherosclerosis. No aneurysm.  IVC is normal. No adenopathy. Reproductive: Calcified leiomyomas.  No significant pelvic finding. Other: No free fluid or air. Musculoskeletal: Previous ventral hernia repair. Ordinary chronic degenerative changes of the spine. Previous hip replacement on the left. Osteoarthritis of the right hip. IMPRESSION: No change or acute finding. 2 mm nonobstructing stone in the lower pole of the left kidney. Diverticulosis of the colon without evidence of diverticulitis. Aortic Atherosclerosis (ICD10-I70.0). Electronically Signed   By: Nelson Chimes M.D.   On: 07/28/2021 15:15   DG Ribs Unilateral W/Chest Left  Result Date: 07/25/2021 CLINICAL DATA:  Left posterior rib/flank pain for 3 days EXAM: LEFT RIBS AND CHEST - 3+ VIEW COMPARISON:  Chest radiograph 05/04/2009 FINDINGS: The heart is enlarged. The mediastinal contours are within normal limits. Linear opacities in the left base likely reflect atelectasis or scar. There is no  other focal consolidation. There is no pulmonary edema. There is no pleural effusion or pneumothorax. No displaced rib fracture or other acute osseous abnormality is identified. Cervical spine fusion hardware is noted. IMPRESSION: 1. No displaced rib fracture or other acute osseous abnormality identified. 2. Cardiomegaly. Electronically Signed   By: Valetta Mole M.D.   On: 07/25/2021 14:47   CT CHEST WO CONTRAST  Result Date: 07/29/2021 CLINICAL DATA:  Chest pain and shortness of breath. EXAM: CT CHEST WITHOUT CONTRAST TECHNIQUE: Multidetector CT imaging of the chest was performed following the standard protocol without IV contrast. COMPARISON:  Chest CT dated 07/25/2021. FINDINGS: Evaluation of this exam is limited in the absence of intravenous contrast. Cardiovascular: Mild cardiomegaly. No pericardial effusion. Three-vessel coronary vascular calcification. Moderate atherosclerotic calcification of the thoracic aorta. No aneurysmal dilatation. There is mild dilatation of the main pulmonary  trunk suggestive of pulmonary hypertension. Clinical correlation is recommended. Mediastinum/Nodes: No hilar or mediastinal adenopathy. The esophagus and the thyroid gland are grossly unremarkable. No mediastinal fluid collection. Lungs/Pleura: Probable trace right pleural effusion versus pleural thickening. There are bibasilar linear atelectasis/scarring. Scattered ground-glass nodular densities predominantly involving the upper lobes similar to the prior CT suspicious for an infectious process. Clinical correlation and follow-up to resolution after treatment recommended. No pneumothorax. The central airways are patent. Upper Abdomen: Left adrenal thickening/hyperplasia. Colonic diverticulosis. Musculoskeletal: Osteopenia with degenerative changes of the spine. No acute osseous pathology. Lower cervical ACDF. IMPRESSION: 1. Scattered ground-glass nodular densities predominantly involving the upper lobes similar to the prior CT suspicious for an infectious process. Clinical correlation and follow-up to resolution after treatment recommended. 2. Aortic Atherosclerosis (ICD10-I70.0). Electronically Signed   By: Anner Crete M.D.   On: 07/29/2021 19:45   CT Chest Wo Contrast  Result Date: 07/25/2021 CLINICAL DATA:  Abnormal xray - lung nodule, < 1 cm, mod-high risk EXAM: CT CHEST WITHOUT CONTRAST TECHNIQUE: Multidetector CT imaging of the chest was performed following the standard protocol without IV contrast. COMPARISON:  Rib series today FINDINGS: Cardiovascular: Diffuse coronary artery calcifications and moderate aortic calcifications. Heart is mildly enlarged. No evidence of aortic aneurysm. Mediastinum/Nodes: No mediastinal, hilar, or axillary adenopathy. Trachea and esophagus are unremarkable. Thyroid unremarkable. Lungs/Pleura: Linear atelectasis or scarring at the left lung base. No confluent opacities or effusions. Few scattered ground-glass nodular areas in the left upper lobe/apex and in the right  upper lobe, likely infectious/inflammatory small airways disease/alveolitis. Upper Abdomen: Imaging into the upper abdomen demonstrates no acute findings. Musculoskeletal: Chest wall soft tissues are unremarkable. No acute bony abnormality. 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 Cardiomegaly, coronary artery disease. Aortic Atherosclerosis (ICD10-I70.0). Electronically Signed   By: Rolm Baptise M.D.   On: 07/25/2021 20:15   NM PULMONARY VENT AND PERF (V/Q Scan)  Result Date: 07/26/2021 CLINICAL DATA:  75 year old female with shortness of breath and chest pain. History of blood clot. EXAM: NUCLEAR MEDICINE PERFUSION LUNG SCAN TECHNIQUE: Perfusion images were obtained in multiple projections after intravenous injection of radiopharmaceutical. Ventilation scans intentionally deferred if perfusion scan and chest x-ray adequate for interpretation during COVID 19 epidemic. RADIOPHARMACEUTICALS:  4.2 mCi Tc-12m MAA IV COMPARISON:  Noncontrast Chest CT yesterday. FINDINGS: Fairly homogeneous perfusion radiotracer activity in both lungs. Expected mediastinal photopenia. No convincing perfusion defect. IMPRESSION: Negative, no evidence of pulmonary embolus Electronically Signed   By: Genevie Ann M.D.   On: 07/26/2021 11:41   DG CHEST PORT 1 VIEW  Result Date: 07/26/2021  CLINICAL DATA:  Shortness of breath. EXAM: PORTABLE CHEST 1 VIEW COMPARISON:  Chest x-ray 07/25/2021. FINDINGS: The heart is enlarged. The lungs are clear. There is no pleural effusion or pneumothorax. No acute fractures are seen. Cervical spinal fusion plate is present. IMPRESSION: Stable cardiomegaly.  No active disease. Electronically Signed   By: Ronney Asters M.D.   On: 07/26/2021 17:08   ECHOCARDIOGRAM COMPLETE  Result Date: 07/27/2021    ECHOCARDIOGRAM REPORT   Patient Name:   Bianca White Date of Exam: 07/27/2021 Medical Rec #:  [id number]         Height:       70.0 in Accession #:    [id number]       Weight:       210.0 lb Date of Birth:  09-Jun-1946        BSA:          2.131 m Patient Age:    31 years         BP:           117/42 mmHg Patient Gender: F                HR:           58 bpm. Exam Location:  Inpatient Procedure: 2D Echo, Cardiac Doppler, Color Doppler and Intracardiac            Opacification Agent Indications:    I51.7 Cardiomegaly  History:        Patient has no prior history of Echocardiogram examinations.                 CAD, Stroke; Risk Factors:Hypertension.  Sonographer:    Glo Herring Referring Phys: [id number] [place]leigh Dragoo IMPRESSIONS  1. Left ventricular ejection fraction, by estimation, is 60 to 65%. The left ventricle has normal function. The left ventricle has no regional wall motion abnormalities. There is mild left ventricular hypertrophy. Left ventricular diastolic parameters are indeterminate.  2. Right ventricular systolic function is normal. The right ventricular size is mildly enlarged. There is mildly elevated pulmonary artery systolic pressure. The estimated right ventricular systolic pressure is 82.5 mmHg.  3. Left atrial size was moderately dilated.  4. Right atrial size was severely dilated.  5. The mitral valve is normal in structure. Trivial mitral valve regurgitation. No evidence of mitral stenosis.  6. The aortic valve is tricuspid. Aortic valve regurgitation is not visualized. No aortic stenosis is present.  7. The inferior vena cava is dilated in size with >50% respiratory variability, suggesting right atrial pressure of 8 mmHg.  8. The patient was in atrial fibrillation. FINDINGS  Left Ventricle: Left ventricular ejection fraction, by estimation, is 60 to 65%. The left ventricle has normal function. The left ventricle has no regional wall motion abnormalities. The left ventricular internal cavity size was normal in size. There is  mild left ventricular hypertrophy. Left ventricular diastolic parameters are  indeterminate. Right Ventricle: The right ventricular size is mildly enlarged. No increase in right ventricular wall thickness. Right ventricular systolic function is normal. There is mildly elevated pulmonary artery systolic pressure. The tricuspid regurgitant velocity is 2.93 m/s, and with an assumed right atrial pressure of 8 mmHg, the estimated right ventricular systolic pressure is 05.3 mmHg. Left Atrium: Left atrial size was moderately dilated. Right Atrium: Right atrial size was severely dilated. Pericardium: There is no evidence of pericardial effusion. Mitral Valve: The mitral valve is normal in structure. There is mild calcification of the mitral valve  leaflet(s). Mild mitral annular calcification. Trivial mitral valve regurgitation. No evidence of mitral valve stenosis. Tricuspid Valve: The tricuspid valve is normal in structure. Tricuspid valve regurgitation is mild. Aortic Valve: The aortic valve is tricuspid. Aortic valve regurgitation is not visualized. No aortic stenosis is present. Aortic valve mean gradient measures 4.7 mmHg. Aortic valve peak gradient measures 9.6 mmHg. Aortic valve area, by VTI measures 2.81 cm. Pulmonic Valve: The pulmonic valve was normal in structure. Pulmonic valve regurgitation is trivial. Aorta: The aortic root is normal in size and structure. Venous: The inferior vena cava is dilated in size with greater than 50% respiratory variability, suggesting right atrial pressure of 8 mmHg. IAS/Shunts: No atrial level shunt detected by color flow Doppler.  LEFT VENTRICLE PLAX 2D LVIDd:         4.30 cm   Diastology LVIDs:         2.80 cm   LV e' medial:    6.45 cm/s LV PW:         1.40 cm   LV E/e' medial:  22.7 LV IVS:        1.40 cm   LV e' lateral:   11.27 cm/s LVOT diam:     2.00 cm   LV E/e' lateral: 13.0 LV SV:         92 LV SV Index:   43 LVOT Area:     3.14 cm  RIGHT VENTRICLE             IVC RV Basal diam:  4.40 cm     IVC diam: 2.80 cm RV S prime:     10.03 cm/s LEFT  ATRIUM             Index        RIGHT ATRIUM           Index LA diam:        4.30 cm 2.02 cm/m   RA Area:     29.10 cm LA Vol (A2C):   66.3 ml 31.11 ml/m  RA Volume:   106.00 ml 49.74 ml/m LA Vol (A4C):   85.8 ml 40.26 ml/m LA Biplane Vol: 81.4 ml 38.20 ml/m  AORTIC VALVE                     PULMONIC VALVE AV Area (Vmax):    2.62 cm      PV Vmax:       1.38 m/s AV Area (Vmean):   2.53 cm      PV Peak grad:  7.6 mmHg AV Area (VTI):     2.81 cm AV Vmax:           155.00 cm/s AV Vmean:          103.500 cm/s AV VTI:            0.327 m AV Peak Grad:      9.6 mmHg AV Mean Grad:      4.7 mmHg LVOT Vmax:         129.33 cm/s LVOT Vmean:        83.200 cm/s LVOT VTI:          0.293 m LVOT/AV VTI ratio: 0.90  AORTA Ao Root diam: 3.30 cm Ao Asc diam:  3.40 cm MITRAL VALVE                TRICUSPID VALVE MV Area (PHT): 3.43 cm     TR Peak grad:   34.3 mmHg  MV Decel Time: 221 msec     TR Vmax:        293.00 cm/s MV E velocity: 146.67 cm/s                             SHUNTS                             Systemic VTI:  0.29 m                             Systemic Diam: 2.00 cm Dalton McleanMD Electronically signed by Franki Monte Signature Date/Time: 07/27/2021/4:51:25 PM    Final    CT Renal Stone Study  Result Date: 07/26/2021 CLINICAL DATA:  Flank and back pain.  Kidney stone suspected. EXAM: CT ABDOMEN AND PELVIS WITHOUT CONTRAST TECHNIQUE: Multidetector CT imaging of the abdomen and pelvis was performed following the standard protocol without IV contrast. COMPARISON:  Abdominopelvic CT 10/25/2005. Chest CT 07/25/2021. Abdominopelvic CT 09/18/2018 unavailable. FINDINGS: Lower chest: Cardiomegaly and atherosclerosis of the aorta and coronary arteries again noted. Trace pleural fluid on the right with patchy linear atelectasis or scarring in both lung bases, similar to recent chest CT. Hepatobiliary: No focal hepatic abnormalities are identified on noncontrast imaging. There is stable extrahepatic biliary dilatation  post cholecystectomy, likely physiologic. No evidence of calcified intraductal calculus. Pancreas: Mildly atrophy. No evidence of pancreatic mass, pancreatic ductal dilatation or surrounding inflammation. Spleen: Normal in size without focal abnormality. Adrenals/Urinary Tract: Stable mild prominence of both adrenal glands and a 1.7 cm low-density left adrenal nodule consistent with an adenoma. No suspicious adrenal nodularity. Mild renal cortical thinning and perinephric soft tissue stranding bilaterally, similar to previous studies. There is a tiny nonobstructing calculus in the lower pole of the left kidney. No evidence of ureteral or bladder calculus. There is no hydronephrosis. There is a probable small exophytic cyst projecting from the lower pole of the left kidney. The bladder appears moderately distended without focal abnormality. Stomach/Bowel: No enteric contrast administered. The stomach appears unremarkable for its degree of distension. No evidence of bowel wall thickening, distention or surrounding inflammatory change. The appendix appears normal. There are diverticular changes throughout the colon, greatest within the sigmoid colon. Vascular/Lymphatic: There are no enlarged abdominal or pelvic lymph nodes. Aortic and branch vessel atherosclerosis without aneurysm. Reproductive: Scattered uterine calcifications, likely due to calcified fibroids. No suspicious adnexal findings. Other: Previous ventral hernia repair without recurrent hernia, ascites or focal extraluminal fluid collection. Musculoskeletal: No acute or significant osseous findings. Grossly stable right paraspinal nodularity at L4-5. Mild multilevel lumbar spondylosis. Previous left total hip arthroplasty with moderate right hip degenerative changes. IMPRESSION: 1. Small nonobstructing left renal calculus. No evidence of ureteral calculus or hydronephrosis. 2. Moderate bladder distension without apparent focal abnormality. Chronic renal  cortical thinning and perinephric soft tissue stranding bilaterally. 3. Diffuse colonic diverticulosis without evidence of recurrent acute inflammation. 4. Stable additional incidental findings including chronic biliary dilatation post cholecystectomy, previous ventral hernia repair, scattered small calcified uterine fibroids and Aortic Atherosclerosis (ICD10-I70.0). Electronically Signed   By: Richardean Sale M.D.   On: 07/26/2021 15:00   VAS [place] LOWER EXTREMITY VENOUS (DVT) (ONLY [facility] & [facility])  Result Date: 07/26/2021  Lower Venous DVT Study Patient Name:  SHONDRA CAPPS Sedler  Date of Exam:   07/26/2021 Medical Rec #: [id number]  Accession #:    [id number] Date of Birth: 1945-11-23         Patient Gender: F Patient Age:   42 years Exam Location:  [facility] Procedure:      VAS [place] LOWER EXTREMITY VENOUS (DVT) Referring Phys: JOSEPH ZAMMIT --------------------------------------------------------------------------------  Indications: Pain.  Risk Factors: None identified. Limitations: Body habitus and poor ultrasound/tissue interface. Comparison Study: No prior studies. Performing Technologist: Oliver Hum RVT  Examination Guidelines: A complete evaluation includes B-mode imaging, spectral Doppler, color Doppler, and power Doppler as needed of all accessible portions of each vessel. Bilateral testing is considered an integral part of a complete examination. Limited examinations for reoccurring indications may be performed as noted. The reflux portion of the exam is performed with the patient in reverse Trendelenburg.  +---------+---------------+---------+-----------+----------+--------------+ RIGHT    CompressibilityPhasicitySpontaneityPropertiesThrombus Aging +---------+---------------+---------+-----------+----------+--------------+ CFV      Full           Yes      Yes                                 +---------+---------------+---------+-----------+----------+--------------+ SFJ       Full                                                        +---------+---------------+---------+-----------+----------+--------------+ FV Prox  Full                                                        +---------+---------------+---------+-----------+----------+--------------+ FV Mid                  Yes      Yes                                 +---------+---------------+---------+-----------+----------+--------------+ FV Distal               Yes      Yes                                 +---------+---------------+---------+-----------+----------+--------------+ PFV      Full                                                        +---------+---------------+---------+-----------+----------+--------------+ POP      Full           Yes      Yes                                 +---------+---------------+---------+-----------+----------+--------------+ PTV      Full                                                        +---------+---------------+---------+-----------+----------+--------------+  PERO     Full                                                        +---------+---------------+---------+-----------+----------+--------------+   +---------+---------------+---------+-----------+----------+--------------+ LEFT     CompressibilityPhasicitySpontaneityPropertiesThrombus Aging +---------+---------------+---------+-----------+----------+--------------+ CFV      Full           Yes      Yes                                 +---------+---------------+---------+-----------+----------+--------------+ SFJ      Full                                                        +---------+---------------+---------+-----------+----------+--------------+ FV Prox  Full                                                        +---------+---------------+---------+-----------+----------+--------------+ FV Mid   Full                                                         +---------+---------------+---------+-----------+----------+--------------+ FV DistalFull                                                        +---------+---------------+---------+-----------+----------+--------------+ PFV      Full                                                        +---------+---------------+---------+-----------+----------+--------------+ POP      Full           Yes      Yes                                 +---------+---------------+---------+-----------+----------+--------------+ PTV      Full                                                        +---------+---------------+---------+-----------+----------+--------------+ PERO     Full                                                        +---------+---------------+---------+-----------+----------+--------------+  Summary: RIGHT: - There is no evidence of deep vein thrombosis in the lower extremity. However, portions of this examination were limited- see technologist comments above.  - No cystic structure found in the popliteal fossa.  LEFT: - There is no evidence of deep vein thrombosis in the lower extremity. However, portions of this examination were limited- see technologist comments above.  - No cystic structure found in the popliteal fossa.  *See table(s) above for measurements and observations. Electronically signed by Deitra Mayo MD on 07/26/2021 at 1:56:12 PM.    Final       Subjective: Patient seen and examined at the bedside this morning.  Hemodynamically stable.  She was on room air.  She still complains of some flank pain when she moves but not at rest.  She is medically stable for discharge.  Discharge Exam: Vitals:   08/02/21 0527 08/02/21 0821  BP: (!) 127/52   Pulse: (!) 49 65  Resp: 14   Temp: 97.7 F (36.5 C)   SpO2: 92%    Vitals:   08/01/21 2021 08/02/21 0500 08/02/21 0527 08/02/21 0821  BP: (!) 117/54  (!) 127/52   Pulse: (!) 50  (!)  49 65  Resp: 20  14   Temp: 98.1 F (36.7 C)  97.7 F (36.5 C)   TempSrc: Oral  Oral   SpO2: 98%  92%   Weight:  91.3 kg    Height:        General: Pt is alert, awake, not in acute distress,obese Cardiovascular: RRR, S1/S2 +, no rubs, no gallops Respiratory: CTA bilaterally, no wheezing, no rhonchi Abdominal: Soft, NT, ND, bowel sounds + Extremities: no edema, no cyanosis    The results of significant diagnostics from this hospitalization (including imaging, microbiology, ancillary and laboratory) are listed below for reference.     Microbiology: Recent Results (from the past 240 hour(s))  Resp Panel by RT-PCR (Flu A&B, Covid) Nasopharyngeal Swab     Status: None   Collection Time: 07/26/21  1:00 PM   Specimen: Nasopharyngeal Swab; Nasopharyngeal(NP) swabs in vial transport medium  Result Value Ref Range Status   SARS Coronavirus 2 by RT PCR NEGATIVE NEGATIVE Final    Comment: (NOTE) SARS-CoV-2 target nucleic acids are NOT DETECTED.  The SARS-CoV-2 RNA is generally detectable in upper respiratory specimens during the acute phase of infection. The lowest concentration of SARS-CoV-2 viral copies this assay can detect is 138 copies/mL. A negative result does not preclude SARS-Cov-2 infection and should not be used as the sole basis for treatment or other patient management decisions. A negative result may occur with  improper specimen collection/handling, submission of specimen other than nasopharyngeal swab, presence of viral mutation(s) within the areas targeted by this assay, and inadequate number of viral copies(<138 copies/mL). A negative result must be combined with clinical observations, patient history, and epidemiological information. The expected result is Negative.  Fact Sheet for Patients:  EntrepreneurPulse.com.au  Fact Sheet for Healthcare Providers:  IncredibleEmployment.be  This test is no t yet approved or cleared by  the [place] FDA and  has been authorized for detection and/or diagnosis of SARS-CoV-2 by FDA under an Emergency Use Authorization (EUA). This EUA will remain  in effect (meaning this test can be used) for the duration of the COVID-19 declaration under Section 564(b)(1) of the Act, 21 U.S.C.section 360bbb-3(b)(1), unless the authorization is terminated  or revoked sooner.       Influenza A by PCR NEGATIVE NEGATIVE Final   Influenza  B by PCR NEGATIVE NEGATIVE Final    Comment: (NOTE) The Xpert Xpress SARS-CoV-2/FLU/RSV plus assay is intended as an aid in the diagnosis of influenza from Nasopharyngeal swab specimens and should not be used as a sole basis for treatment. Nasal washings and aspirates are unacceptable for Xpert Xpress SARS-CoV-2/FLU/RSV testing.  Fact Sheet for Patients: EntrepreneurPulse.com.au  Fact Sheet for Healthcare Providers: IncredibleEmployment.be  This test is not yet approved or cleared by the [place] FDA and has been authorized for detection and/or diagnosis of SARS-CoV-2 by FDA under an Emergency Use Authorization (EUA). This EUA will remain in effect (meaning this test can be used) for the duration of the COVID-19 declaration under Section 564(b)(1) of the Act, 21 U.S.C. section 360bbb-3(b)(1), unless the authorization is terminated or revoked.  Performed at [facility], [place] [address] [place], [place] [address]   Respiratory (~20 pathogens) panel by PCR     Status: None   Collection Time: 07/29/21  1:18 PM   Specimen: Nasopharyngeal Swab; Respiratory  Result Value Ref Range Status   Adenovirus NOT DETECTED NOT DETECTED Final   Coronavirus 229E NOT DETECTED NOT DETECTED Final    Comment: (NOTE) The Coronavirus on the Respiratory Panel, DOES NOT test for the novel  Coronavirus (2019 nCoV)    Coronavirus HKU1 NOT DETECTED NOT DETECTED Final   Coronavirus NL63 NOT DETECTED NOT  DETECTED Final   Coronavirus OC43 NOT DETECTED NOT DETECTED Final   Metapneumovirus NOT DETECTED NOT DETECTED Final   Rhinovirus / Enterovirus NOT DETECTED NOT DETECTED Final   Influenza A NOT DETECTED NOT DETECTED Final   Influenza B NOT DETECTED NOT DETECTED Final   Parainfluenza Virus 1 NOT DETECTED NOT DETECTED Final   Parainfluenza Virus 2 NOT DETECTED NOT DETECTED Final   Parainfluenza Virus 3 NOT DETECTED NOT DETECTED Final   Parainfluenza Virus 4 NOT DETECTED NOT DETECTED Final   Respiratory Syncytial Virus NOT DETECTED NOT DETECTED Final   Bordetella pertussis NOT DETECTED NOT DETECTED Final   Bordetella Parapertussis NOT DETECTED NOT DETECTED Final   Chlamydophila pneumoniae NOT DETECTED NOT DETECTED Final   Mycoplasma pneumoniae NOT DETECTED NOT DETECTED Final    Comment: Performed at [facility], [place]. [address] [place], [place] [address]  Expectorated Sputum Assessment w Gram Stain, Rflx to Resp Cult     Status: None   Collection Time: 07/30/21  4:52 PM   Specimen: Sputum  Result Value Ref Range Status   Specimen Description SPUTUM  Final   Special Requests NONE  Final   Sputum evaluation   Final    Sputum specimen not acceptable for testing.  Please recollect.   CALLED OIGHTSEY, S ON 07/30/2021 AT 1601 Performed at [facility], [place] [address], [place], [place] [address]    Report Status 07/30/2021 FINAL  Final     Labs: BNP (last 3 results) Recent Labs    07/26/21 1653 07/29/21 1200  BNP 378.9* 379.0*   Basic Metabolic Panel: Recent Labs  Lab 07/29/21 0339 07/30/21 0346 07/31/21 0355 08/01/21 0357 08/02/21 0348  NA 138 137 138 137 135  K 4.3 4.4 4.0 4.3 4.1  CL 103 100 99 98 95*  CO2 29 29 31 31  33*  GLUCOSE 130* 139* 151* 128* 143*  BUN 63* 67* 73* 66* 76*  CREATININE 3.73* 3.64* 3.29* 3.28* 3.36*  CALCIUM 8.8* 8.7* 8.8* 9.1 9.1  PHOS  --   --  5.0* 4.5 4.1   Liver Function Tests:  Recent Labs  Lab 07/31/21 0355  08/01/21 0357 08/02/21 0348  ALBUMIN 3.3* 3.4* 3.5   No results for input(s): LIPASE, AMYLASE in the last 168 hours. No results for input(s): AMMONIA in the last 168 hours. CBC: Recent Labs  Lab 07/27/21 0408 07/29/21 1200 07/30/21 0346 08/02/21 0348  WBC 7.0 4.6 4.4 5.7  NEUTROABS  --   --  2.7  --   HGB 10.6* 9.5* 9.5* 10.2*  HCT 35.6* 31.6* 31.0* 32.8*  MCV 94.9 96.0 93.4 92.1  PLT 157 162 142* 168   Cardiac Enzymes: No results for input(s): CKTOTAL, CKMB, CKMBINDEX, TROPONINI in the last 168 hours. BNP: Invalid input(s): POCBNP CBG: Recent Labs  Lab 08/01/21 1137 08/01/21 1649 08/01/21 2134 08/02/21 0736 08/02/21 1139  GLUCAP 189* 152* 159* 141* 291*   D-Dimer No results for input(s): DDIMER in the last 72 hours. Hgb A1c No results for input(s): HGBA1C in the last 72 hours. Lipid Profile No results for input(s): CHOL, HDL, LDLCALC, TRIG, CHOLHDL, LDLDIRECT in the last 72 hours. Thyroid function studies No results for input(s): TSH, T4TOTAL, T3FREE, THYROIDAB in the last 72 hours.  Invalid input(s): FREET3 Anemia work up Recent Labs    08/02/21 0348  FERRITIN 38  TIBC 331  IRON 39   Urinalysis    Component Value Date/Time   COLORURINE YELLOW 07/26/2021 0100   APPEARANCEUR CLEAR 07/26/2021 0100   LABSPEC 1.012 07/26/2021 0100   PHURINE 5.0 07/26/2021 0100   GLUCOSEU NEGATIVE 07/26/2021 0100   HGBUR NEGATIVE 07/26/2021 0100   BILIRUBINUR NEGATIVE 07/26/2021 0100   KETONESUR NEGATIVE 07/26/2021 0100   PROTEINUR 100 (A) 07/26/2021 0100   UROBILINOGEN 0.2 11/18/2014 1145   NITRITE NEGATIVE 07/26/2021 0100   LEUKOCYTESUR NEGATIVE 07/26/2021 0100   Sepsis Labs Invalid input(s): PROCALCITONIN,  WBC,  LACTICIDVEN Microbiology Recent Results (from the past 240 hour(s))  Resp Panel by RT-PCR (Flu A&B, Covid) Nasopharyngeal Swab     Status: None   Collection Time: 07/26/21  1:00 PM   Specimen: Nasopharyngeal Swab; Nasopharyngeal(NP) swabs in vial  transport medium  Result Value Ref Range Status   SARS Coronavirus 2 by RT PCR NEGATIVE NEGATIVE Final    Comment: (NOTE) SARS-CoV-2 target nucleic acids are NOT DETECTED.  The SARS-CoV-2 RNA is generally detectable in upper respiratory specimens during the acute phase of infection. The lowest concentration of SARS-CoV-2 viral copies this assay can detect is 138 copies/mL. A negative result does not preclude SARS-Cov-2 infection and should not be used as the sole basis for treatment or other patient management decisions. A negative result may occur with  improper specimen collection/handling, submission of specimen other than nasopharyngeal swab, presence of viral mutation(s) within the areas targeted by this assay, and inadequate number of viral copies(<138 copies/mL). A negative result must be combined with clinical observations, patient history, and epidemiological information. The expected result is Negative.  Fact Sheet for Patients:  EntrepreneurPulse.com.au  Fact Sheet for Healthcare Providers:  IncredibleEmployment.be  This test is no t yet approved or cleared by the [place] FDA and  has been authorized for detection and/or diagnosis of SARS-CoV-2 by FDA under an Emergency Use Authorization (EUA). This EUA will remain  in effect (meaning this test can be used) for the duration of the COVID-19 declaration under Section 564(b)(1) of the Act, 21 U.S.C.section 360bbb-3(b)(1), unless the authorization is terminated  or revoked sooner.       Influenza A by PCR NEGATIVE NEGATIVE Final   Influenza B by PCR NEGATIVE  NEGATIVE Final    Comment: (NOTE) The Xpert Xpress SARS-CoV-2/FLU/RSV plus assay is intended as an aid in the diagnosis of influenza from Nasopharyngeal swab specimens and should not be used as a sole basis for treatment. Nasal washings and aspirates are unacceptable for Xpert Xpress SARS-CoV-2/FLU/RSV testing.  Fact  Sheet for Patients: EntrepreneurPulse.com.au  Fact Sheet for Healthcare Providers: IncredibleEmployment.be  This test is not yet approved or cleared by the [place] FDA and has been authorized for detection and/or diagnosis of SARS-CoV-2 by FDA under an Emergency Use Authorization (EUA). This EUA will remain in effect (meaning this test can be used) for the duration of the COVID-19 declaration under Section 564(b)(1) of the Act, 21 U.S.C. section 360bbb-3(b)(1), unless the authorization is terminated or revoked.  Performed at [facility], [place] [address], [place], [place] [address]   Respiratory (~20 pathogens) panel by PCR     Status: None   Collection Time: 07/29/21  1:18 PM   Specimen: Nasopharyngeal Swab; Respiratory  Result Value Ref Range Status   Adenovirus NOT DETECTED NOT DETECTED Final   Coronavirus 229E NOT DETECTED NOT DETECTED Final    Comment: (NOTE) The Coronavirus on the Respiratory Panel, DOES NOT test for the novel  Coronavirus (2019 nCoV)    Coronavirus HKU1 NOT DETECTED NOT DETECTED Final   Coronavirus NL63 NOT DETECTED NOT DETECTED Final   Coronavirus OC43 NOT DETECTED NOT DETECTED Final   Metapneumovirus NOT DETECTED NOT DETECTED Final   Rhinovirus / Enterovirus NOT DETECTED NOT DETECTED Final   Influenza A NOT DETECTED NOT DETECTED Final   Influenza B NOT DETECTED NOT DETECTED Final   Parainfluenza Virus 1 NOT DETECTED NOT DETECTED Final   Parainfluenza Virus 2 NOT DETECTED NOT DETECTED Final   Parainfluenza Virus 3 NOT DETECTED NOT DETECTED Final   Parainfluenza Virus 4 NOT DETECTED NOT DETECTED Final   Respiratory Syncytial Virus NOT DETECTED NOT DETECTED Final   Bordetella pertussis NOT DETECTED NOT DETECTED Final   Bordetella Parapertussis NOT DETECTED NOT DETECTED Final   Chlamydophila pneumoniae NOT DETECTED NOT DETECTED Final   Mycoplasma pneumoniae NOT DETECTED NOT DETECTED Final     Comment: Performed at [facility], [place]. [address] [place], [place] [address]  Expectorated Sputum Assessment w Gram Stain, Rflx to Resp Cult     Status: None   Collection Time: 07/30/21  4:52 PM   Specimen: Sputum  Result Value Ref Range Status   Specimen Description SPUTUM  Final   Special Requests NONE  Final   Sputum evaluation   Final    Sputum specimen not acceptable for testing.  Please recollect.   CALLED OIGHTSEY, S ON 07/30/2021 AT 1601 Performed at [facility], [place] [address], [place],  [address]    Report Status 07/30/2021 FINAL  Final    Please note: You were cared for by a hospitalist during your hospital stay. Once you are discharged, your primary care physician will handle any further medical issues. Please note that NO REFILLS for any discharge medications will be authorized once you are discharged, as it is imperative that you return to your primary care physician (or establish a relationship with a primary care physician if you do not have one) for your post hospital discharge needs so that they can reassess your need for medications and monitor your lab values.    Time coordinating discharge: 40 minutes  SIGNED:   Shelly Coss, MD  [facility] 08/02/2021, 12:02 PM Pager [phone]  If 7PM-7AM, please contact night-coverage [email or web address] Password [email or web address]

## 2021-08-02 NOTE — Progress Notes (Signed)
[facility] Progress Note  Subjective: UOP 2.4 L yest and creat stable 3.3 today-  BUN up from 66 to 76. Getting IV lasix 40 bid-  weight down as well-  she is off of O2- there is a discharge summary in the chart  Vitals:   07/29/21 2130 07/29/21 2145 07/29/21 2159 07/30/21 0531  BP:    (!) 123/59  Pulse:   88 62  Resp: 13 15  18   Temp:    97.9 F (36.6 C)  TempSrc:    Oral  SpO2:    99%  Weight:    95.2 kg  Height:        Exam: General: WDWN NAD-  slightly slow mentally it seems  Lungs: Decreased breath sounds bilaterally. Breathing is unlabored. Heart: RRR. No murmur, rubs or gallops, no JVD.  Abdomen: soft, nontender, nondistended, +BS, M/S:  Equal strength b/l in upper and lower extremities.  Lower extremities: 1+ pretibial edema in LLE, 2+ pretibial and hip edema in RLE.  Right wrist AVF-  good thrill and bruit   UA 11/15 - 100 prot, rare bact, 0-5 rbc, 6-10 wbc   UNa 102, UCr 33 (on lasix)   CT abd 11/17 - Urinary Tract: Mild renal cortical atrophy as seen previously. 2 mm nonobstructing stone in the lower pole of the left kidney. No hydronephrosis or passing stone. No stone in the bladder.  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 Creat 2.2 at baseline on admission when pt came in w/ SOB and hypoxemia but was overloaded. UA unremarkable, no obstruction by CT abdomen. Had initial good UOP response w/ IV lasix 80 mg bid, but creat ^'d to 2.9 and then 3.8.  Held diuretics for 1 day then resumed at lower dose 40mg  IV bid on 11/19. Seen by CCM who felt most likely this is vol overload/pulm edema. CT w/o other findings. Diuresing well still and creat at least stable with no uremic sxms. Feeling better, breathing better, O2 support weaning down. Per pmd if continues to improve can be dc'd to rehab / SNF in 1-2 days.  At the time of discharge can change over to lasix PO 40 BID and then her nephrologist can fine tune as an OP.  I dont think her GFR has  changed much -  think the appropriate diuresis unmasked where her true CKD is.  Acute respiratory failure with hypoxia- vasc congestion on initial CXR.  BNP 378.9 on 11/15. Improved with diuresis Volume overload- +leg edema, hypoxia presumed due to [place] water. Pt had been pushing fluids at home. improved Hypertension- BP were soft here so pt's hydralazine and losartan were stopped and metoprolol (afib) was lowered to 12.5 bid. BP's better now, 130- 140 range SBP.  Hx of PAF- bradycardia during encounter. Hold parameters on metoprolol. 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  Hx of CVA- some right sided residual weakness, uses a cane. Continue Aspirin. Hyperlipidemia- Continue Simvastatin Anemia-  some with hgb in the 9's-  will check tomorrow as well as iron stores-  up to 10.2-  iron is low but now leaving so I cannot give IV iron-  will forward to her nephrologist     Louis Meckel  07/31/2021, 7:59 AM

## 2022-01-10 IMAGING — CT CT ABD-PELV W/O CM
2 of 4 series · 17 of 46 positions shown, 19 images · non-contrast
Comparison: 07/26/2021

CLINICAL DATA: Acute nonlocalized abdominal pain. Persistent
unexplained left flank pain.

EXAM:
CT ABDOMEN AND PELVIS WITHOUT CONTRAST
TECHNIQUE: Multidetector CT imaging of the abdomen and pelvis was performed
following the standard protocol without IV contrast.

[Series 2: axial (person_name) · axial · 0.98mm/px · z∈[-692,-272]mm · 14 of 94 slices shown, 16 images]
[im 5/94  soft-tissue]
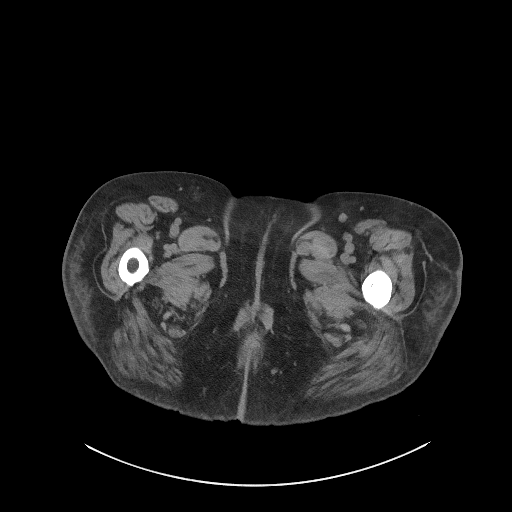
[im 5/94  bone]
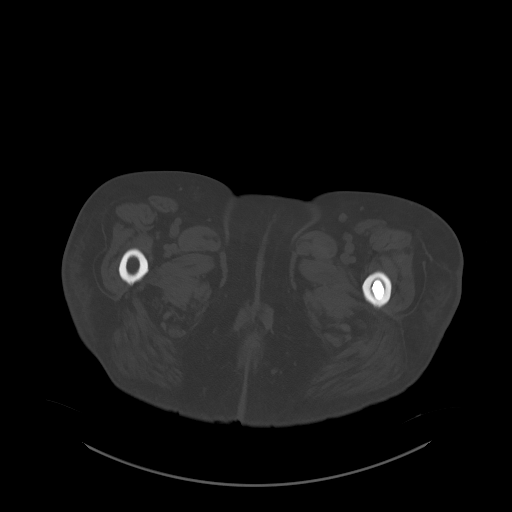
[im 10/94  soft-tissue]
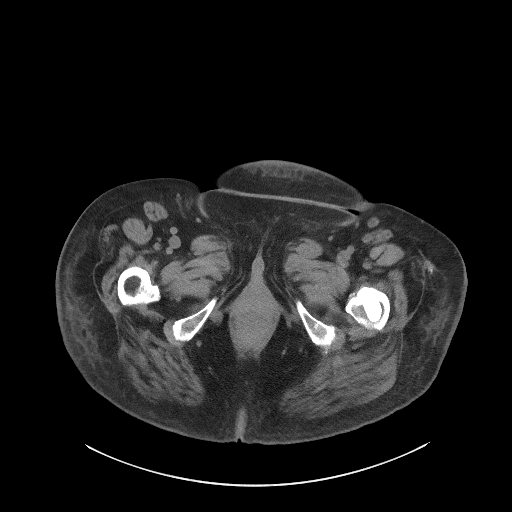
[im 20/94  soft-tissue]
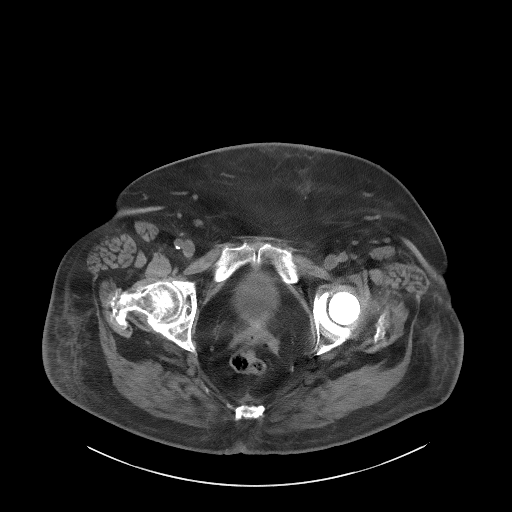
[im 25/94  soft-tissue]
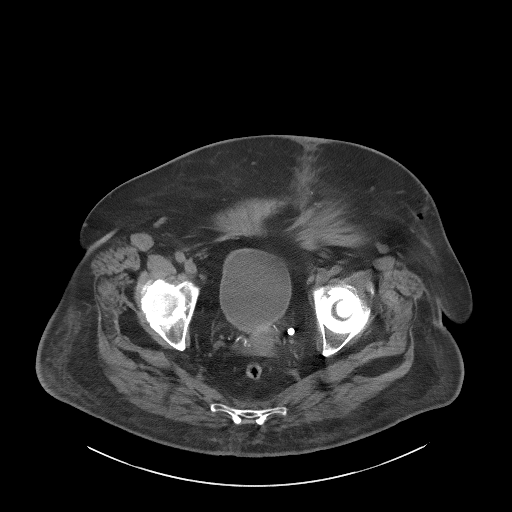
[im 30/94  soft-tissue]
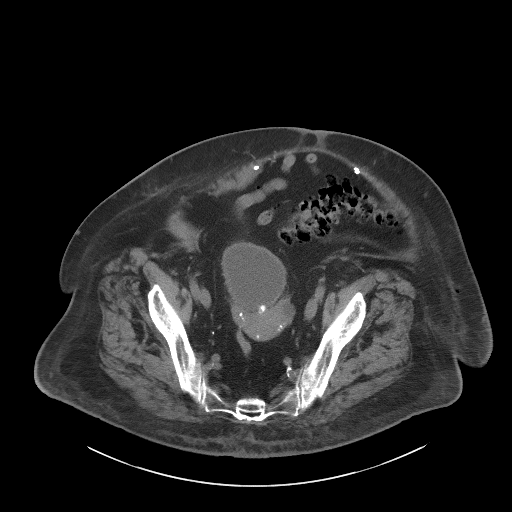
[im 40/94  soft-tissue]
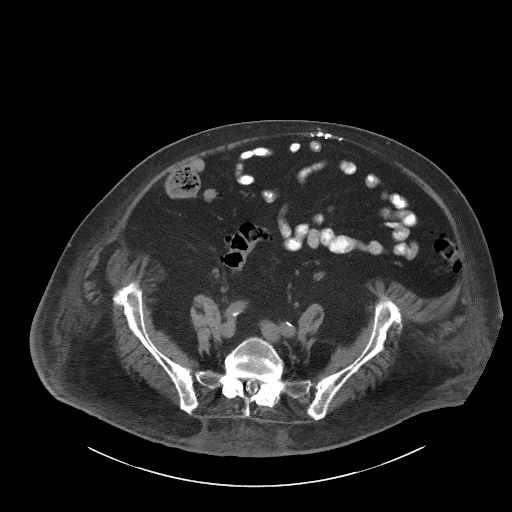
[im 45/94  soft-tissue]
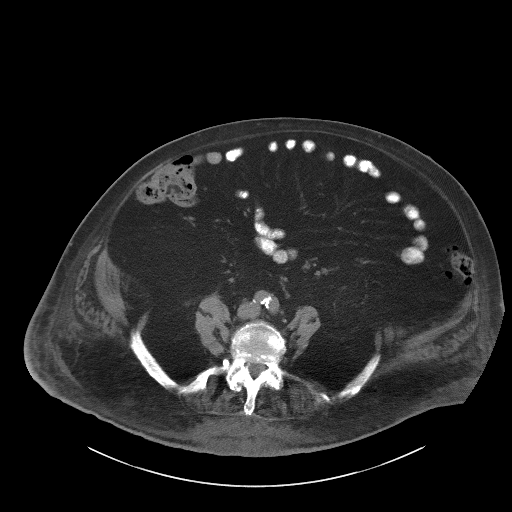
[im 49/94  soft-tissue]
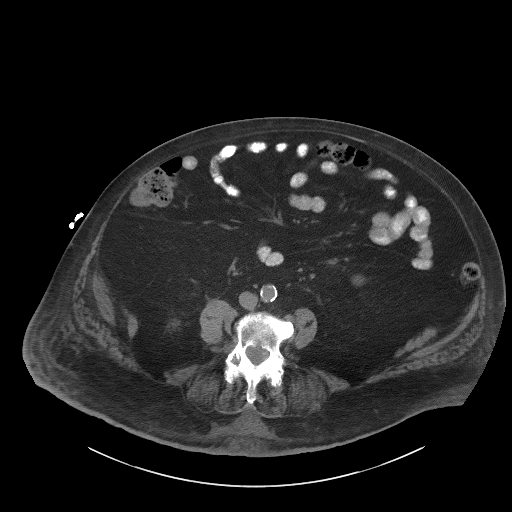
[im 54/94  soft-tissue]
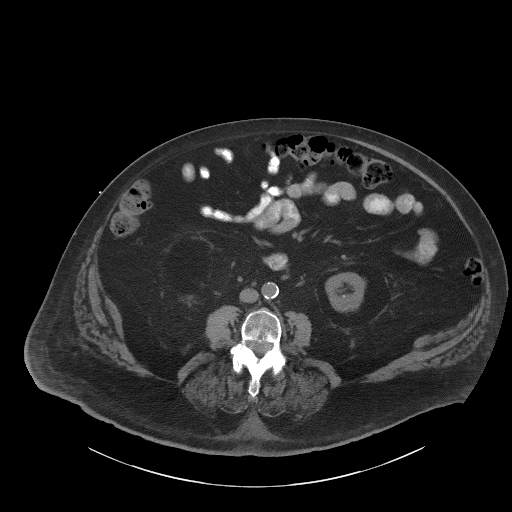
[im 54/94  bone]
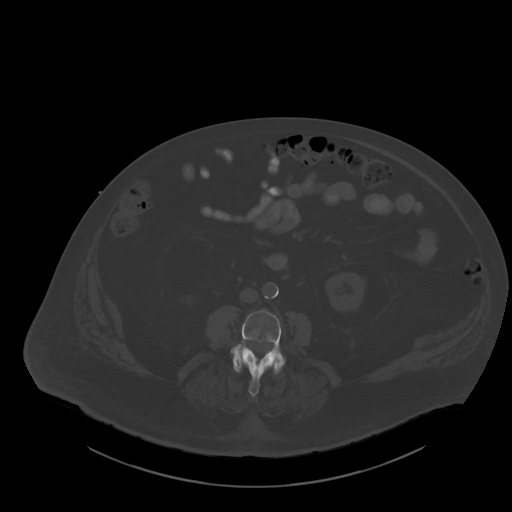
[im 64/94  soft-tissue]
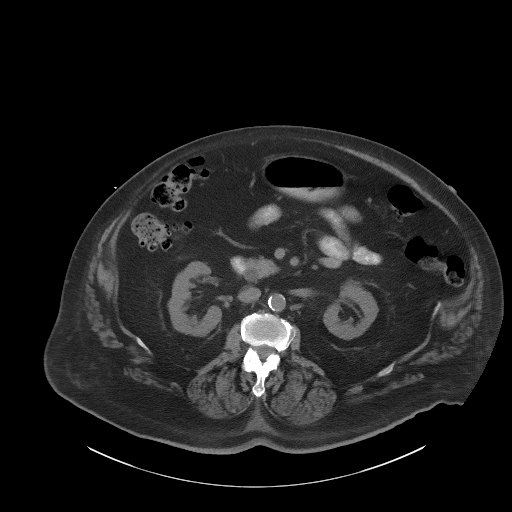
[im 69/94  soft-tissue]
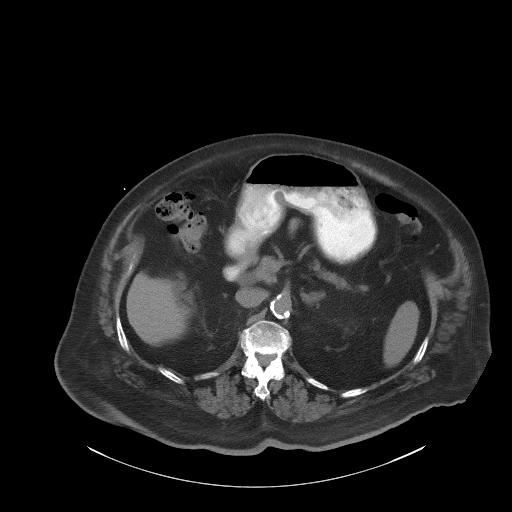
[im 74/94  soft-tissue]
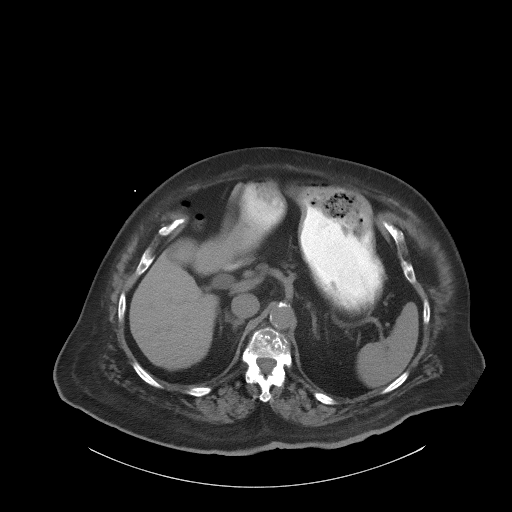
[im 84/94  soft-tissue]
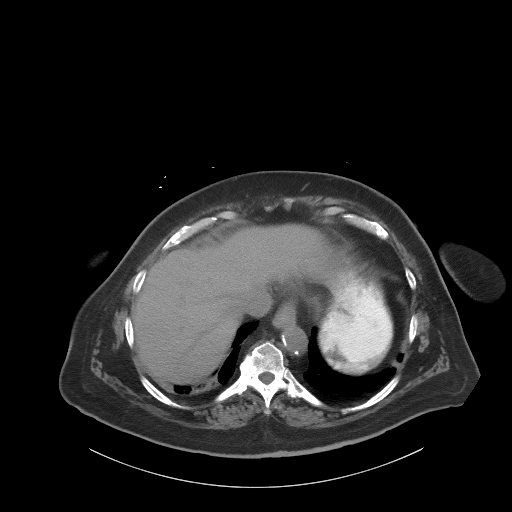
[im 89/94  soft-tissue]
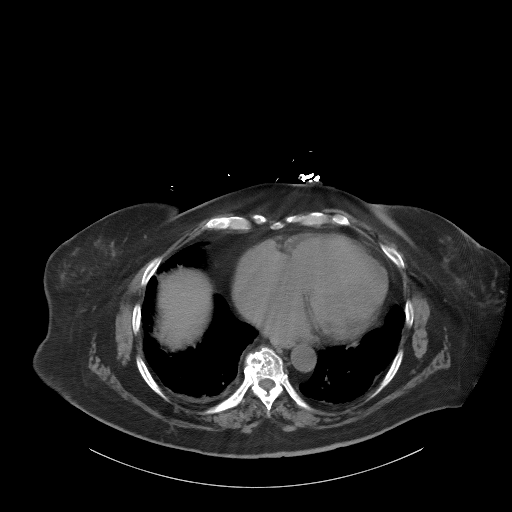

[Series 5: coronal (person_name) · coronal · 1.05mm/px · 3 of 124 slices shown]
[im 42/124  soft-tissue]
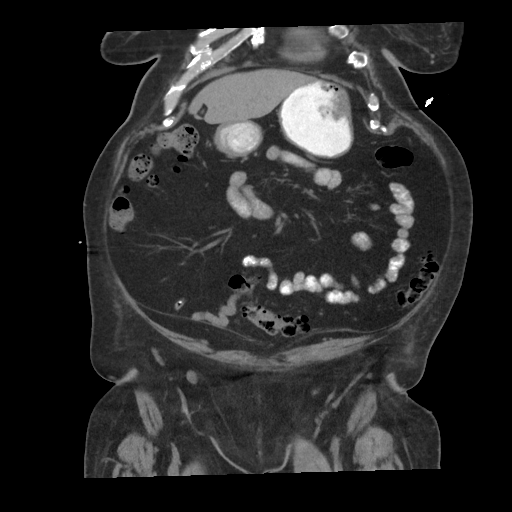
[im 55/124  soft-tissue]
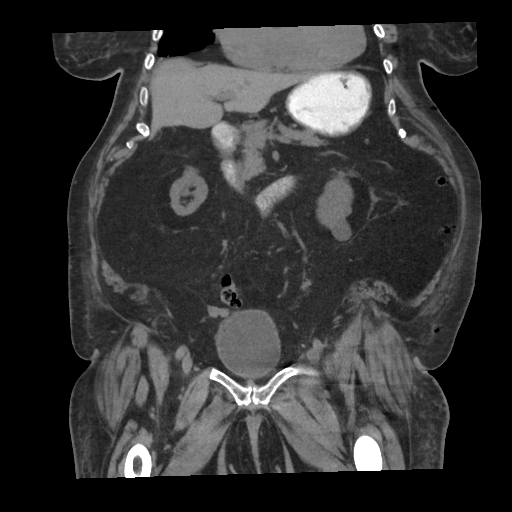
[im 69/124  soft-tissue]
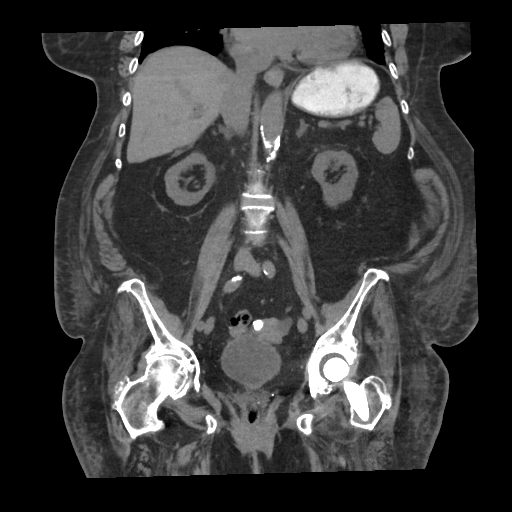

[17 of 46 positions shown; findings below may reference images not displayed]

FINDINGS: Lower chest: Tiny amount of pleural fluid on the right. Patchy
density at the lung bases could be atelectasis or scarring.
Cardiomegaly again seen with coronary artery calcification.

Hepatobiliary: Liver parenchyma appears normal without contrast.
Previous cholecystectomy.

Pancreas: Normal

Spleen: Normal

Adrenals/Urinary Tract: 1.5 cm left adrenal adenoma as seen
previously. Mild renal cortical atrophy as seen previously. 2 mm
nonobstructing stone in the lower pole of the left kidney. No
hydronephrosis or passing stone. No stone in the bladder.

Stomach/Bowel: Stomach and small intestine appear normal. Normal
appendix. Diverticulosis of the colon without evidence of
diverticulitis.

Vascular/Lymphatic: Aortic atherosclerosis. No aneurysm. IVC is
normal. No adenopathy.

Reproductive: Calcified leiomyomas.  No significant pelvic finding.

Other: No free fluid or air.

Musculoskeletal: Previous ventral hernia repair. Ordinary chronic
degenerative changes of the spine. Previous hip replacement on the
left. Osteoarthritis of the right hip.
IMPRESSION: No change or acute finding.

2 mm nonobstructing stone in the lower pole of the left kidney.

Diverticulosis of the colon without evidence of diverticulitis.

Aortic Atherosclerosis ([id number]).

## 2022-01-11 IMAGING — CT CT CHEST W/O CM
2 of 4 series · 15 of 36 positions shown, 18 images · non-contrast
Comparison: Chest CT dated 07/25/2021.

CLINICAL DATA: Chest pain and shortness of breath.

EXAM:
CT CHEST WITHOUT CONTRAST
TECHNIQUE: Multidetector CT imaging of the chest was performed following the
standard protocol without IV contrast.

[Series 2: thorax · axial · 0.74mm/px · z∈[-523,-241]mm · 12 of 163 slices shown, 15 images]
[im 11/163  mediastinal]
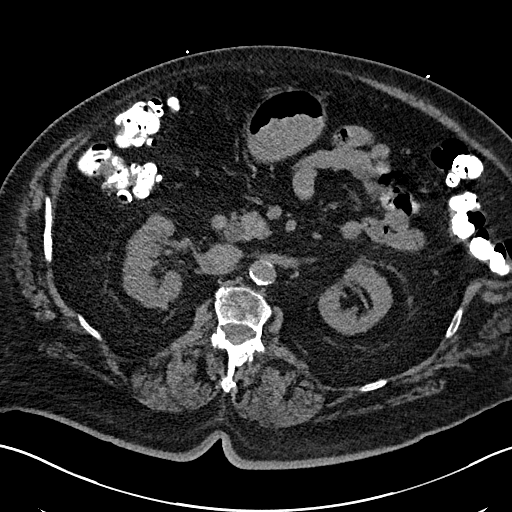
[im 11/163  lung]
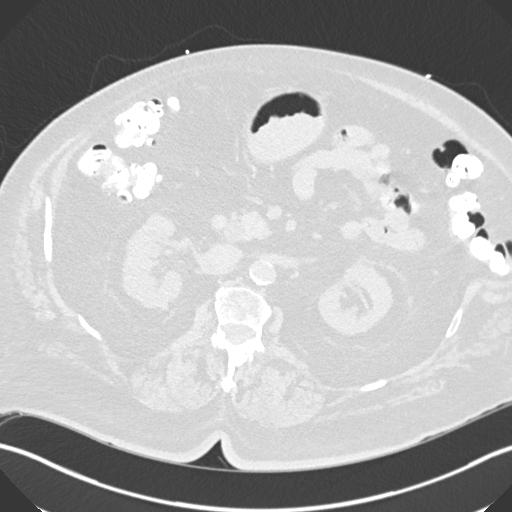
[im 22/163  lung]
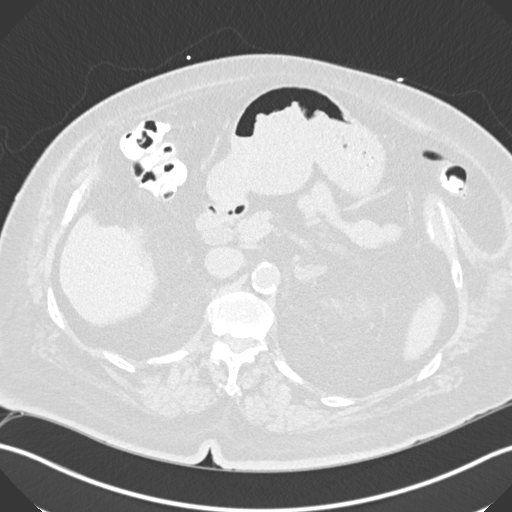
[im 33/163  lung]
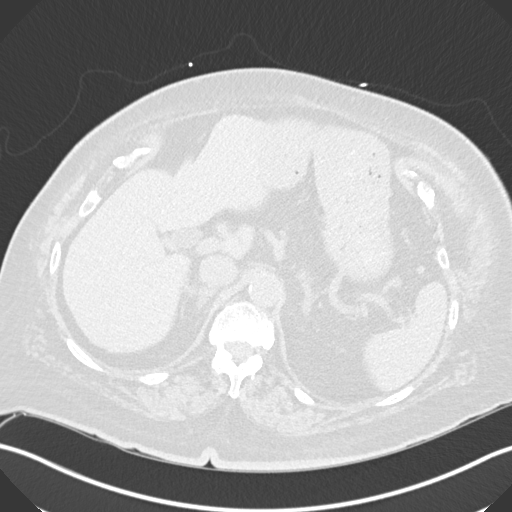
[im 55/163  lung]
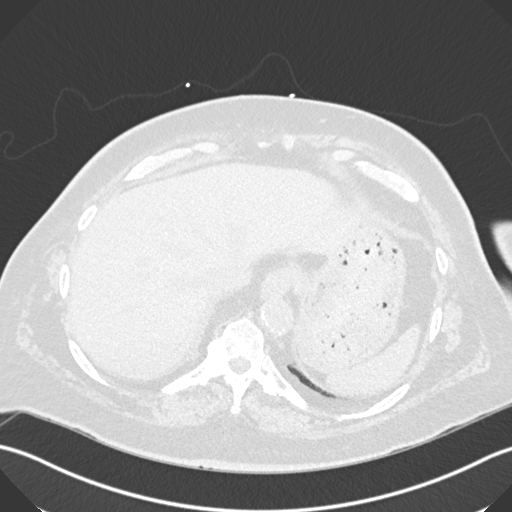
[im 65/163  mediastinal]
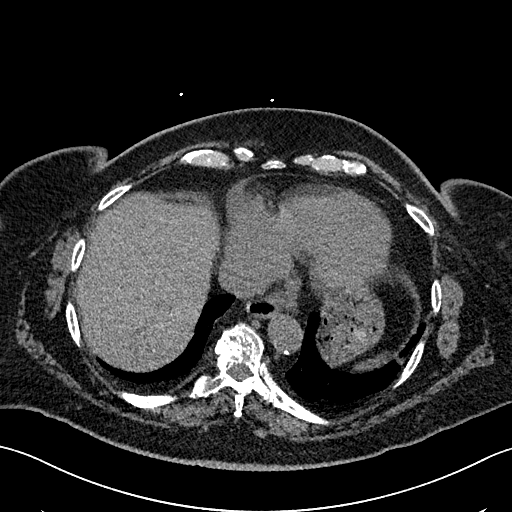
[im 65/163  lung]
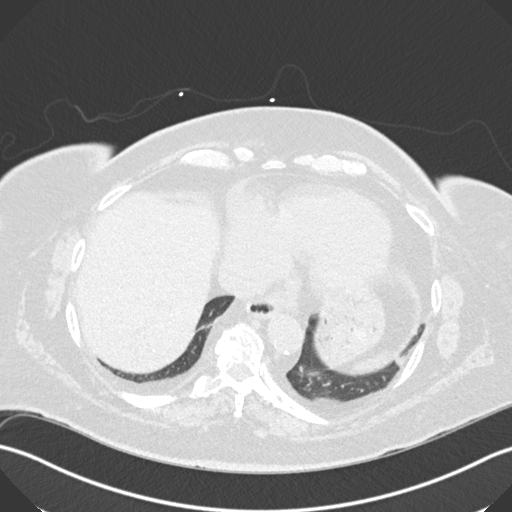
[im 76/163  lung]
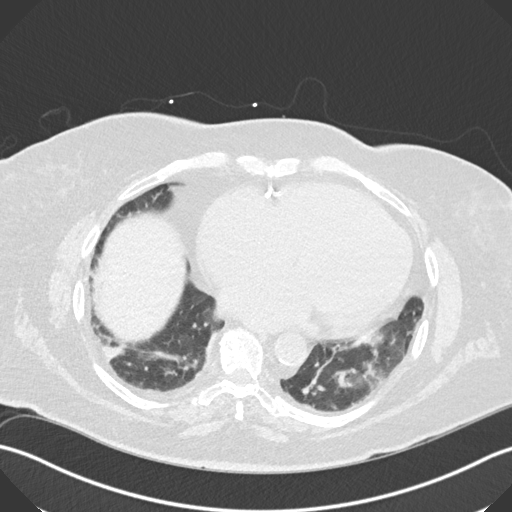
[im 87/163  lung]
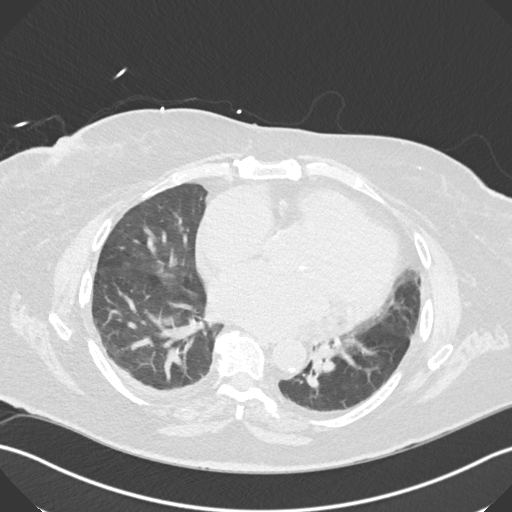
[im 98/163  lung]
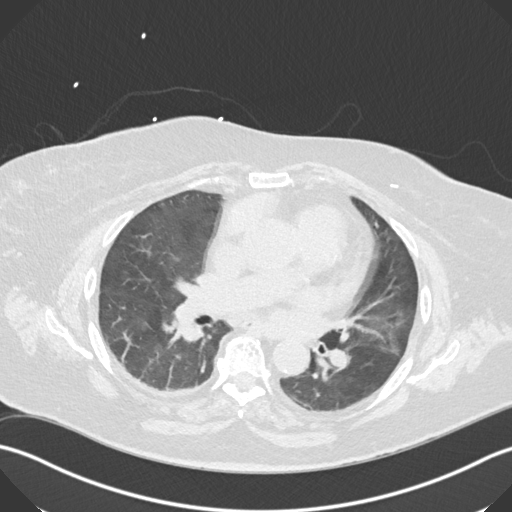
[im 109/163  mediastinal]
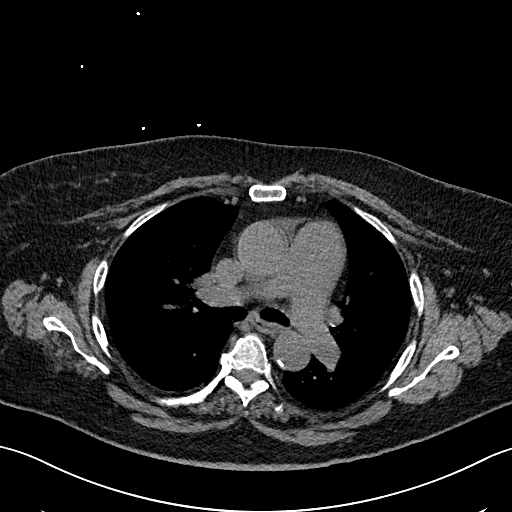
[im 109/163  lung]
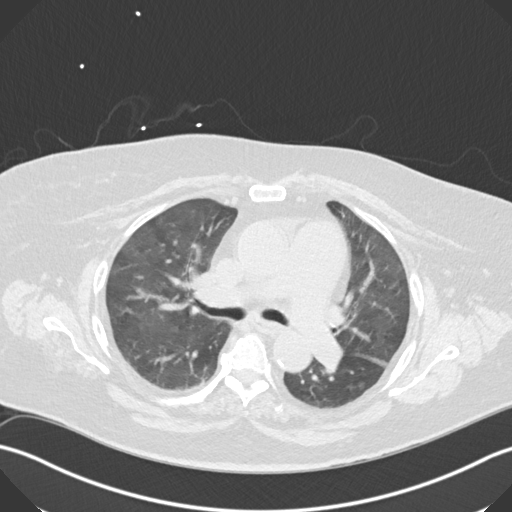
[im 130/163  lung]
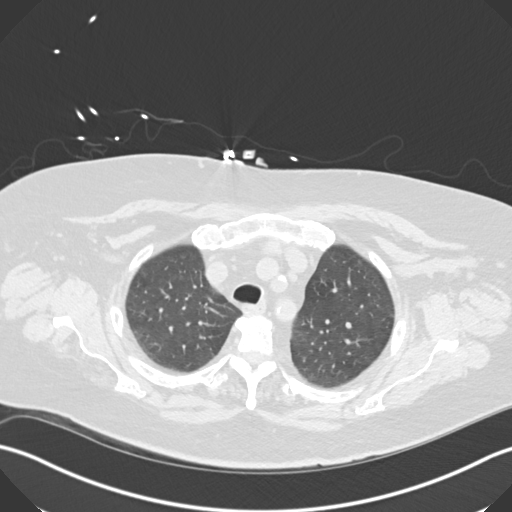
[im 141/163  lung]
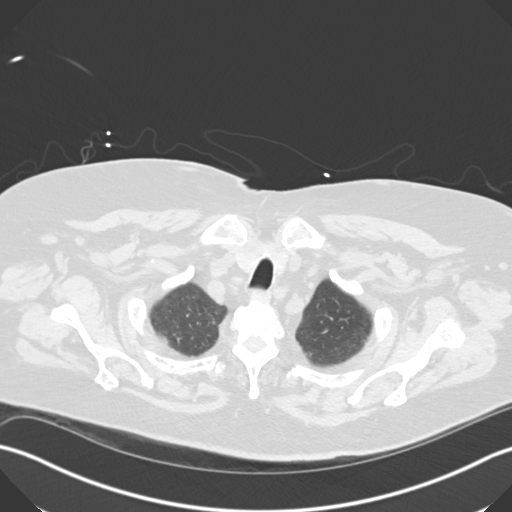
[im 152/163  lung]
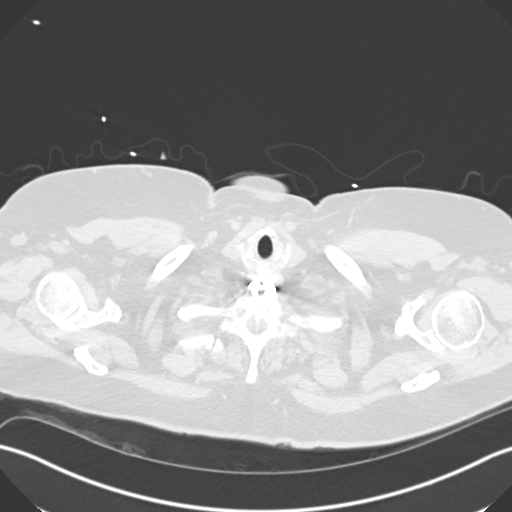

[Series 5: coronal · coronal · 0.59mm/px · 3 of 130 slices shown]
[im 26/130  lung]
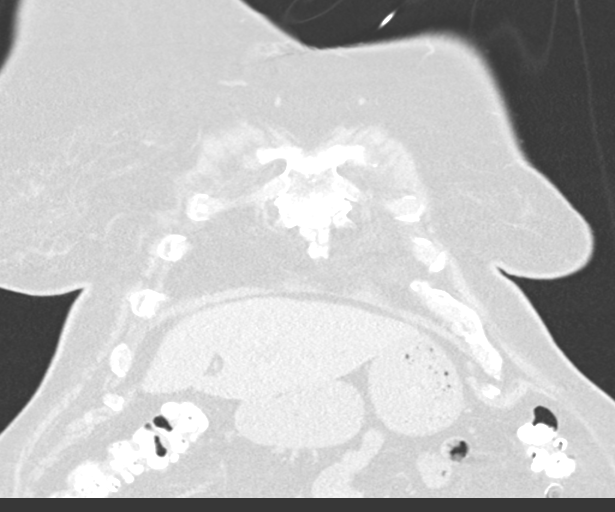
[im 52/130  lung]
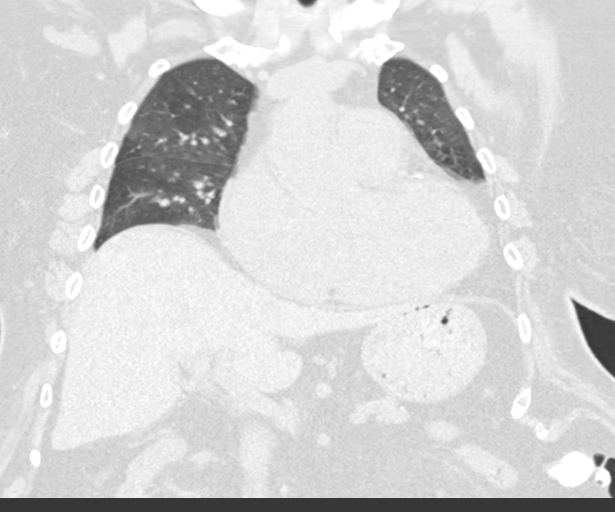
[im 78/130  lung]
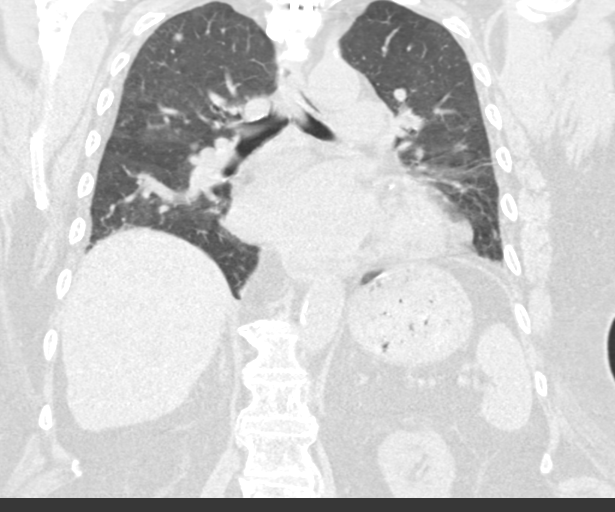

[15 of 36 positions shown; findings below may reference images not displayed]

FINDINGS: Evaluation of this exam is limited in the absence of intravenous
contrast.

Cardiovascular: Mild cardiomegaly. No pericardial effusion.
Three-vessel coronary vascular calcification. Moderate
atherosclerotic calcification of the thoracic aorta. No aneurysmal
dilatation. There is mild dilatation of the main pulmonary trunk
suggestive of pulmonary hypertension. Clinical correlation is
recommended.

Mediastinum/Nodes: No hilar or mediastinal adenopathy. The esophagus
and the thyroid gland are grossly unremarkable. No mediastinal fluid
collection.

Lungs/Pleura: Probable trace right pleural effusion versus pleural
thickening. There are bibasilar linear atelectasis/scarring.
Scattered ground-glass nodular densities predominantly involving the
upper lobes similar to the prior CT suspicious for an infectious
process. Clinical correlation and follow-up to resolution after
treatment recommended. No pneumothorax. The central airways are
patent.

Upper Abdomen: Left adrenal thickening/hyperplasia. Colonic
diverticulosis.

Musculoskeletal: Osteopenia with degenerative changes of the spine.
No acute osseous pathology. Lower cervical ACDF.
IMPRESSION: 1. Scattered ground-glass nodular densities predominantly involving
the upper lobes similar to the prior CT suspicious for an infectious
process. Clinical correlation and follow-up to resolution after
treatment recommended.
2. Aortic Atherosclerosis ([id number]).

## 2024-01-23 ENCOUNTER — Encounter (HOSPITAL_COMMUNITY): Payer: Self-pay

## 2024-01-31 ENCOUNTER — Telehealth (HOSPITAL_COMMUNITY): Payer: Self-pay

## 2024-01-31 NOTE — Telephone Encounter (Signed)
 Called Dr. Durand Gift office to get an order for this patient to have a fistulagram. Will schedule once order is received. AB
